# Patient Record
Sex: Male | Born: 1985 | Race: Black or African American | Hispanic: No | Marital: Single | Smoking: Former smoker
Health system: Southern US, Community
[De-identification: ages and names within clinical notes are randomized; demographics above are authoritative.]

## PROBLEM LIST (undated history)

## (undated) DIAGNOSIS — I10 Essential (primary) hypertension: Secondary | ICD-10-CM

## (undated) DIAGNOSIS — Z720 Tobacco use: Secondary | ICD-10-CM

## (undated) DIAGNOSIS — J189 Pneumonia, unspecified organism: Secondary | ICD-10-CM

## (undated) DIAGNOSIS — J40 Bronchitis, not specified as acute or chronic: Secondary | ICD-10-CM

---
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
## 2006-07-07 ENCOUNTER — Emergency Department: Payer: Self-pay | Admitting: Emergency Medicine

## 2006-07-08 ENCOUNTER — Other Ambulatory Visit: Payer: Self-pay

## 2007-05-26 ENCOUNTER — Emergency Department: Payer: Self-pay | Admitting: Unknown Physician Specialty

## 2007-08-08 ENCOUNTER — Emergency Department: Payer: Self-pay

## 2008-03-13 ENCOUNTER — Emergency Department: Payer: Self-pay | Admitting: Emergency Medicine

## 2009-09-21 ENCOUNTER — Emergency Department: Payer: Self-pay | Admitting: Emergency Medicine

## 2011-01-05 ENCOUNTER — Emergency Department: Payer: Self-pay | Admitting: Emergency Medicine

## 2014-07-03 ENCOUNTER — Emergency Department: Payer: Self-pay | Admitting: Emergency Medicine

## 2015-11-09 ENCOUNTER — Emergency Department
Admission: EM | Admit: 2015-11-09 | Discharge: 2015-11-09 | Disposition: A | Discharge status: Home / Self Care | Payer: Self-pay | Attending: Emergency Medicine | Admitting: Emergency Medicine

## 2015-11-09 ENCOUNTER — Emergency Department: Payer: Self-pay

## 2015-11-09 ENCOUNTER — Encounter: Payer: Self-pay | Admitting: Emergency Medicine

## 2015-11-09 DIAGNOSIS — R61 Generalized hyperhidrosis: Secondary | ICD-10-CM | POA: Insufficient documentation

## 2015-11-09 DIAGNOSIS — J069 Acute upper respiratory infection, unspecified: Secondary | ICD-10-CM | POA: Insufficient documentation

## 2015-11-09 DIAGNOSIS — E669 Obesity, unspecified: Secondary | ICD-10-CM | POA: Insufficient documentation

## 2015-11-09 DIAGNOSIS — F17200 Nicotine dependence, unspecified, uncomplicated: Secondary | ICD-10-CM | POA: Insufficient documentation

## 2015-11-09 DIAGNOSIS — R55 Syncope and collapse: Secondary | ICD-10-CM | POA: Insufficient documentation

## 2015-11-09 HISTORY — DX: Bronchitis, not specified as acute or chronic: J40

## 2015-11-09 LAB — CBC
HCT: 49.8 % (ref 40.0–52.0)
Hemoglobin: 16 g/dL (ref 13.0–18.0)
MCH: 27.3 pg (ref 26.0–34.0)
MCHC: 32.1 g/dL (ref 32.0–36.0)
MCV: 85.1 fL (ref 80.0–100.0)
PLATELETS: 254 10*3/uL (ref 150–440)
RBC: 5.85 MIL/uL (ref 4.40–5.90)
RDW: 13.3 % (ref 11.5–14.5)
WBC: 6.8 10*3/uL (ref 3.8–10.6)

## 2015-11-09 LAB — BASIC METABOLIC PANEL
Anion gap: 3 — ABNORMAL LOW (ref 5–15)
BUN: 13 mg/dL (ref 6–20)
CHLORIDE: 106 mmol/L (ref 101–111)
CO2: 29 mmol/L (ref 22–32)
CREATININE: 1.16 mg/dL (ref 0.61–1.24)
Calcium: 9 mg/dL (ref 8.9–10.3)
GFR calc non Af Amer: >60 mL/min (ref >60)
Glucose, Bld: 96 mg/dL (ref 65–99)
Potassium: 3.8 mmol/L (ref 3.5–5.1)
SODIUM: 138 mmol/L (ref 135–145)

## 2015-11-09 MED ORDER — BENZONATATE 100 MG PO CAPS: 100.0000 mg | ORAL_CAPSULE | Freq: Three times a day (TID) | ORAL | Status: DC | PRN

## 2015-11-09 NOTE — ED Provider Notes (Signed)
[facility] Emergency Department Provider Note  ____________________________________________  Time seen: Approximately 8:43 PM  I have reviewed the triage vital signs and the nursing notes.   HISTORY  Chief Complaint Loss of Consciousness    HPI Ryan Maxwell is a 30 y.o. male , otherwise healthy, presenting with cough and cold symptoms and syncope. Patient reports that for the past 3 days he has had a cough productive of yellow sputum associated with rhinorrhea. He denies any ear pain, throat pain, or fever. Today, he had a prolonged coughing spasm followed by some mild diaphoresis and a brief syncopal episode. He did not have any tonic-clonic activity, urinary incontinence, or confusion when he regain responsiveness. He denies any chest pain or shortness of breath. He denies any calf pain. At this time, he feels back to his baseline except for his congestion.   Past Medical History  Diagnosis Date  . Bronchitis     There are no active problems to display for this patient.   History reviewed. No pertinent past surgical history.  No current outpatient prescriptions on file.  Allergies Dayquil  No family history on file.  Social History Social History  Substance Use Topics  . Smoking status: Current Every Day Smoker  . Smokeless tobacco: None  . Alcohol Use: No    Review of Systems Constitutional: No fever. Positive syncope. Eyes: No visual changes. ENT: No sore throat. No ear pain. Cardiovascular: Denies chest pain, palpitations. Respiratory: Denies shortness of breath.  Positive productive cough. Gastrointestinal: No abdominal pain.  No nausea, no vomiting.  No diarrhea.  No constipation. Genitourinary: Negative for dysuria. Musculoskeletal: Negative for back pain. No calf tenderness. Skin: Negative for rash. Neurological: Negative for headaches, focal weakness or numbness.  10-point ROS otherwise  negative.  ____________________________________________   PHYSICAL EXAM:  VITAL SIGNS: ED Triage Vitals  Enc Vitals Group     BP 11/09/15 1847 126/80 mmHg     Pulse Rate 11/09/15 1847 102     Resp 11/09/15 1847 22     Temp 11/09/15 1847 98.2 F (36.8 C)     Temp Source 11/09/15 1847 Oral     SpO2 11/09/15 1847 97 %     Weight 11/09/15 1847 264 lb (119.75 kg)     Height 11/09/15 1847  (1.803 m)     Head Cir --      Peak Flow --      Pain Score 11/09/15 1848 8     Pain Loc --      Pain Edu? --      Excl. in GC? --     Constitutional: Patient is alert and oriented and answering questions appropriate. He was disheveled and has poor hygiene but is acting appropriately.  Eyes: Conjunctivae are normal.  EOMI. no eye discharge. Head: Atraumatic. Nose: Positive congestion without rhinorrhea. Mouth/Throat: Mucous membranes are moist. No posterior pharyngeal erythema, tonsillar swelling or exudate. Neck: No stridor.  Supple.  No JVD. Cardiovascular: Normal rate, regular rhythm. No murmurs, rubs or gallops.  Respiratory: Normal respiratory effort.  No retractions. Lungs CTAB.  No wheezes, rales or ronchi. Gastrointestinal: Obese. Soft and nontender. No distention. No peritoneal signs. Musculoskeletal: No LE edema. No palpable cords or tenderness to palpation in the calves. Negative Homans sign. Neurologic:  Alert and oriented 3. Face and smile are symmetric. EOMI and PERRLA. Moves all extremity's well. Normal gait without ataxia.  Skin:  Skin is warm, dry and intact. No rash noted. Psychiatric: Mood  and affect are normal. Speech and behavior are normal.  Normal judgement.  ____________________________________________   LABS (all labs ordered are listed, but only abnormal results are displayed)  Labs Reviewed  BASIC METABOLIC PANEL - Abnormal; Notable for the following:    Anion gap 3 (*)    All other components within normal limits  CBC  URINALYSIS COMPLETEWITH  MICROSCOPIC ([facility] ONLY)   ____________________________________________  EKG  ED ECG REPORT I, Rockne Menghini, the attending physician, personally viewed and interpreted this ECG.   Date: 11/09/2015  EKG Time: 1846  Rate: 103  Rhythm: sinus tachycardia  Axis: Normal  Intervals:none  ST&T Change: No ST elevation or ischemic changes. No evidence of prolonged QTC. No evidence of LVH.  ____________________________________________  RADIOLOGY  Dg Chest 2 View  11/09/2015  CLINICAL DATA:  Cough and congestion for 1 week. Chest pain when coughing. EXAM: CHEST  2 VIEW COMPARISON:  01/05/2011. FINDINGS: The heart size and mediastinal contours are within normal limits. Both lungs are clear. The visualized skeletal structures are unremarkable. IMPRESSION: No active cardiopulmonary disease. Electronically Signed   By: Kennith Center M.D.   On: 11/09/2015 19:09    ____________________________________________   PROCEDURES  Procedure(s) performed: no  Critical Care performed: No ____________________________________________   INITIAL IMPRESSION / ASSESSMENT AND PLAN / ED COURSE  Pertinent labs & imaging results that were available during my care of the patient were reviewed by me and considered in my medical decision making (see chart for details).  30 y.o. male with a syncopal episode after a long tussive episode. The patient has normal vital signs at this time, without any abnormalities on his EKG. He also is nontoxic and afebrile. His labs do not show any abnormal electrolytes are significant anemia. His chest x-ray does not show pneumonia or other cardiopulmonary abnormality. At this time I will plan to discharge the patient home and have him drink plenty of fluids, follow-up with his primary care physician. He understands return precautions as well as follow-up instructions.  ____________________________________________  FINAL CLINICAL IMPRESSION(S) / ED DIAGNOSES  Final  diagnoses:  URI (upper respiratory infection)  Syncope, unspecified syncope type      NEW MEDICATIONS STARTED DURING THIS VISIT:  New Prescriptions   No medications on file     Rockne Menghini, MD 11/09/15 2047

## 2015-11-09 NOTE — ED Notes (Signed)
Brought in via family   S/p syncopal episode    Then developed some chest pain   Pt is diaphoretic on arrival..chest congestion and cough prior to episode

## 2015-11-09 NOTE — Discharge Instructions (Signed)
These drink plenty of fluids and eat small regular meals or other day.  Return to the emergency department if you develop chest pain, palpitations, shortness of breath, lightheadedness, fever, or any other symptoms concerning to you.

## 2016-02-12 ENCOUNTER — Encounter: Payer: Self-pay | Admitting: *Deleted

## 2016-02-12 ENCOUNTER — Emergency Department
Admission: EM | Admit: 2016-02-12 | Discharge: 2016-02-12 | Disposition: A | Discharge status: Home / Self Care | Payer: Self-pay | Attending: Emergency Medicine | Admitting: Emergency Medicine

## 2016-02-12 ENCOUNTER — Emergency Department: Payer: Self-pay

## 2016-02-12 DIAGNOSIS — R05 Cough: Secondary | ICD-10-CM

## 2016-02-12 DIAGNOSIS — R079 Chest pain, unspecified: Secondary | ICD-10-CM

## 2016-02-12 DIAGNOSIS — F17200 Nicotine dependence, unspecified, uncomplicated: Secondary | ICD-10-CM | POA: Insufficient documentation

## 2016-02-12 DIAGNOSIS — R0789 Other chest pain: Secondary | ICD-10-CM | POA: Insufficient documentation

## 2016-02-12 DIAGNOSIS — R059 Cough, unspecified: Secondary | ICD-10-CM

## 2016-02-12 DIAGNOSIS — F1290 Cannabis use, unspecified, uncomplicated: Secondary | ICD-10-CM | POA: Insufficient documentation

## 2016-02-12 LAB — BASIC METABOLIC PANEL
Anion gap: 8 (ref 5–15)
BUN: 12 mg/dL (ref 6–20)
CHLORIDE: 107 mmol/L (ref 101–111)
CO2: 26 mmol/L (ref 22–32)
Calcium: 8.8 mg/dL — ABNORMAL LOW (ref 8.9–10.3)
Creatinine, Ser: 0.93 mg/dL (ref 0.61–1.24)
GFR calc Af Amer: >60 mL/min (ref >60)
Glucose, Bld: 96 mg/dL (ref 65–99)
POTASSIUM: 3.7 mmol/L (ref 3.5–5.1)
SODIUM: 141 mmol/L (ref 135–145)

## 2016-02-12 LAB — CBC
HEMATOCRIT: 42.8 % (ref 40.0–52.0)
HEMOGLOBIN: 14.1 g/dL (ref 13.0–18.0)
MCH: 27.2 pg (ref 26.0–34.0)
MCHC: 32.8 g/dL (ref 32.0–36.0)
MCV: 82.8 fL (ref 80.0–100.0)
Platelets: 228 10*3/uL (ref 150–440)
RBC: 5.17 MIL/uL (ref 4.40–5.90)
RDW: 13 % (ref 11.5–14.5)
WBC: 11.4 10*3/uL — AB (ref 3.8–10.6)

## 2016-02-12 LAB — TROPONIN I: Troponin I: <0.03 ng/mL (ref <0.031)

## 2016-02-12 MED ORDER — BENZONATATE 100 MG PO CAPS: 100.0000 mg | ORAL_CAPSULE | Freq: Four times a day (QID) | ORAL | Status: DC | PRN

## 2016-02-12 MED ORDER — KETOROLAC TROMETHAMINE 30 MG/ML IJ SOLN
30.0000 mg | Freq: Once | INTRAMUSCULAR | Status: AC
  Administered 2016-02-12: 30 mg via INTRAVENOUS
  Filled 2016-02-12: qty 1

## 2016-02-12 MED ORDER — KETOROLAC TROMETHAMINE 60 MG/2ML IM SOLN: 60.0000 mg | Freq: Once | INTRAMUSCULAR | Status: DC

## 2016-02-12 MED ORDER — IBUPROFEN 600 MG PO TABS: 600.0000 mg | ORAL_TABLET | Freq: Three times a day (TID) | ORAL | Status: DC | PRN

## 2016-02-12 NOTE — ED Notes (Signed)
Pt c/o cough starting last night. Pt states worsening today. Pt states chest pain starting at 1430, worsening w/ cough and over time.

## 2016-02-12 NOTE — ED Provider Notes (Signed)
[facility] Emergency Department Provider Note    ____________________________________________  Time seen: ~0315  I have reviewed the triage vital signs and the nursing notes.   HISTORY  Chief Complaint Pleurisy and Cough   History limited by: Not Limited   HPI Ryan Maxwell is a 30 y.o. male who comes into the emergency department today because of concerns for chest pain. He states it started today. It is located in the central chest. States it feels eczema descending with a hammer. He states it is worse when he has a cough. The cough again started today. He states the cough is productive of yellow phlegm. He has not noticed any associated fevers.   Past Medical History  Diagnosis Date  . Bronchitis     There are no active problems to display for this patient.   History reviewed. No pertinent past surgical history.  Current Outpatient Rx  Name  Route  Sig  Dispense  Refill  . benzonatate (TESSALON PERLES) 100 MG capsule   Oral   Take 1 capsule (100 mg total) by mouth 3 (three) times daily as needed for cough.   15 capsule   0     Allergies Dayquil  History reviewed. No pertinent family history.  Social History Social History  Substance Use Topics  . Smoking status: Current Every Day Smoker  . Smokeless tobacco: None  . Alcohol Use: No    Review of Systems  Constitutional: Negative for fever. Cardiovascular: Positive for chest pain. Respiratory: Negative for shortness of breath. Positive for cough Gastrointestinal: Negative for abdominal pain, vomiting and diarrhea. Neurological: Negative for headaches, focal weakness or numbness.  10-point ROS otherwise negative.  ____________________________________________   PHYSICAL EXAM:  VITAL SIGNS: ED Triage Vitals  Enc Vitals Group     BP 02/12/16 0045 154/98 mmHg     Pulse Rate 02/12/16 0045 92     Resp 02/12/16 0045 16     Temp 02/12/16 0045 99.3 F (37.4 C)     Temp  Source 02/12/16 0045 Oral     SpO2 02/12/16 0045 98 %     Weight 02/12/16 0045 247 lb (112.038 kg)     Height 02/12/16 0045 6' (1.829 m)     Head Cir --      Peak Flow --      Pain Score 02/12/16 0039 8   Constitutional: Alert and oriented. Well appearing and in no distress.Occasional cough Eyes: Conjunctivae are normal. PERRL. Normal extraocular movements. ENT   Head: Normocephalic and atraumatic.   Nose: No congestion/rhinnorhea.   Mouth/Throat: Mucous membranes are moist.   Neck: No stridor. Hematological/Lymphatic/Immunilogical: No cervical lymphadenopathy. Cardiovascular: Normal rate, regular rhythm.  No murmurs, rubs, or gallops. Tender to palpation over the sternum Respiratory: Normal respiratory effort without tachypnea nor retractions. Breath sounds are clear and equal bilaterally. No wheezes/rales/rhonchi. Occasional cough Gastrointestinal: Soft and nontender. No distention. Genitourinary: Deferred Musculoskeletal: Normal range of motion in all extremities. No joint effusions.  No lower extremity tenderness nor edema. Neurologic:  Normal speech and language. No gross focal neurologic deficits are appreciated.  Skin:  Skin is warm, dry and intact. No rash noted. Psychiatric: Mood and affect are normal. Speech and behavior are normal. Patient exhibits appropriate insight and judgment.  ____________________________________________    LABS (pertinent positives/negatives)  Labs Reviewed  BASIC METABOLIC PANEL - Abnormal; Notable for the following:    Calcium 8.8 (*)    All other components within normal limits  CBC - Abnormal;  Notable for the following:    WBC 11.4 (*)    All other components within normal limits  TROPONIN I     ____________________________________________   EKG  I, Phineas Semen, attending physician, personally viewed and interpreted this EKG  EKG Time: 0043 Rate: 92 Rhythm: normal sinus rhythm Axis: normal Intervals: qtc  437 QRS: narrow ST changes: no st elevation Impression: normal ekg  ____________________________________________    RADIOLOGY  CXR IMPRESSION: No active cardiopulmonary disease.  ____________________________________________   PROCEDURES  Procedure(s) performed: None  Critical Care performed: No  ____________________________________________   INITIAL IMPRESSION / ASSESSMENT AND PLAN / ED COURSE  Pertinent labs & imaging results that were available during my care of the patient were reviewed by me and considered in my medical decision making (see chart for details).  Patient presented to the emergency department today because of concerns for chest pain and cough. On exam patient had an occasional cough. He did have tenderness over the sternum. EKG blood work and chest x-ray without concerning findings. Troponin was negative. This pain had been going on for greater than 8 hours given that the symptoms started this morning. Given the negative troponin and other workup by extremely doubt ACS. Additionally doubt PE given lack of significant risk factors. This point will treat for cough and chest wall pain. Will give patient primary care follow-up.  ____________________________________________   FINAL CLINICAL IMPRESSION(S) / ED DIAGNOSES  Final diagnoses:  Chest pain, unspecified chest pain type  Cough     Phineas Semen, MD 02/12/16 [phone]

## 2016-02-12 NOTE — ED Notes (Signed)
Pt discharged to home.  Family member driving.  Discharge instructions reviewed.  Verbalized understanding.  No questions or concerns at this time.  Teach back verified.  Pt in NAD.  No items left in ED.   

## 2016-02-12 NOTE — Discharge Instructions (Signed)
Please seek medical attention for any high fevers, chest pain, shortness of breath, change in behavior, persistent vomiting, bloody stool or any other new or concerning symptoms. ° ° °Chest Wall Pain °Chest wall pain is pain in or around the bones and muscles of your chest. Sometimes, an injury causes this pain. Sometimes, the cause may not be known. This pain may take several weeks or longer to get better. °HOME CARE INSTRUCTIONS  °Pay attention to any changes in your symptoms. Take these actions to help with your pain:  °· Rest as told by your health care provider.   °· Avoid activities that cause pain. These include any activities that use your chest muscles or your abdominal and side muscles to lift heavy items.    °· If directed, apply ice to the painful area: °¨ Put ice in a plastic bag. °¨ Place a towel between your skin and the bag. °¨ Leave the ice on for 20 minutes, 2-3 times per day. °· Take over-the-counter and prescription medicines only as told by your health care provider. °· Do not use tobacco products, including cigarettes, chewing tobacco, and e-cigarettes. If you need help quitting, ask your health care provider. °· Keep all follow-up visits as told by your health care provider. This is important. °SEEK MEDICAL CARE IF: °· You have a fever. °· Your chest pain becomes worse. °· You have new symptoms. °SEEK IMMEDIATE MEDICAL CARE IF: °· You have nausea or vomiting. °· You feel sweaty or light-headed. °· You have a cough with phlegm (sputum) or you cough up blood. °· You develop shortness of breath. °  °This information is not intended to replace advice given to you by your health care provider. Make sure you discuss any questions you have with your health care provider. °  °Document Released: 09/24/2005 Document Revised: 06/15/2015 Document Reviewed: 12/20/2014 °Elsevier Interactive Patient Education ©2016 [employer] ° °

## 2016-06-25 ENCOUNTER — Emergency Department: Payer: PRIVATE HEALTH INSURANCE

## 2016-06-25 ENCOUNTER — Emergency Department
Admission: EM | Admit: 2016-06-25 | Discharge: 2016-06-25 | Disposition: A | Discharge status: Home / Self Care | Payer: PRIVATE HEALTH INSURANCE | Attending: Emergency Medicine | Admitting: Emergency Medicine

## 2016-06-25 ENCOUNTER — Encounter: Payer: Self-pay | Admitting: Emergency Medicine

## 2016-06-25 DIAGNOSIS — Z79899 Other long term (current) drug therapy: Secondary | ICD-10-CM | POA: Diagnosis not present

## 2016-06-25 DIAGNOSIS — J40 Bronchitis, not specified as acute or chronic: Secondary | ICD-10-CM | POA: Diagnosis not present

## 2016-06-25 DIAGNOSIS — R1032 Left lower quadrant pain: Secondary | ICD-10-CM | POA: Diagnosis not present

## 2016-06-25 DIAGNOSIS — R079 Chest pain, unspecified: Secondary | ICD-10-CM

## 2016-06-25 DIAGNOSIS — F17210 Nicotine dependence, cigarettes, uncomplicated: Secondary | ICD-10-CM | POA: Diagnosis not present

## 2016-06-25 DIAGNOSIS — Z791 Long term (current) use of non-steroidal anti-inflammatories (NSAID): Secondary | ICD-10-CM | POA: Insufficient documentation

## 2016-06-25 LAB — CBC
HCT: 44.9 % (ref 40.0–52.0)
HEMOGLOBIN: 15 g/dL (ref 13.0–18.0)
MCH: 28.7 pg (ref 26.0–34.0)
MCHC: 33.3 g/dL (ref 32.0–36.0)
MCV: 86.2 fL (ref 80.0–100.0)
Platelets: 215 10*3/uL (ref 150–440)
RBC: 5.21 MIL/uL (ref 4.40–5.90)
RDW: 13.3 % (ref 11.5–14.5)
WBC: 10.1 10*3/uL (ref 3.8–10.6)

## 2016-06-25 LAB — TROPONIN I
Troponin I: <0.03 ng/mL (ref <0.03)
Troponin I: <0.03 ng/mL (ref <0.03)

## 2016-06-25 LAB — HEPATIC FUNCTION PANEL
ALBUMIN: 3.6 g/dL (ref 3.5–5.0)
ALT: 25 U/L (ref 17–63)
AST: 20 U/L (ref 15–41)
Alkaline Phosphatase: 72 U/L (ref 38–126)
BILIRUBIN TOTAL: 0.7 mg/dL (ref 0.3–1.2)
Total Protein: 7.1 g/dL (ref 6.5–8.1)

## 2016-06-25 LAB — BASIC METABOLIC PANEL
ANION GAP: 6 (ref 5–15)
BUN: 11 mg/dL (ref 6–20)
CHLORIDE: 106 mmol/L (ref 101–111)
CO2: 28 mmol/L (ref 22–32)
Calcium: 8.8 mg/dL — ABNORMAL LOW (ref 8.9–10.3)
Creatinine, Ser: 0.86 mg/dL (ref 0.61–1.24)
GFR calc non Af Amer: >60 mL/min (ref >60)
Glucose, Bld: 90 mg/dL (ref 65–99)
POTASSIUM: 3.7 mmol/L (ref 3.5–5.1)
Sodium: 140 mmol/L (ref 135–145)

## 2016-06-25 MED ORDER — AZITHROMYCIN 250 MG PO TABS: ORAL_TABLET | ORAL | 0 refills | Status: AC

## 2016-06-25 MED ORDER — MORPHINE SULFATE (PF) 4 MG/ML IV SOLN
4.0000 mg | Freq: Once | INTRAVENOUS | Status: AC
  Administered 2016-06-25: 4 mg via INTRAVENOUS
  Filled 2016-06-25: qty 1

## 2016-06-25 MED ORDER — IOPAMIDOL (ISOVUE-300) INJECTION 61%
30.0000 mL | Freq: Once | INTRAVENOUS | Status: AC | PRN
  Administered 2016-06-25: 30 mL via ORAL

## 2016-06-25 MED ORDER — NITROGLYCERIN 0.4 MG SL SUBL
0.4000 mg | SUBLINGUAL_TABLET | SUBLINGUAL | Status: DC | PRN
  Administered 2016-06-25: 0.4 mg via SUBLINGUAL

## 2016-06-25 MED ORDER — ASPIRIN 81 MG PO CHEW
324.0000 mg | CHEWABLE_TABLET | Freq: Once | ORAL | Status: AC
  Administered 2016-06-25: 324 mg via ORAL

## 2016-06-25 MED ORDER — KETOROLAC TROMETHAMINE 30 MG/ML IJ SOLN
30.0000 mg | Freq: Once | INTRAMUSCULAR | Status: AC
  Administered 2016-06-25: 30 mg via INTRAVENOUS
  Filled 2016-06-25: qty 1

## 2016-06-25 MED ORDER — IOPAMIDOL (ISOVUE-300) INJECTION 61%
100.0000 mL | Freq: Once | INTRAVENOUS | Status: AC | PRN
  Administered 2016-06-25: 100 mL via INTRAVENOUS

## 2016-06-25 MED ORDER — ONDANSETRON HCL 4 MG/2ML IJ SOLN
4.0000 mg | Freq: Once | INTRAMUSCULAR | Status: AC
  Administered 2016-06-25: 4 mg via INTRAVENOUS
  Filled 2016-06-25: qty 2

## 2016-06-25 NOTE — ED Provider Notes (Signed)
[facility] Emergency Department Provider Note   ____________________________________________   First MD Initiated Contact with Patient 06/25/16 [phone]     (approximate)  I have reviewed the triage vital signs and the nursing notes.   HISTORY  Chief Complaint Abdominal Pain; Cough; and Chest Pain    HPI Ryan Maxwell is a 30 y.o. male who comes into the hospital today with chest pain and lower abdomen pain. The patient reports that this pain started this morning around 1:30 or 2 AM. He reports that he was coughing and it woke him up out of sleep and then he developed this pain. He reports it feels like sharp pain in his chest like congestion. He denies any radiation of the pain and reports he's never had this in the past. The patient also has some left midline to left lower quadrant abdominal pain. He has never had pain like this before and rates his pain a 9 out of 10 in intensity.The patient denies any nausea or vomiting. He still having some pain in his left lower quadrant. He reports that his bowel movements have been normal until this evening.   Past Medical History:  Diagnosis Date  . Bronchitis     There are no active problems to display for this patient.   History reviewed. No pertinent surgical history.  Prior to Admission medications   Medication Sig Start Date End Date Taking? Authorizing Provider  benzonatate (TESSALON PERLES) 100 MG capsule Take 1 capsule (100 mg total) by mouth 3 (three) times daily as needed for cough. 11/09/15 11/08/16  Rockne Menghini, MD  benzonatate (TESSALON PERLES) 100 MG capsule Take 1 capsule (100 mg total) by mouth every 6 (six) hours as needed for cough. 02/12/16 02/11/17  Phineas Semen, MD  ibuprofen (ADVIL,MOTRIN) 600 MG tablet Take 1 tablet (600 mg total) by mouth every 8 (eight) hours as needed. 02/12/16   Phineas Semen, MD    Allergies Dayquil [[id number]]  History reviewed. No pertinent  family history.  Social History Social History  Substance Use Topics  . Smoking status: Current Every Day Smoker    Packs/day: 1.00    Types: Cigarettes  . Smokeless tobacco: Never Used  . Alcohol use No    Review of Systems Constitutional: No fever/chills Eyes: No visual changes. ENT: No sore throat. Cardiovascular:  chest pain. Respiratory: Denies shortness of breath. Gastrointestinal: abdominal pain.  No nausea, no vomiting.  No diarrhea.  No constipation. Genitourinary: Negative for dysuria. Musculoskeletal: Negative for back pain. Skin: Negative for rash. Neurological: Negative for headaches, focal weakness or numbness.  10-point ROS otherwise negative.  ____________________________________________   PHYSICAL EXAM:  VITAL SIGNS: ED Triage Vitals  Enc Vitals Group     BP 06/25/16 0455 (!) 173/116     Pulse Rate 06/25/16 0455 93     Resp 06/25/16 0455 16     Temp 06/25/16 0455 98.1 F (36.7 C)     Temp Source 06/25/16 0455 Oral     SpO2 06/25/16 0455 94 %     Weight 06/25/16 0452 250 lb (113.4 kg)     Height 06/25/16 0452 6' (1.829 m)     Head Circumference --      Peak Flow --      Pain Score 06/25/16 0453 9     Pain Loc --      Pain Edu? --      Excl. in GC? --     Constitutional: Alert and oriented. Well appearing  and in Moderate distress. Eyes: Conjunctivae are normal. PERRL. EOMI. Head: Atraumatic. Nose: No congestion/rhinnorhea. Mouth/Throat: Mucous membranes are moist.  Oropharynx non-erythematous. Cardiovascular: Normal rate, regular rhythm. Grossly normal heart sounds.  Good peripheral circulation. Respiratory: Normal respiratory effort.  No retractions. Lungs CTAB. Gastrointestinal: Soft with left lower quadrant tenderness to palpation. No distention. Positive bowel sounds Musculoskeletal: No lower extremity tenderness nor edema.   Neurologic:  Normal speech and language.  Skin:  Skin is warm, dry and intact.  Psychiatric: Mood and affect are  normal.   ____________________________________________   LABS (all labs ordered are listed, but only abnormal results are displayed)  Labs Reviewed  BASIC METABOLIC PANEL - Abnormal; Notable for the following:       Result Value   Calcium 8.8 (*)    All other components within normal limits  CBC  TROPONIN I  HEPATIC FUNCTION PANEL  URINALYSIS COMPLETEWITH MICROSCOPIC ([facility] ONLY)  TROPONIN I   ____________________________________________  EKG  ED ECG REPORT I, Rebecka ApleyWebster,  Namiko Pritts P, the attending physician, personally viewed and interpreted this ECG.   Date: 06/25/2016  EKG Time: 453  Rate: 94  Rhythm: normal sinus rhythm  Axis: Normal  Intervals:none  ST&T Change: No ST segment elevation in leads 1, 2, aVF and V3.  ED ECG REPORT #2 I, Rebecka ApleyWebster,  Meylin Stenzel P, the attending physician, personally viewed and interpreted this ECG.   Date: 06/25/2016  EKG Time: 457  Rate: 94  Rhythm: normal sinus rhythm  Axis: normal  Intervals:none  ST&T Change: No ST segment elevation    ____________________________________________  RADIOLOGY  Chest x-ray CT abdomen and pelvis ____________________________________________   PROCEDURES  Procedure(s) performed: None  Procedures  Critical Care performed: No  ____________________________________________   INITIAL IMPRESSION / ASSESSMENT AND PLAN / ED COURSE  Pertinent labs & imaging results that were available during my care of the patient were reviewed by me and considered in my medical decision making (see chart for details).  This is a 30 year old male who comes into the hospital today with some chest pain and abdominal pain. His initial EKG was concerning for a possible ST segment elevation MI. I did contact the [facility] Eldridge DaceVaranasi and he also looked at the EKG and felt that the patient was not having an ST segment elevation MI. The patient did receive a dose of aspirin as well as a dose of  nitroglycerin. Since that did not help his pain at the give the patient a dose of morphine. I will have the patient receive a CT scan as well as a chest x-ray. I will continue to assess the patient while he is in the emergency department.  Clinical Course  Value Comment By Time  CT Abdomen Pelvis W Contrast No acute process demonstrated in the abdomen or pelvis. No evidence of bowel obstruction or inflammation. Appendix is normal. Nonspecific lymph nodes in the mesenteric probably are reactive.   Rebecka ApleyAllison P Zakaria Fromer, MD 09/18 [phone]  DG Chest Portable 1 View No active disease. Rebecka ApleyAllison P Leam Madero, MD 09/18 [phone]    The patient's CT scan is unremarkable. I am still awaiting the results of the patient's urinalysis and he will also receive a repeat troponin. The patient's care will be signed out to Dr.Varonese troponin follow-up the results of the urinalysis and the troponin. ____________________________________________   FINAL CLINICAL IMPRESSION(S) / ED DIAGNOSES  Final diagnoses:  Left lower quadrant pain  Chest pain, unspecified chest pain type      NEW  MEDICATIONS STARTED DURING THIS VISIT:  New Prescriptions   No medications on file     Note:  This document was prepared using Dragon voice recognition software and may include unintentional dictation errors.    Rebecka Apley, MD 06/25/16 [phone]

## 2016-06-25 NOTE — ED Notes (Signed)
Patient transported to CT 

## 2016-06-25 NOTE — ED Notes (Signed)
Pt returned from CT °

## 2016-06-25 NOTE — ED Notes (Signed)
CT tech, Lillia AbedLindsay notified that pt has finished oral contrast

## 2016-06-25 NOTE — ED Triage Notes (Signed)
Patient states that he developed left side chest pain and a cough that started yesterday. Patient that this morning he stared having severe lower abdominal. Patient states that he has had nausea, vomiting and diarrhea. Denies any urinary symptoms.

## 2016-06-25 NOTE — ED Provider Notes (Signed)
-----------------------------------------   12:10 PM on 06/25/2016 -----------------------------------------   Blood pressure 136/90, pulse 72, temperature 98.1 F (36.7 C), temperature source Oral, resp. rate 14, height 6' (1.829 m), weight 250 lb (113.4 kg), SpO2 95 %.  Assuming care from Dr. Zenda AlpersWebster of [name] is a 30 y.o. male with a chief complaint of Abdominal Pain; Cough; and Chest Pain .   30 year old male with h/o bronchitis and smoking who presented for evaluation of chest pain while coughing and also left lower quadrant abdominal pain. Patient tells me he has had a cough productive of yellow sputum since yesterday and also chills. He does have a productive cough while on reevaluating him. His lungs are clear. Labs within normal limits including CBC, CMP, troponin 2. CT abdomen and pelvis negative for any acute findings. Chest x-ray within normal limits. EKG was discussed with cardiology and felt to be benign. Dr. Zenda AlpersWebster asked me to follow up the results of the LFTs and a troponin which have both returned at this time and are negative. Patient has remained stable. Will be discharged home with z-pack at this time and follow-up with primary care doctor.    Nita Sicklearolina Adaliz Dobis, MD 06/25/16 1214

## 2016-06-25 NOTE — Discharge Instructions (Signed)

## 2016-06-26 ENCOUNTER — Emergency Department: Payer: PRIVATE HEALTH INSURANCE

## 2016-06-26 ENCOUNTER — Encounter: Payer: Self-pay | Admitting: Emergency Medicine

## 2016-06-26 ENCOUNTER — Emergency Department
Admission: EM | Admit: 2016-06-26 | Discharge: 2016-06-26 | Disposition: A | Discharge status: Home / Self Care | Payer: PRIVATE HEALTH INSURANCE | Attending: Emergency Medicine | Admitting: Emergency Medicine

## 2016-06-26 DIAGNOSIS — F17210 Nicotine dependence, cigarettes, uncomplicated: Secondary | ICD-10-CM | POA: Diagnosis not present

## 2016-06-26 DIAGNOSIS — Z791 Long term (current) use of non-steroidal anti-inflammatories (NSAID): Secondary | ICD-10-CM | POA: Diagnosis not present

## 2016-06-26 DIAGNOSIS — J209 Acute bronchitis, unspecified: Secondary | ICD-10-CM | POA: Insufficient documentation

## 2016-06-26 DIAGNOSIS — R079 Chest pain, unspecified: Secondary | ICD-10-CM

## 2016-06-26 DIAGNOSIS — R0789 Other chest pain: Secondary | ICD-10-CM | POA: Diagnosis present

## 2016-06-26 LAB — CBC
HEMATOCRIT: 43.6 % (ref 40.0–52.0)
Hemoglobin: 14.5 g/dL (ref 13.0–18.0)
MCH: 28.4 pg (ref 26.0–34.0)
MCHC: 33.3 g/dL (ref 32.0–36.0)
MCV: 85.1 fL (ref 80.0–100.0)
Platelets: 223 10*3/uL (ref 150–440)
RBC: 5.12 MIL/uL (ref 4.40–5.90)
RDW: 13.2 % (ref 11.5–14.5)
WBC: 6.1 10*3/uL (ref 3.8–10.6)

## 2016-06-26 LAB — BASIC METABOLIC PANEL
Anion gap: 6 (ref 5–15)
BUN: 13 mg/dL (ref 6–20)
CO2: 29 mmol/L (ref 22–32)
Calcium: 9.2 mg/dL (ref 8.9–10.3)
Chloride: 106 mmol/L (ref 101–111)
Creatinine, Ser: 0.95 mg/dL (ref 0.61–1.24)
GFR calc Af Amer: >60 mL/min (ref >60)
GLUCOSE: 83 mg/dL (ref 65–99)
POTASSIUM: 4.2 mmol/L (ref 3.5–5.1)
Sodium: 141 mmol/L (ref 135–145)

## 2016-06-26 LAB — TROPONIN I: Troponin I: <0.03 ng/mL (ref <0.03)

## 2016-06-26 MED ORDER — ALBUTEROL SULFATE (2.5 MG/3ML) 0.083% IN NEBU
2.5000 mg | INHALATION_SOLUTION | Freq: Once | RESPIRATORY_TRACT | Status: AC
  Administered 2016-06-26: 2.5 mg via RESPIRATORY_TRACT
  Filled 2016-06-26: qty 3

## 2016-06-26 MED ORDER — HYDROCODONE-HOMATROPINE 5-1.5 MG/5ML PO SYRP: 5.0000 mL | ORAL_SOLUTION | Freq: Four times a day (QID) | ORAL | 0 refills | Status: DC | PRN

## 2016-06-26 MED ORDER — ALBUTEROL SULFATE HFA 108 (90 BASE) MCG/ACT IN AERS: 1.0000 | INHALATION_SPRAY | Freq: Four times a day (QID) | RESPIRATORY_TRACT | 0 refills | Status: DC | PRN

## 2016-06-26 NOTE — ED Notes (Signed)
Pt states he was diagnosed in ED yesterday with bronchitis, states he is still not feeling well, complaining of chest pain, states he has only taken 1 abx, pt awake and alert in no acute distress

## 2016-06-26 NOTE — Discharge Instructions (Signed)
Please continue antibiotics and take all as directed.  [facility] and to establish care.

## 2016-06-26 NOTE — ED Provider Notes (Signed)
[facility] Emergency Department Provider Note  ____________________________________________  Time seen: Approximately 2:15 PM  I have reviewed the triage vital signs and the nursing notes.   HISTORY  Chief Complaint Bronchitis and Chest Pain    HPI Ryan Maxwell is a 30 y.o. male, NAD, presents to the emergency department with complaint of chest pain with cough after arriving at work this morning.  He describes the pain as tight at rest and sharp when coughing.  Pain is located centrally.  Patient reports cough productive of yellow sputum.  Did have a headache this morning that resolved with ibuprofen.   Patient was seen early yesterday morning in the emergency department for chest and abdominal pain. Had full workup including chest x-ray, CT of the abdomen and pelvis, EKG and blood work which was negative. He was diagnosed with bronchitis and prescribed a Z-pak which he has been taking as prescribed.  Took over-the-counter Robitussin yesterday without relief of cough.  He endorses nasal congestion but no runny nose, ear pain, ear drainage, sinus pressure.  Patient reports smoking 1 pack of cigarettes daily for 10 years, though states he has been smoking less since the cough began.  Patient denies palpitations, shortness of breath, wheezing, fever, chills, fatigue visual changes, loss of appetite, nausea, vomiting, abdominal pain, numbness, weakness, tingling.    Past Medical History:  Diagnosis Date  . Bronchitis     There are no active problems to display for this patient.   History reviewed. No pertinent surgical history.  Prior to Admission medications   Medication Sig Start Date End Date Taking? Authorizing Provider  albuterol (PROVENTIL HFA;VENTOLIN HFA) 108 (90 Base) MCG/ACT inhaler Inhale 1-2 puffs into the lungs every 6 (six) hours as needed for wheezing or shortness of breath. 06/26/16   Jami L Hagler, PA-C  azithromycin (ZITHROMAX Z-PAK) 250 MG  tablet Take 2 tablets (500 mg) on  Day 1,  followed by 1 tablet (250 mg) once daily on Days 2 through 5. 06/25/16 06/30/16  Nita Sickle, MD  benzonatate (TESSALON PERLES) 100 MG capsule Take 1 capsule (100 mg total) by mouth 3 (three) times daily as needed for cough. 11/09/15 11/08/16  Rockne Menghini, MD  benzonatate (TESSALON PERLES) 100 MG capsule Take 1 capsule (100 mg total) by mouth every 6 (six) hours as needed for cough. 02/12/16 02/11/17  Phineas Semen, MD  HYDROcodone-homatropine (HYDROMET) 5-1.5 MG/5ML syrup Take 5 mLs by mouth every 6 (six) hours as needed for cough. 06/26/16   Jami L Hagler, PA-C  ibuprofen (ADVIL,MOTRIN) 600 MG tablet Take 1 tablet (600 mg total) by mouth every 8 (eight) hours as needed. 02/12/16   Phineas Semen, MD    Allergies Dayquil [pseudoephedrine-apap-dm]  History reviewed. No pertinent family history.  Social History Social History  Substance Use Topics  . Smoking status: Current Every Day Smoker    Packs/day: 1.00    Types: Cigarettes  . Smokeless tobacco: Never Used  . Alcohol use No     Review of Systems  Constitutional: No fever/chills, Fatigue Eyes: No visual changes.  ENT: Positive nasal congestion. No nasal drainage, sore throat, ear pain, sinus pressure    Cardiovascular: Positive chest pain with cough. No palpitations. Respiratory: Positive for productive cough of yellow sputum and chest congestion. Positive chest tightness. No shortness of breath, wheezing Gastrointestinal: No abdominal pain.  No nausea, vomiting.   Musculoskeletal: Negative for general myalgias.  Skin: Negative for rash. Neurological: Is for headache but no focal weakness or  numbness. No tingling. No dizziness or lightheadedness.  10-point ROS otherwise negative.  ____________________________________________   PHYSICAL EXAM:  VITAL SIGNS: ED Triage Vitals  Enc Vitals Group     BP 06/26/16 1319 (!) 131/94     Pulse Rate 06/26/16 1317 77     Resp 06/26/16  1317 18     Temp 06/26/16 1317 97.6 F (36.4 C)     Temp Source 06/26/16 1317 Oral     SpO2 06/26/16 1317 95 %     Weight 06/26/16 1319 250 lb (113.4 kg)     Height 06/26/16 1319 6' (1.829 m)     Head Circumference --      Peak Flow --      Pain Score 06/26/16 1319 6     Pain Loc --      Pain Edu? --      Excl. in GC? --      Constitutional: Alert and oriented. Well appearing and in no acute distress. Eyes: Conjunctivae are normal Without icterus or injection. PERRL. EOMI without pain.  Head: Atraumatic. ENT:      Ears: TMs visualized bilaterally without erythema, bulging, effusion, perforation      Nose: Moderate congestion with clear rhinorrhea. Bilateral turbinates are injected.      Mouth/Throat: Mucous membranes are moist.  Pharynx without erythema, swelling, exudate. Uvula is midline. Airways patent.  Neck: No stridor, carotid bruits. No cervical spine tenderness to palpation. Supple with full range of motion. Hematological/Lymphatic/Immunilogical: No cervical lymphadenopathy. Cardiovascular: Normal rate, regular rhythm. Normal S1 and S2.  Good peripheral circulation. Respiratory: Normal respiratory effort without tachypnea or retractions. Lungs CTAB with breath sounds noted in all lung fields. No wheeze, rhonchi, rales. Patient with wet cough. Gastrointestinal: Abdomen is obese but soft and nontender without distention or guarding in all quadrants. Bowel sounds are grossly normal active in all quadrants. Musculoskeletal: No lower extremity tenderness nor edema. Full range of motion of bilateral upper and lower extremities without pain or difficulty. Neurologic:  Normal speech and language. No gross focal neurologic deficits are appreciated.  Skin:  Skin is warm, dry and intact. No rash noted. Psychiatric: Mood and affect are normal. Speech and behavior are normal. Patient exhibits appropriate insight and judgement.   ____________________________________________   LABS (all  labs ordered are listed, but only abnormal results are displayed)  Labs Reviewed  BASIC METABOLIC PANEL  CBC  TROPONIN I   ____________________________________________  EKG  EKG reveals normal sinus rhythm with a ventricular rate of 74 bpm. EKG also reviewed by Dr. Minna Antis. ____________________________________________  RADIOLOGY I have personally viewed and evaluated these images (plain radiographs) as part of my medical decision making, as well as reviewing the written report by the radiologist.  Dg Chest 2 View  Result Date: 06/26/2016 CLINICAL DATA:  Chest pain, sharp and on left side, congestion, recent bronchitis, fever, productive cough, chills, migraine today, smoker, shielded EXAM: CHEST  2 VIEW COMPARISON:  06/25/2016 FINDINGS: Midline trachea.  Normal heart size and mediastinal contours. Sharp costophrenic angles.  No pneumothorax.  Clear lungs. Mild right hemidiaphragm elevation. IMPRESSION: No active cardiopulmonary disease. Electronically Signed   By: Jeronimo Greaves M.D.   On: 06/26/2016 14:00    ____________________________________________    PROCEDURES  Procedure(s) performed: None   Procedures   Medications  albuterol (PROVENTIL) (2.5 MG/3ML) 0.083% nebulizer solution 2.5 mg (2.5 mg Nebulization Given 06/26/16 1622)     ____________________________________________   INITIAL IMPRESSION / ASSESSMENT AND PLAN / ED COURSE  Pertinent labs & imaging results that were available during my care of the patient were reviewed by me and considered in my medical decision making (see chart for details).  Clinical Course  Comment By Time  Patient tolerated albuterol nebulized solution well without any side effects or adverse events. He states that it significantly helped his breathing and chest tightness. We'll plan to discharge the patient with an albuterol inhaler to use at home. We are waiting on troponin and metabolic panel results to be able to discharge  the patient. At this time CBC is within normal limits and improved over the last 48 hours since the patient's ben on antibiotics. Hope PigeonJami L Hagler, PA-C 09/19 1635  Labs were not drawn when the initial orders were signed. Labs were drawn approximately 40 minutes ago and sent to the lab. Hope PigeonJami L Hagler, PA-C 09/19 1638  Metabolic panel returned without abnormality and all values within normal limits. Troponin is negative. EKG is stable as compared EKG completed 2 days ago. All notes, imaging, lab results and EKG from his visit 2 days ago was reviewed as part of my medical decision making. Patient's symptoms are consistent with bronchitis, therefore I will give him an albuterol inhaler, cough syrup to use at home to decrease pain associated with cough. It is reassuring that all of his lab work is negative and within normal limits. Patient is advised [facility] as well as to establish care. Hope PigeonJami L Hagler, PA-C 09/19 1657    Patient's diagnosis is consistent with bronchitis. Patient will be discharged home with prescriptions for Acute bronchitis with nonspecific chest pain. Patient will be discharged with prescriptions for albuterol inhaler and Hydromet cough syrup to take as directed. He is instructed to continue azithromycin as previously directed. [facility] for recheck as well as to establish care for primary care services and evaluation of hypertension. Patient is given ED precautions to return to the ED for any worsening or new symptoms.    ____________________________________________  FINAL CLINICAL IMPRESSION(S) / ED DIAGNOSES  Final diagnoses:  Acute bronchitis, unspecified organism  Nonspecific chest pain      NEW MEDICATIONS STARTED DURING THIS VISIT:  Discharge Medication List as of 06/26/2016  4:56 PM    START taking these medications   Details  albuterol (PROVENTIL HFA;VENTOLIN HFA) 108 (90 Base)  MCG/ACT inhaler Inhale 1-2 puffs into the lungs every 6 (six) hours as needed for wheezing or shortness of breath., Starting Tue 06/26/2016, Print    HYDROcodone-homatropine (HYDROMET) 5-1.5 MG/5ML syrup Take 5 mLs by mouth every 6 (six) hours as needed for cough., Starting Tue 06/26/2016, Print             Ernestene KielJami L SeminoleHagler, PA-C 06/26/16 1728    Sharyn CreamerMark Quale, MD 06/27/16 [phone]

## 2016-06-26 NOTE — ED Triage Notes (Signed)
Pt was seen in the ED yesterday and was diagnosed with bronchitis.  He is wanting to be seen today due to having chest pain when he coughs.  Pt is being treated with a Z-Pack for his bronchitis. Patient coughing up moderate amount of mucous.  Denies fever.

## 2016-06-26 NOTE — ED Notes (Signed)
Provider approved discharge BP

## 2016-07-21 ENCOUNTER — Emergency Department
Admission: EM | Admit: 2016-07-21 | Discharge: 2016-07-21 | Disposition: A | Discharge status: Home / Self Care | Payer: No Typology Code available for payment source | Attending: Emergency Medicine | Admitting: Emergency Medicine

## 2016-07-21 ENCOUNTER — Encounter: Payer: Self-pay | Admitting: Emergency Medicine

## 2016-07-21 DIAGNOSIS — K0889 Other specified disorders of teeth and supporting structures: Secondary | ICD-10-CM | POA: Insufficient documentation

## 2016-07-21 DIAGNOSIS — F17210 Nicotine dependence, cigarettes, uncomplicated: Secondary | ICD-10-CM | POA: Insufficient documentation

## 2016-07-21 MED ORDER — PENICILLIN V POTASSIUM 250 MG PO TABS: 250.0000 mg | ORAL_TABLET | Freq: Four times a day (QID) | ORAL | 0 refills | Status: DC

## 2016-07-21 MED ORDER — TRAMADOL HCL 50 MG PO TABS: 50.0000 mg | ORAL_TABLET | Freq: Four times a day (QID) | ORAL | 0 refills | Status: DC | PRN

## 2016-07-21 NOTE — ED Provider Notes (Signed)
[facility] Emergency Department Provider Note   ____________________________________________    I have reviewed the triage vital signs and the nursing notes.   HISTORY  Chief Complaint Dental Pain     HPI Ryan Maxwell is a 30 y.o. male who presents with complaints of left upper dental pain. He reports this pain comes on intermittently and he has been dealing with over the last 24 hours and it seems to be worse than usual. He has scheduled an appointment with a dentist. No fevers or chills. No facial swelling. No difficulty swallowing. He does smoke cigarettes   Past Medical History:  Diagnosis Date  . Bronchitis     There are no active problems to display for this patient.   History reviewed. No pertinent surgical history.  Prior to Admission medications   Medication Sig Start Date End Date Taking? Authorizing Provider  albuterol (PROVENTIL HFA;VENTOLIN HFA) 108 (90 Base) MCG/ACT inhaler Inhale 1-2 puffs into the lungs every 6 (six) hours as needed for wheezing or shortness of breath. 06/26/16   Jami L Hagler, PA-C  benzonatate (TESSALON PERLES) 100 MG capsule Take 1 capsule (100 mg total) by mouth 3 (three) times daily as needed for cough. 11/09/15 11/08/16  Rockne Menghini, MD  benzonatate (TESSALON PERLES) 100 MG capsule Take 1 capsule (100 mg total) by mouth every 6 (six) hours as needed for cough. 02/12/16 02/11/17  Phineas Semen, MD  HYDROcodone-homatropine (HYDROMET) 5-1.5 MG/5ML syrup Take 5 mLs by mouth every 6 (six) hours as needed for cough. 06/26/16   Jami L Hagler, PA-C  ibuprofen (ADVIL,MOTRIN) 600 MG tablet Take 1 tablet (600 mg total) by mouth every 8 (eight) hours as needed. 02/12/16   Phineas Semen, MD  penicillin v potassium (VEETID) 250 MG tablet Take 1 tablet (250 mg total) by mouth 4 (four) times daily. 07/21/16   Jene Every, MD  traMADol (ULTRAM) 50 MG tablet Take 1 tablet (50 mg total) by mouth every 6 (six) hours as  needed. 07/21/16   Jene Every, MD     Allergies Dayquil [pseudoephedrine-apap-dm]  No family history on file.  Social History Social History  Substance Use Topics  . Smoking status: Current Every Day Smoker    Packs/day: 1.00    Types: Cigarettes  . Smokeless tobacco: Never Used  . Alcohol use No    Review of Systems  Constitutional: No fever/chills  ENT: No Throat swelling   Gastrointestinal:  No nausea, no vomiting.    Skin: Negative for rash. Neurological: Negative for headaches     ____________________________________________   PHYSICAL EXAM:  VITAL SIGNS: ED Triage Vitals  Enc Vitals Group     BP 07/21/16 1455 (!) 140/93     Pulse Rate 07/21/16 1455 79     Resp 07/21/16 1455 18     Temp 07/21/16 1455 98.6 F (37 C)     Temp src --      SpO2 07/21/16 1455 97 %     Weight 07/21/16 1455 246 lb (111.6 kg)     Height 07/21/16 1455 6' (1.829 m)     Head Circumference --      Peak Flow --      Pain Score 07/21/16 1456 10     Pain Loc --      Pain Edu? --      Excl. in GC? --     Constitutional: Alert and oriented. No acute distress. Pleasant and interactive Eyes: Conjunctivae are normal.  Head:  Atraumatic. Nose: No congestion/rhinnorhea. Mouth/Throat: Mucous membranes are moist.  Tenderness to palpation along the upper left back molars with mild erythema and gingivitis noted. No drainable abscess. No brawny induration. Normal pharynx  Respiratory: Normal respiratory effort.  No retractions.   Neurologic:  Normal speech and language. No gross focal neurologic deficits are appreciated.   Skin:  Skin is warm, dry and intact. No rash noted.   ____________________________________________   LABS (all labs ordered are listed, but only abnormal results are displayed)  Labs Reviewed - No data to  display ____________________________________________  EKG   ____________________________________________  RADIOLOGY  None ____________________________________________   PROCEDURES  Procedure(s) performed: No    Critical Care performed: No ____________________________________________   INITIAL IMPRESSION / ASSESSMENT AND PLAN / ED COURSE  Pertinent labs & imaging results that were available during my care of the patient were reviewed by me and considered in my medical decision making (see chart for details).  Patient well-appearing and in no distress. Suspect early dental infection as cause of pain. We'll treat with penicillin and tramadol and have the patient follow up with his dentist. Return precautions discussed.   ____________________________________________   FINAL CLINICAL IMPRESSION(S) / ED DIAGNOSES  Final diagnoses:  Pain, dental      NEW MEDICATIONS STARTED DURING THIS VISIT:  New Prescriptions   PENICILLIN V POTASSIUM (VEETID) 250 MG TABLET    Take 1 tablet (250 mg total) by mouth 4 (four) times daily.   TRAMADOL (ULTRAM) 50 MG TABLET    Take 1 tablet (50 mg total) by mouth every 6 (six) hours as needed.     Note:  This document was prepared using Dragon voice recognition software and may include unintentional dictation errors.    Jene Everyobert Jariya Reichow, MD 07/21/16 [phone]

## 2016-07-21 NOTE — ED Triage Notes (Signed)
L upper dental pain. States has had intermittently in past but today is severe.

## 2016-09-03 ENCOUNTER — Emergency Department (HOSPITAL_COMMUNITY): Payer: PRIVATE HEALTH INSURANCE

## 2016-09-03 ENCOUNTER — Encounter (HOSPITAL_COMMUNITY): Payer: Self-pay | Admitting: Emergency Medicine

## 2016-09-03 ENCOUNTER — Emergency Department (HOSPITAL_COMMUNITY)
Admission: EM | Admit: 2016-09-03 | Discharge: 2016-09-03 | Disposition: A | Discharge status: Home / Self Care | Payer: PRIVATE HEALTH INSURANCE | Attending: Emergency Medicine | Admitting: Emergency Medicine

## 2016-09-03 DIAGNOSIS — R1084 Generalized abdominal pain: Secondary | ICD-10-CM | POA: Insufficient documentation

## 2016-09-03 DIAGNOSIS — Z79899 Other long term (current) drug therapy: Secondary | ICD-10-CM | POA: Diagnosis not present

## 2016-09-03 DIAGNOSIS — F17210 Nicotine dependence, cigarettes, uncomplicated: Secondary | ICD-10-CM | POA: Diagnosis not present

## 2016-09-03 DIAGNOSIS — R112 Nausea with vomiting, unspecified: Secondary | ICD-10-CM | POA: Insufficient documentation

## 2016-09-03 DIAGNOSIS — B349 Viral infection, unspecified: Secondary | ICD-10-CM | POA: Diagnosis not present

## 2016-09-03 DIAGNOSIS — R197 Diarrhea, unspecified: Secondary | ICD-10-CM | POA: Insufficient documentation

## 2016-09-03 LAB — CBC WITH DIFFERENTIAL/PLATELET
Basophils Absolute: 0 10*3/uL (ref 0.0–0.1)
Basophils Relative: 0 %
EOS ABS: 0.1 10*3/uL (ref 0.0–0.7)
EOS PCT: 1 %
HCT: 44.3 % (ref 39.0–52.0)
HEMOGLOBIN: 14.2 g/dL (ref 13.0–17.0)
LYMPHS ABS: 2 10*3/uL (ref 0.7–4.0)
Lymphocytes Relative: 29 %
MCH: 28.1 pg (ref 26.0–34.0)
MCHC: 32.1 g/dL (ref 30.0–36.0)
MCV: 87.7 fL (ref 78.0–100.0)
MONOS PCT: 20 %
Monocytes Absolute: 1.4 10*3/uL — ABNORMAL HIGH (ref 0.1–1.0)
Neutro Abs: 3.5 10*3/uL (ref 1.7–7.7)
Neutrophils Relative %: 50 %
PLATELETS: 269 10*3/uL (ref 150–400)
RBC: 5.05 MIL/uL (ref 4.22–5.81)
RDW: 13.2 % (ref 11.5–15.5)
WBC: 7 10*3/uL (ref 4.0–10.5)

## 2016-09-03 LAB — COMPREHENSIVE METABOLIC PANEL
ALBUMIN: 3.8 g/dL (ref 3.5–5.0)
ALK PHOS: 70 U/L (ref 38–126)
ALT: 29 U/L (ref 17–63)
AST: 31 U/L (ref 15–41)
Anion gap: 6 (ref 5–15)
BUN: 12 mg/dL (ref 6–20)
CALCIUM: 8.5 mg/dL — AB (ref 8.9–10.3)
CO2: 27 mmol/L (ref 22–32)
CREATININE: 0.82 mg/dL (ref 0.61–1.24)
Chloride: 104 mmol/L (ref 101–111)
GFR calc Af Amer: >60 mL/min (ref >60)
GFR calc non Af Amer: >60 mL/min (ref >60)
GLUCOSE: 119 mg/dL — AB (ref 65–99)
Potassium: 4.5 mmol/L (ref 3.5–5.1)
SODIUM: 137 mmol/L (ref 135–145)
Total Bilirubin: 1.3 mg/dL — ABNORMAL HIGH (ref 0.3–1.2)
Total Protein: 7.3 g/dL (ref 6.5–8.1)

## 2016-09-03 LAB — LIPASE, BLOOD: Lipase: 19 U/L (ref 11–51)

## 2016-09-03 MED ORDER — IOPAMIDOL (ISOVUE-300) INJECTION 61%
INTRAVENOUS | Status: AC
  Filled 2016-09-03: qty 100

## 2016-09-03 MED ORDER — ONDANSETRON 4 MG PO TBDP: 4.0000 mg | ORAL_TABLET | Freq: Three times a day (TID) | ORAL | 0 refills | Status: DC | PRN

## 2016-09-03 MED ORDER — ONDANSETRON HCL 4 MG/2ML IJ SOLN
4.0000 mg | Freq: Once | INTRAMUSCULAR | Status: AC
  Administered 2016-09-03: 4 mg via INTRAVENOUS
  Filled 2016-09-03: qty 2

## 2016-09-03 MED ORDER — IOPAMIDOL (ISOVUE-300) INJECTION 61%
INTRAVENOUS | Status: AC
  Filled 2016-09-03: qty 30

## 2016-09-03 MED ORDER — MORPHINE SULFATE (PF) 4 MG/ML IV SOLN
4.0000 mg | Freq: Once | INTRAVENOUS | Status: AC
  Administered 2016-09-03: 4 mg via INTRAVENOUS
  Filled 2016-09-03: qty 1

## 2016-09-03 MED ORDER — SODIUM CHLORIDE 0.9 % IV BOLUS (SEPSIS)
1000.0000 mL | Freq: Once | INTRAVENOUS | Status: AC
  Administered 2016-09-03: 1000 mL via INTRAVENOUS

## 2016-09-03 MED ORDER — LOPERAMIDE HCL 2 MG PO CAPS: 2.0000 mg | ORAL_CAPSULE | Freq: Four times a day (QID) | ORAL | 0 refills | Status: DC | PRN

## 2016-09-03 MED ORDER — IOPAMIDOL (ISOVUE-300) INJECTION 61%
30.0000 mL | Freq: Once | INTRAVENOUS | Status: AC | PRN
  Administered 2016-09-03: 30 mL via ORAL

## 2016-09-03 MED ORDER — IOPAMIDOL (ISOVUE-300) INJECTION 61%
100.0000 mL | Freq: Once | INTRAVENOUS | Status: AC | PRN
  Administered 2016-09-03: 100 mL via INTRAVENOUS

## 2016-09-03 MED ORDER — SODIUM CHLORIDE 0.9 % IJ SOLN
INTRAMUSCULAR | Status: AC
  Filled 2016-09-03: qty 50

## 2016-09-03 NOTE — ED Triage Notes (Signed)
Pt c/o generalized abdominal pain, N/V/D x 2-3 days. Pt sts he is unable to eat and drink "as much as usual." Pt A&Ox4 and ambulatory.Denies urinary symptoms.

## 2016-09-03 NOTE — ED Notes (Signed)
Patient given ginger ale as per patient request for fluid challenge.

## 2016-09-03 NOTE — Discharge Instructions (Signed)
Your blood work is normal, and your CT abdomen does not show any acute abdominal pathology including appendicitis. You likely have a virus causing your abdominal pain, vomiting, and diarrhea. Please take Zofran as needed for relief of your nausea, and Imodium to relieve your diarrhea. Also, please call [facility] to establish care with a PCP for reevaluation of your symptoms, or return to ED for new or worsening symptoms.

## 2016-09-03 NOTE — ED Provider Notes (Signed)
[facility]-EMERGENCY DEPT Provider Note   CSN: [id number] Arrival date & time: 09/03/16  0825     History   Chief Complaint Chief Complaint  Patient presents with  . Abdominal Pain  . Emesis  . Diarrhea    HPI Ryan Maxwell is a 30 y.o. male.  Patient is 30 yo M with no significant PMH, no history of abdominal surgeries, presenting with chief complaint of diffuse generalized abdominal pain, vomiting, and diarrhea starting last night. Pain has been constant, described as sharp, and rated 10/10. He denies any pain radiating to his back. He has not tried anything for relief. He reports subjective fevers and chills, but denies any chest pain, shortness of breath, hematemesis, blood in stools, or dysuria. He has been trying to stay hydrated, but reports loss of appetite. He denies any recent alcohol use.      Past Medical History:  Diagnosis Date  . Bronchitis     There are no active problems to display for this patient.   History reviewed. No pertinent surgical history.     Home Medications    Prior to Admission medications   Medication Sig Start Date End Date Taking? Authorizing Provider  beclomethasone (QVAR) 40 MCG/ACT inhaler Inhale 2 puffs into the lungs daily as needed (for shortness of breath).   Yes Historical Provider, MD  docusate sodium (COLACE) 100 MG capsule Take 200 mg by mouth once.   Yes Historical Provider, MD  ibuprofen (ADVIL,MOTRIN) 200 MG tablet Take 800 mg by mouth every 6 (six) hours as needed for fever, headache, mild pain, moderate pain or cramping.   Yes Historical Provider, MD  albuterol (PROVENTIL HFA;VENTOLIN HFA) 108 (90 Base) MCG/ACT inhaler Inhale 1-2 puffs into the lungs every 6 (six) hours as needed for wheezing or shortness of breath. Patient not taking: Reported on 09/03/2016 06/26/16   Jami L Hagler, PA-C  HYDROcodone-homatropine (HYDROMET) 5-1.5 MG/5ML syrup Take 5 mLs by mouth every 6 (six) hours as needed for cough. Patient not  taking: Reported on 09/03/2016 06/26/16   Jami L Hagler, PA-C  penicillin v potassium (VEETID) 250 MG tablet Take 1 tablet (250 mg total) by mouth 4 (four) times daily. Patient not taking: Reported on 09/03/2016 07/21/16   Jene Everyobert Kinner, MD  traMADol (ULTRAM) 50 MG tablet Take 1 tablet (50 mg total) by mouth every 6 (six) hours as needed. Patient not taking: Reported on 09/03/2016 07/21/16   Jene Everyobert Kinner, MD    Family History No family history on file.  Social History Social History  Substance Use Topics  . Smoking status: Current Every Day Smoker    Packs/day: 1.00    Types: Cigarettes  . Smokeless tobacco: Never Used  . Alcohol use No     Allergies   Dayquil [pseudoephedrine-apap-dm] and Nyquil multi-symptom [pseudoeph-doxylamine-dm-apap]   Review of Systems Review of Systems  All other systems reviewed and are negative.    Physical Exam Updated Vital Signs BP (!) 160/108   Pulse 75   Temp 98.1 F (36.7 C) (Oral)   Resp 18   SpO2 94%   Physical Exam  Constitutional: No distress.  Obese male, appears uncomfortable, but non-toxic.  HENT:  Head: Normocephalic and atraumatic.  Mouth/Throat: Oropharynx is clear and moist.  Eyes: Conjunctivae are normal.  Neck: Normal range of motion.  Cardiovascular: Normal rate, regular rhythm, normal heart sounds and intact distal pulses.   Pulmonary/Chest: Effort normal and breath sounds normal. No respiratory distress.  Abdominal: Soft. Bowel sounds are normal. He  exhibits distension. There is tenderness. There is guarding.  Generalized abdominal TTP, worse in epigastric and RUQ. Unable to accurately assess Murphy's and McBurney's due to guarding. No CVA tenderness. No ecchymosis or skin changes.  Musculoskeletal: Normal range of motion. He exhibits no edema or tenderness.  Neurological: He is alert.  Skin: Skin is warm and dry.  Psychiatric: He has a normal mood and affect.  Nursing note and vitals reviewed.    ED  Treatments / Results  Labs (all labs ordered are listed, but only abnormal results are displayed) Labs Reviewed  COMPREHENSIVE METABOLIC PANEL - Abnormal; Notable for the following:       Result Value   Glucose, Bld 119 (*)    Calcium 8.5 (*)    Total Bilirubin 1.3 (*)    All other components within normal limits  CBC WITH DIFFERENTIAL/PLATELET - Abnormal; Notable for the following:    Monocytes Absolute 1.4 (*)    All other components within normal limits  LIPASE, BLOOD    EKG  EKG Interpretation None       Radiology Ct Abdomen Pelvis W Contrast  Result Date: 09/03/2016 CLINICAL DATA:  Abdominal pain EXAM: CT ABDOMEN AND PELVIS WITH CONTRAST TECHNIQUE: Multidetector CT imaging of the abdomen and pelvis was performed using the standard protocol following bolus administration of intravenous contrast. CONTRAST:  ISOVUE-300 IOPAMIDOL (ISOVUE-300) INJECTION 61%, 30mL ISOVUE-300 IOPAMIDOL (ISOVUE-300) INJECTION 61% COMPARISON:  CT abdomen 06/25/2016 FINDINGS: Lower chest: Mild atelectasis in the lung bases. Mild atelectasis or scarring in the lingula. No infiltrate or effusion. Heart size within normal limits. Hepatobiliary: Gallbladder, bile ducts, and liver are normal. Pancreas: Negative Spleen: Negative Adrenals/Urinary Tract: Negative Stomach/Bowel: Stomach and duodenum normal. Negative for bowel obstruction. Air-fluid levels in nondilated small bowel. Normal appendix. No bowel mass or edema. Vascular/Lymphatic: Mild mesenteric adenitis. Sub cm lymph nodes in the mesentery centrally and in the right lower quadrant slightly more prominent compared with prior study. No retroperitoneal adenopathy. Normal vascular enhancement. Reproductive: Negative Other: No free fluid.  Negative for hernia. Musculoskeletal: Negative IMPRESSION: Mild bibasilar atelectasis. Mild scarring or atelectasis in the lingula. Mild mesenteric adenitis, more prominent compared with the prior study. Lymph nodes are  under 1 cm. These may be reactive related to infection. Air-fluid levels in nondilated small bowel may reflect ileus or enteritis. Normal appendix. Electronically Signed   By: Marlan Palau M.D.   On: 09/03/2016 11:39    Procedures Procedures (including critical care time)  Medications Ordered in ED Medications  ondansetron (ZOFRAN) injection 4 mg (4 mg Intravenous Given 09/03/16 0947)  sodium chloride 0.9 % bolus 1,000 mL (0 mLs Intravenous Stopped 09/03/16 0957)  morphine 4 MG/ML injection 4 mg (4 mg Intravenous Given 09/03/16 0948)  iopamidol (ISOVUE-300) 61 % injection 30 mL (30 mLs Oral Contrast Given 09/03/16 1125)  iopamidol (ISOVUE-300) 61 % injection 100 mL (100 mLs Intravenous Contrast Given 09/03/16 1124)     Initial Impression / Assessment and Plan / ED Course  I have reviewed the triage vital signs and the nursing notes.  Pertinent labs & imaging results that were available during my care of the patient were reviewed by me and considered in my medical decision making (see chart for details).  Clinical Course    Patient is 30 yo M presenting with generalized abdominal pain, vomiting, and diarrhea starting last night. CBC, CMP, lipase unremarkable. Given abdominal TTP with guarding, CT abdomen ordered, which shows mild mesenteric adenitis but no acute abdominal pathology. Given IVF,  Zofran, and morphine for pain control. On reassessment, his pain and nausea resolved, and he tolerated PO challenge. Likely viral illness, and stable for d/c home with prescription Zofran ODT as needed for nausea and Imodium for diarrhea. Advised to [facility] for reevaluation in 2 days, or return to ED for new or worsening symptoms as outlined in d/c summary.  Final Clinical Impressions(s) / ED Diagnoses   Final diagnoses:  Generalized abdominal pain  Nausea vomiting and diarrhea  Viral illness    New Prescriptions Discharge Medication List as of  09/03/2016 12:58 PM    START taking these medications   Details  loperamide (IMODIUM) 2 MG capsule Take 1 capsule (2 mg total) by mouth 4 (four) times daily as needed for diarrhea or loose stools., Starting Mon 09/03/2016, Print    ondansetron (ZOFRAN ODT) 4 MG disintegrating tablet Take 1 tablet (4 mg total) by mouth every 8 (eight) hours as needed for nausea or vomiting., Starting Mon 09/03/2016, Print         Ivonne AndrewDaryl F de SekiuVillier II, [place]PA 09/04/16 2114    Rolan BuccoMelanie Belfi, MD 09/15/16 0011

## 2016-11-20 ENCOUNTER — Emergency Department
Admission: EM | Admit: 2016-11-20 | Discharge: 2016-11-20 | Disposition: A | Discharge status: Home / Self Care | Payer: No Typology Code available for payment source | Attending: Emergency Medicine | Admitting: Emergency Medicine

## 2016-11-20 ENCOUNTER — Encounter: Payer: Self-pay | Admitting: Emergency Medicine

## 2016-11-20 ENCOUNTER — Emergency Department: Payer: No Typology Code available for payment source

## 2016-11-20 DIAGNOSIS — Z79899 Other long term (current) drug therapy: Secondary | ICD-10-CM | POA: Diagnosis not present

## 2016-11-20 DIAGNOSIS — F17210 Nicotine dependence, cigarettes, uncomplicated: Secondary | ICD-10-CM | POA: Insufficient documentation

## 2016-11-20 DIAGNOSIS — J189 Pneumonia, unspecified organism: Secondary | ICD-10-CM

## 2016-11-20 DIAGNOSIS — F1290 Cannabis use, unspecified, uncomplicated: Secondary | ICD-10-CM | POA: Diagnosis not present

## 2016-11-20 DIAGNOSIS — J181 Lobar pneumonia, unspecified organism: Secondary | ICD-10-CM | POA: Diagnosis not present

## 2016-11-20 DIAGNOSIS — R05 Cough: Secondary | ICD-10-CM | POA: Diagnosis present

## 2016-11-20 DIAGNOSIS — J101 Influenza due to other identified influenza virus with other respiratory manifestations: Secondary | ICD-10-CM | POA: Diagnosis not present

## 2016-11-20 LAB — BASIC METABOLIC PANEL
ANION GAP: 7 (ref 5–15)
BUN: 12 mg/dL (ref 6–20)
CALCIUM: 8.5 mg/dL — AB (ref 8.9–10.3)
CO2: 27 mmol/L (ref 22–32)
CREATININE: 1.19 mg/dL (ref 0.61–1.24)
Chloride: 104 mmol/L (ref 101–111)
GFR calc Af Amer: >60 mL/min (ref >60)
GFR calc non Af Amer: >60 mL/min (ref >60)
GLUCOSE: 91 mg/dL (ref 65–99)
Potassium: 3.6 mmol/L (ref 3.5–5.1)
Sodium: 138 mmol/L (ref 135–145)

## 2016-11-20 LAB — CBC
HCT: 42.8 % (ref 40.0–52.0)
Hemoglobin: 14.2 g/dL (ref 13.0–18.0)
MCH: 27.7 pg (ref 26.0–34.0)
MCHC: 33.3 g/dL (ref 32.0–36.0)
MCV: 83.3 fL (ref 80.0–100.0)
PLATELETS: 175 10*3/uL (ref 150–440)
RBC: 5.14 MIL/uL (ref 4.40–5.90)
RDW: 13.2 % (ref 11.5–14.5)
WBC: 3.9 10*3/uL (ref 3.8–10.6)

## 2016-11-20 LAB — INFLUENZA PANEL BY PCR (TYPE A & B)
Influenza A By PCR: NEGATIVE
Influenza B By PCR: POSITIVE — AB

## 2016-11-20 MED ORDER — AZITHROMYCIN 250 MG PO TABS: ORAL_TABLET | ORAL | 0 refills | Status: AC

## 2016-11-20 MED ORDER — OSELTAMIVIR PHOSPHATE 75 MG PO CAPS: 75.0000 mg | ORAL_CAPSULE | Freq: Two times a day (BID) | ORAL | 0 refills | Status: AC

## 2016-11-20 MED ORDER — CEFTRIAXONE SODIUM-DEXTROSE 1-3.74 GM-% IV SOLR
1.0000 g | Freq: Once | INTRAVENOUS | Status: AC
  Administered 2016-11-20: 1 g via INTRAVENOUS
  Filled 2016-11-20: qty 50

## 2016-11-20 MED ORDER — ACETAMINOPHEN 325 MG PO TABS
650.0000 mg | ORAL_TABLET | Freq: Once | ORAL | Status: AC
  Administered 2016-11-20: 650 mg via ORAL
  Filled 2016-11-20: qty 2

## 2016-11-20 MED ORDER — DEXTROSE 5 % IV SOLN: 1.0000 g | Freq: Once | INTRAVENOUS | Status: DC

## 2016-11-20 MED ORDER — OSELTAMIVIR PHOSPHATE 75 MG PO CAPS
75.0000 mg | ORAL_CAPSULE | Freq: Once | ORAL | Status: AC
  Administered 2016-11-20: 75 mg via ORAL
  Filled 2016-11-20: qty 1

## 2016-11-20 MED ORDER — IBUPROFEN 800 MG PO TABS
800.0000 mg | ORAL_TABLET | Freq: Once | ORAL | Status: AC
  Administered 2016-11-20: 800 mg via ORAL
  Filled 2016-11-20: qty 1

## 2016-11-20 MED ORDER — TRAMADOL HCL 50 MG PO TABS
50.0000 mg | ORAL_TABLET | Freq: Once | ORAL | Status: AC
  Administered 2016-11-20: 50 mg via ORAL
  Filled 2016-11-20: qty 1

## 2016-11-20 MED ORDER — IPRATROPIUM-ALBUTEROL 0.5-2.5 (3) MG/3ML IN SOLN
3.0000 mL | Freq: Once | RESPIRATORY_TRACT | Status: AC
  Administered 2016-11-20: 3 mL via RESPIRATORY_TRACT
  Filled 2016-11-20: qty 3

## 2016-11-20 MED ORDER — ALBUTEROL SULFATE HFA 108 (90 BASE) MCG/ACT IN AERS: 2.0000 | INHALATION_SPRAY | Freq: Four times a day (QID) | RESPIRATORY_TRACT | 0 refills | Status: DC | PRN

## 2016-11-20 NOTE — ED Notes (Signed)
Res[place] care from [place]. Pt resting comfortably with family at bedside. Pt denies pain.

## 2016-11-20 NOTE — ED Provider Notes (Signed)
[facility] Emergency Department Provider Note   ____________________________________________   First MD Initiated Contact with Patient 11/20/16 0421     (approximate)  I have reviewed the triage vital signs and the nursing notes.   HISTORY  Chief Complaint Cough    HPI Ryan Maxwell is a 31 y.o. male who comes into the hospital today with body aches, headache, cough, phlegm production and nausea. He reports he feels that he is going to vomit. He's had some chills as well and states that all of the symptoms started yesterday. He reports that he had a fever at home but did not check his temperature. He's been taking ibuprofen and TheraFlu. He reports that he feels worse after taking the medications. He did state he had some chest pain with cough earlier this evening. He reports that he has some shortness of breath and brings up some yellowish sputum. The patient denies any sick contacts. He reports that he did have the flu previously but he wasn't tested for it. The patient rates his pain a 10 out of 10 in intensity.   Past Medical History:  Diagnosis Date  . Bronchitis     There are no active problems to display for this patient.   History reviewed. No pertinent surgical history.  Prior to Admission medications   Medication Sig Start Date End Date Taking? Authorizing Provider  albuterol (PROVENTIL HFA;VENTOLIN HFA) 108 (90 Base) MCG/ACT inhaler Inhale 1-2 puffs into the lungs every 6 (six) hours as needed for wheezing or shortness of breath. Patient not taking: Reported on 09/03/2016 06/26/16   Jami L Hagler, PA-C  albuterol (PROVENTIL HFA;VENTOLIN HFA) 108 (90 Base) MCG/ACT inhaler Inhale 2 puffs into the lungs every 6 (six) hours as needed for wheezing or shortness of breath. 11/20/16   Rebecka Apley, MD  azithromycin (ZITHROMAX Z-PAK) 250 MG tablet Take 2 tablets (500 mg) on  Day 1,  followed by 1 tablet (250 mg) once daily on Days 2 through 5.  11/20/16 11/25/16  Rebecka Apley, MD  beclomethasone (QVAR) 40 MCG/ACT inhaler Inhale 2 puffs into the lungs daily as needed (for shortness of breath).    Historical Provider, MD  docusate sodium (COLACE) 100 MG capsule Take 200 mg by mouth once.    Historical Provider, MD  ibuprofen (ADVIL,MOTRIN) 200 MG tablet Take 800 mg by mouth every 6 (six) hours as needed for fever, headache, mild pain, moderate pain or cramping.    Historical Provider, MD  loperamide (IMODIUM) 2 MG capsule Take 1 capsule (2 mg total) by mouth 4 (four) times daily as needed for diarrhea or loose stools. 09/03/16   Daryl F de Villier II, PA  ondansetron (ZOFRAN ODT) 4 MG disintegrating tablet Take 1 tablet (4 mg total) by mouth every 8 (eight) hours as needed for nausea or vomiting. 09/03/16   Daryl F de Villier II, PA  oseltamivir (TAMIFLU) 75 MG capsule Take 1 capsule (75 mg total) by mouth 2 (two) times daily. 11/20/16 11/25/16  Rebecka Apley, MD    Allergies Dayquil [pseudoephedrine-apap-dm] and Nyquil multi-symptom [pseudoeph-doxylamine-dm-apap]  No family history on file.  Social History Social History  Substance Use Topics  . Smoking status: Current Every Day Smoker    Packs/day: 1.00    Types: Cigarettes  . Smokeless tobacco: Never Used  . Alcohol use No    Review of Systems Constitutional:  fever/chills Eyes: No visual changes. ENT:  sore throat. Cardiovascular: chest pain. Respiratory: cough and  shortness of breath. Gastrointestinal: Nausea No abdominal pain. no vomiting.  No diarrhea.  No constipation. Genitourinary: Negative for dysuria. Musculoskeletal: Negative for back pain. Skin: Negative for rash. Neurological: headache  10-point ROS otherwise negative.  ____________________________________________   PHYSICAL EXAM:  VITAL SIGNS: ED Triage Vitals  Enc Vitals Group     BP 11/20/16 0301 (!) 152/87     Pulse Rate 11/20/16 0301 (!) 113     Resp 11/20/16 0301 (!) 22     Temp  11/20/16 0301 99.9 F (37.7 C)     Temp Source 11/20/16 0301 Oral     SpO2 11/20/16 0301 95 %     Weight 11/20/16 0258 247 lb (112 kg)     Height 11/20/16 0258 6' (1.829 m)     Head Circumference --      Peak Flow --      Pain Score 11/20/16 0301 10     Pain Loc --      Pain Edu? --      Excl. in GC? --     Constitutional: Alert and oriented. Ill appearing and in no acute distress. Eyes: Conjunctivae are normal. PERRL. EOMI. Head: Atraumatic. Nose: No congestion/rhinnorhea. Mouth/Throat: Mucous membranes are moist.  Oropharynx non-erythematous. Cardiovascular:Tachycardia, regular rhythm. Grossly normal heart sounds.  Good peripheral circulation. Respiratory: Normal respiratory effort.  No retractions. Mild expiratory wheezing Gastrointestinal: Soft and nontender. No distention. Positive bowel sounds Musculoskeletal: No lower extremity tenderness nor edema.   Neurologic:  Normal speech and language.  Skin:  Skin is warm, dry and intact.  Psychiatric: Mood and affect are normal.   ____________________________________________   LABS (all labs ordered are listed, but only abnormal results are displayed)  Labs Reviewed  INFLUENZA PANEL BY PCR (TYPE A & B) - Abnormal; Notable for the following:       Result Value   Influenza B By PCR POSITIVE (*)    All other components within normal limits  BASIC METABOLIC PANEL - Abnormal; Notable for the following:    Calcium 8.5 (*)    All other components within normal limits  CULTURE, BLOOD (ROUTINE X 2)  CULTURE, BLOOD (ROUTINE X 2)  CBC   ____________________________________________  EKG  none ____________________________________________  RADIOLOGY  CXR ____________________________________________   PROCEDURES  Procedure(s) performed: None  Procedures  Critical Care performed: No  ____________________________________________   INITIAL IMPRESSION / ASSESSMENT AND PLAN / ED COURSE  Pertinent labs & imaging results  that were available during my care of the patient were reviewed by me and considered in my medical decision making (see chart for details).  This is a 31 year old male who comes into the hospital today with body aches, headache, cough and phlegm production. The patient is feeling nauseous. I did send the patient for a chest x-ray because he did have some mild wheezing and some fever and cough. The chest x-ray does show a linear infiltration with a concern for pneumonia. The patient also flu positive. His significant other reports that she thought he had the flu previously because she has had the flu after he was sick. I will give the patient some ceftriaxone as well as some normal saline and a DuoNeb. The patient also received some Tamiflu. I'll reassess the patient.  Clinical Course as of Nov 20 726  Tue Nov 20, 2016  [id number] Linear infiltration in the left lung base likely representing pneumonia.   DG Chest 2 View [AW]    Clinical Course User Index [AW] Rebecka ApleyAllison P Webster,  MD   The patient's blood work is unremarkable he will be discharged to home.   ____________________________________________   FINAL CLINICAL IMPRESSION(S) / ED DIAGNOSES  Final diagnoses:  Influenza B  Community acquired pneumonia of left lower lobe of lung (HCC)      NEW MEDICATIONS STARTED DURING THIS VISIT:  New Prescriptions   ALBUTEROL (PROVENTIL HFA;VENTOLIN HFA) 108 (90 BASE) MCG/ACT INHALER    Inhale 2 puffs into the lungs every 6 (six) hours as needed for wheezing or shortness of breath.   AZITHROMYCIN (ZITHROMAX Z-PAK) 250 MG TABLET    Take 2 tablets (500 mg) on  Day 1,  followed by 1 tablet (250 mg) once daily on Days 2 through 5.   OSELTAMIVIR (TAMIFLU) 75 MG CAPSULE    Take 1 capsule (75 mg total) by mouth 2 (two) times daily.     Note:  This document was prepared using Dragon voice recognition software and may include unintentional dictation errors.    Rebecka Apley, MD 11/20/16 [phone]

## 2016-11-20 NOTE — ED Notes (Signed)
ED Provider at bedside. 

## 2016-11-20 NOTE — ED Triage Notes (Addendum)
Pt to triage via w/c with no distress noted, mask in place; Pt reports prod cough yellow sputum, congestion, body aches since yesterday; 800mg  ibuprofen taken at 10pm

## 2016-11-25 LAB — CULTURE, BLOOD (ROUTINE X 2)
Culture: NO GROWTH
Culture: NO GROWTH

## 2017-04-03 ENCOUNTER — Emergency Department
Admission: EM | Admit: 2017-04-03 | Discharge: 2017-04-03 | Disposition: A | Discharge status: Home / Self Care | Payer: Self-pay | Attending: Emergency Medicine | Admitting: Emergency Medicine

## 2017-04-03 ENCOUNTER — Encounter: Payer: Self-pay | Admitting: Emergency Medicine

## 2017-04-03 DIAGNOSIS — R21 Rash and other nonspecific skin eruption: Secondary | ICD-10-CM | POA: Insufficient documentation

## 2017-04-03 DIAGNOSIS — F17210 Nicotine dependence, cigarettes, uncomplicated: Secondary | ICD-10-CM | POA: Insufficient documentation

## 2017-04-03 DIAGNOSIS — R030 Elevated blood-pressure reading, without diagnosis of hypertension: Secondary | ICD-10-CM | POA: Insufficient documentation

## 2017-04-03 LAB — GLUCOSE, CAPILLARY: GLUCOSE-CAPILLARY: 96 mg/dL (ref 65–99)

## 2017-04-03 MED ORDER — HYDROXYZINE HCL 25 MG PO TABS: 25.0000 mg | ORAL_TABLET | Freq: Four times a day (QID) | ORAL | 0 refills | Status: DC | PRN

## 2017-04-03 MED ORDER — HYDROXYZINE HCL 25 MG PO TABS: 25.0000 mg | ORAL_TABLET | Freq: Three times a day (TID) | ORAL | 0 refills | Status: DC | PRN

## 2017-04-03 MED ORDER — PREDNISONE 10 MG PO TABS: ORAL_TABLET | ORAL | 0 refills | Status: DC

## 2017-04-03 NOTE — Discharge Instructions (Signed)
[facility] to get information about seeing a provider there. You're blood pressure needs to be rechecked is elevated twice today while taken. Begin taking medication for the rash. Prednisone is 6 tablets today and tapering down one tablet each day. Atarax 25 mg is for itching and should be taken every 6 hours. Atarax can cause drowsiness so do not drive while taking this medication. Follow-up with [facility] if any continued problems with her skin.

## 2017-04-03 NOTE — ED Notes (Addendum)
See triage note  States he developed a rash to both hands and arms  States this has happens yearly  But this am developed rash to face and swelling to eye

## 2017-04-03 NOTE — ED Provider Notes (Signed)
[facility] Emergency Department Provider Note   ____________________________________________   First MD Initiated Contact with Patient 04/03/17 [phone]     (approximate)  I have reviewed the triage vital signs and the nursing notes.   HISTORY  Chief Complaint Rash    HPI Ryan Maxwell is a 31 y.o. male is here with complaint of rash to his hands and now also to his face. 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 He states "I get this every summer". He states there has been some itching especially when he gets hot. He has not used any over-the-counter medication for this. He also is not taking any antihypertensive medication. He has a family history positive for hypertension. He denies any headache, shortness of breath, chest pain. He has not been told that his blood pressure is elevated but also does not see any doctors.   Past Medical History:  Diagnosis Date  . Bronchitis     There are no active problems to display for this patient.   No past surgical history on file.  Prior to Admission medications   Medication Sig Start Date End Date Taking? Authorizing Provider  beclomethasone (QVAR) 40 MCG/ACT inhaler Inhale 2 puffs into the lungs daily as needed (for shortness of breath).    [provider]  hydrOXYzine (ATARAX/VISTARIL) 25 MG tablet Take 1 tablet (25 mg total) by mouth every 6 (six) hours as needed. 04/03/17   Tommi RumpsSummers, Rhonda L, PA-C  ibuprofen (ADVIL,MOTRIN) 200 MG tablet Take 800 mg by mouth every 6 (six) hours as needed for fever, headache, mild pain, moderate pain or cramping.    [provider]  predniSONE (DELTASONE) 10 MG tablet Take 6 tablets  today, on day 2 take 5 tablets, day 3 take 4 tablets, day 4 take 3 tablets, day 5 take  2 tablets and 1 tablet the last day 04/03/17   Tommi RumpsSummers, Rhonda L, PA-C    Allergies Dayquil [pseudoephedrine-apap-dm] and Nyquil multi-symptom  [pseudoeph-doxylamine-dm-apap]  No family history on file.  Social History Social History  Substance Use Topics  . Smoking status: Current Every Day Smoker    Packs/day: 1.00    Types: Cigarettes  . Smokeless tobacco: Never Used  . Alcohol use No    Review of Systems  Constitutional: No fever/chills Cardiovascular: Denies chest pain. Respiratory: Denies shortness of breath. Skin: Positive for skin rash Neurological: Negative for headaches, focal weakness or numbness.   ____________________________________________   PHYSICAL EXAM:  VITAL SIGNS: ED Triage Vitals  Enc Vitals Group     BP 04/03/17 0800 (!) 163/119     Pulse Rate 04/03/17 0800 72     Resp 04/03/17 0800 15     Temp 04/03/17 0800 97.8 F (36.6 C)     Temp Source 04/03/17 0800 Oral     SpO2 04/03/17 0800 98 %     Weight 04/03/17 0808 247 lb (112 kg)     Height 04/03/17 0823 6' (1.829 m)     Head Circumference --      Peak Flow --      Pain Score 04/03/17 0758 0     Pain Loc --      Pain Edu? --      Excl. in GC? --     Constitutional: Alert and oriented. Well appearing and in no acute distress. Eyes: Conjunctivae are normal. PERRL. EOMI. Head: Atraumatic. Neck: No stridor.   Cardiovascular: Normal rate, regular rhythm. Grossly normal heart  sounds.  Good peripheral circulation. Respiratory: Normal respiratory effort.  No retractions. Lungs CTAB. Musculoskeletal: Moves upper and lower extremities without any difficulty. Normal gait was noted. Neurologic:  Normal speech and language. No gross focal neurologic deficits are appreciated. No gait instability. Skin:  Skin is warm, dry and intact. There is a papular rash noted on the hands, face, neck, and some involvement on the lower portion of the lower extremities. Psychiatric: Mood and affect are normal. Speech and behavior are normal.  ____________________________________________   LABS (all labs ordered are listed, but only abnormal results are  displayed)  Labs Reviewed  GLUCOSE, CAPILLARY    PROCEDURES  Procedure(s) performed: None  Procedures  Critical Care performed: No  ____________________________________________   INITIAL IMPRESSION / ASSESSMENT AND PLAN / ED COURSE  Pertinent labs & imaging results that were available during my care of the patient were reviewed by me and considered in my medical decision making (see chart for details).  Blood pressure was rechecked and was still continuing to be elevated. Patient states he believes is because he is a smoker. He was encouraged to [facility] to have his blood pressure rechecked and be treated for hypertension if it continues. We discussed family history also has a part in this. He was given a prescription for prednisone and for Atarax as needed for itching. He is to follow-up with [facility] if any continued rash problems.   ____________________________________________   FINAL CLINICAL IMPRESSION(S) / ED DIAGNOSES  Final diagnoses:  Rash and nonspecific skin eruption  Elevated blood pressure reading      NEW MEDICATIONS STARTED DURING THIS VISIT:  Discharge Medication List as of 04/03/2017  9:32 AM    START taking these medications   Details  predniSONE (DELTASONE) 10 MG tablet Take 6 tablets  today, on day 2 take 5 tablets, day 3 take 4 tablets, day 4 take 3 tablets, day 5 take  2 tablets and 1 tablet the last day, Print         Note:  This document was prepared using Dragon voice recognition software and may include unintentional dictation errors.    Tommi Rumps, PA-C 04/03/17 1609    Sharman Cheek, MD 04/05/17 2317

## 2017-04-03 NOTE — ED Triage Notes (Signed)
Pt reports hx of rash to bilateral hands "as a kid". Reports rash is now on his face "when I get hot and sweat". Pt with minimal swelling to face, reports rash feels like dry skin. Pt a/o, normal and nonlabored respirations noted.

## 2017-04-13 ENCOUNTER — Emergency Department
Admission: EM | Admit: 2017-04-13 | Discharge: 2017-04-13 | Disposition: A | Discharge status: Home / Self Care | Payer: Self-pay | Attending: Emergency Medicine | Admitting: Emergency Medicine

## 2017-04-13 DIAGNOSIS — I10 Essential (primary) hypertension: Secondary | ICD-10-CM | POA: Insufficient documentation

## 2017-04-13 DIAGNOSIS — F17210 Nicotine dependence, cigarettes, uncomplicated: Secondary | ICD-10-CM | POA: Insufficient documentation

## 2017-04-13 MED ORDER — LISINOPRIL 5 MG PO TABS: 5.0000 mg | ORAL_TABLET | Freq: Every day | ORAL | 0 refills | Status: DC

## 2017-04-13 NOTE — ED Notes (Signed)
Patient discharge, prescriptions and follow up information reviewed with patient by ED nursing staff and patient given the opportunity to ask questions pertaining to ED visit and discharge plan of care. Patient advised that should symptoms not continue to improve, resolve entirely, or should new symptoms develop then a follow up visit with their PCP, [facility], or a return visit to the ED may be warranted. Patient verbalized consent and understanding of discharge plan of care including potential need for further evaluation. Patient being discharged in stable condition per attending ED physician on duty.

## 2017-04-13 NOTE — ED Notes (Signed)
Pt states he was told last time he was seen here his BP was elevated, pt denies follow up for this. Pt states he took his BP PTA and it was elevated "213 was top number". Pt states he does not have CP, SHOB or dizziness. Pt states when he was seen here last time for rash on hands he was prescribed prednisone which pt states he took all of. Pt does have rash on left hand at this time which pt reports causes him to itch. Pt reports headache, denies floaters.

## 2017-04-13 NOTE — ED Triage Notes (Signed)
Pt arrives to ED via POV with c/o HBP. Pt denies previous h/x of HTN, does not currently take BP medications. Pt states he was told his BP was high the last time he was here (within the last week or two for a rash) so he checked it today and reports it was "213 over something" around 8pm tonight. Pt denies N/V, no CP or SHOB. Pt is A&O, in NAD; RR even, regular, and unlabored.  Pt denies any other s/x's or c/o's.

## 2017-04-13 NOTE — ED Provider Notes (Signed)
[facility] Emergency Department Provider Note   ____________________________________________    I have reviewed the triage vital signs and the nursing notes.   HISTORY  Chief Complaint Hypertension     HPI Ryan Maxwell is a 31 y.o. male who presents with complaints of high blood pressure. Patient reports he has been feeling "tired" all day and like he has "low energy ". His friend suggested that he check his blood pressure and it was found to be high. Patient denies chest pain or shortness of breath. No nausea or vomiting. He feels quite well. He denies being on blood pressure medications. He does not have a PCP. Denies allergies except  NyQuil   Past Medical History:  Diagnosis Date  . Bronchitis     There are no active problems to display for this patient.   History reviewed. No pertinent surgical history.  Prior to Admission medications   Medication Sig Start Date End Date Taking? Authorizing Provider  beclomethasone (QVAR) 40 MCG/ACT inhaler Inhale 2 puffs into the lungs daily as needed (for shortness of breath).    [provider]  hydrOXYzine (ATARAX/VISTARIL) 25 MG tablet Take 1 tablet (25 mg total) by mouth every 6 (six) hours as needed. 04/03/17   Tommi Rumps, PA-C  ibuprofen (ADVIL,MOTRIN) 200 MG tablet Take 800 mg by mouth every 6 (six) hours as needed for fever, headache, mild pain, moderate pain or cramping.    [provider]  lisinopril (PRINIVIL,ZESTRIL) 5 MG tablet Take 1 tablet (5 mg total) by mouth daily. 04/13/17 04/13/18  Jene Every, MD  predniSONE (DELTASONE) 10 MG tablet Take 6 tablets  today, on day 2 take 5 tablets, day 3 take 4 tablets, day 4 take 3 tablets, day 5 take  2 tablets and 1 tablet the last day 04/03/17   Tommi Rumps, PA-C     Allergies Dayquil [pseudoephedrine-apap-dm] and Nyquil multi-symptom [pseudoeph-doxylamine-dm-apap]  No family history on file.  Social  History Social History  Substance Use Topics  . Smoking status: Current Every Day Smoker    Packs/day: 1.00    Types: Cigarettes  . Smokeless tobacco: Never Used  . Alcohol use No    Review of Systems  Constitutional: No fever/chills Eyes: No visual changes.  ENT: No sore throat. Cardiovascular: Denies chest pain. Respiratory: Denies shortness of breath. Gastrointestinal: No abdominal pain.  No nausea, no vomiting.   Genitourinary: Negative for dysuria. Musculoskeletal: Negative for back pain. Skin: Rash on the hand, healing Neurological: Negative for headaches or weakness   ____________________________________________   PHYSICAL EXAM:  VITAL SIGNS: ED Triage Vitals  Enc Vitals Group     BP 04/13/17 2214 (!) 164/117     Pulse Rate 04/13/17 2214 95     Resp 04/13/17 2214 18     Temp 04/13/17 2214 99.1 F (37.3 C)     Temp Source 04/13/17 2214 Oral     SpO2 04/13/17 2214 99 %     Weight 04/13/17 2215 115.7 kg (255 lb)     Height 04/13/17 2215 1.829 m (6')     Head Circumference --      Peak Flow --      Pain Score 04/13/17 2214 0     Pain Loc --      Pain Edu? --      Excl. in GC? --     Constitutional: Alert and oriented. No acute distress. Pleasant and interactive Eyes: Conjunctivae are normal.   Nose:  No congestion/rhinnorhea. Mouth/Throat: Mucous membranes are moist.    Cardiovascular: Normal rate, regular rhythm. Grossly normal heart sounds.  Good peripheral circulation. Respiratory: Normal respiratory effort.  No retractions. Lungs CTAB. Gastrointestinal: Soft and nontender. No distention.  No CVA tenderness.  Musculoskeletal: No lower extremity tenderness nor edema.  Warm and well perfused Neurologic:  Normal speech and language. No gross focal neurologic deficits are appreciated.  Skin:  Skin is warm, dry and intact. Healing rash right hand Psychiatric: Mood and affect are normal. Speech and behavior are  normal.  ____________________________________________   LABS (all labs ordered are listed, but only abnormal results are displayed)  Labs Reviewed - No data to display ____________________________________________  EKG  ED ECG REPORT I, Jene EveryKINNER, Johnny Latu, the attending physician, personally viewed and interpreted this ECG.  Date: 04/13/2017  Rhythm: normal sinus rhythm QRS Axis: normal Intervals: normal ST/T Wave abnormalities: Nonspecific Narrative Interpretation: unremarkable  ____________________________________________  RADIOLOGY  None ____________________________________________   PROCEDURES  Procedure(s) performed: No    Critical Care performed: No ____________________________________________   INITIAL IMPRESSION / ASSESSMENT AND PLAN / ED COURSE  Pertinent labs & imaging results that were available during my care of the patient were reviewed by me and considered in my medical decision making (see chart for details).  Patient overall well-appearing no acute distress. Blood pressure has improved significant family without intervention however in reviewing his records patient has had high blood pressure twice now in the ED. Discussed with him and he would like to start blood pressure medications. I'll start him on low-dose lisinopril and have him follow up with outpatient facility for recheck. Return precautions discussed, side effects of lisinopril discussed    ____________________________________________   FINAL CLINICAL IMPRESSION(S) / ED DIAGNOSES  Final diagnoses:  Hypertension, unspecified type      NEW MEDICATIONS STARTED DURING THIS VISIT:  Discharge Medication List as of 04/13/2017 11:17 PM    START taking these medications   Details  lisinopril (PRINIVIL,ZESTRIL) 5 MG tablet Take 1 tablet (5 mg total) by mouth daily., Starting Sat 04/13/2017, Until Sun 04/13/2018, Print         Note:  This document was prepared using Dragon voice recognition  software and may include unintentional dictation errors.    Jene EveryKinner, Ryzen Deady, MD 04/13/17 [phone]

## 2017-04-16 ENCOUNTER — Encounter (HOSPITAL_COMMUNITY): Payer: Self-pay | Admitting: Emergency Medicine

## 2017-04-16 ENCOUNTER — Emergency Department (HOSPITAL_COMMUNITY): Payer: Self-pay

## 2017-04-16 ENCOUNTER — Emergency Department (HOSPITAL_COMMUNITY)
Admission: EM | Admit: 2017-04-16 | Discharge: 2017-04-17 | Disposition: A | Discharge status: Home / Self Care | Payer: Self-pay | Attending: Emergency Medicine | Admitting: Emergency Medicine

## 2017-04-16 DIAGNOSIS — L259 Unspecified contact dermatitis, unspecified cause: Secondary | ICD-10-CM | POA: Insufficient documentation

## 2017-04-16 DIAGNOSIS — L309 Dermatitis, unspecified: Secondary | ICD-10-CM

## 2017-04-16 DIAGNOSIS — R109 Unspecified abdominal pain: Secondary | ICD-10-CM

## 2017-04-16 DIAGNOSIS — R079 Chest pain, unspecified: Secondary | ICD-10-CM | POA: Insufficient documentation

## 2017-04-16 DIAGNOSIS — J399 Disease of upper respiratory tract, unspecified: Secondary | ICD-10-CM | POA: Insufficient documentation

## 2017-04-16 DIAGNOSIS — J069 Acute upper respiratory infection, unspecified: Secondary | ICD-10-CM

## 2017-04-16 DIAGNOSIS — R509 Fever, unspecified: Secondary | ICD-10-CM | POA: Insufficient documentation

## 2017-04-16 DIAGNOSIS — F17210 Nicotine dependence, cigarettes, uncomplicated: Secondary | ICD-10-CM | POA: Insufficient documentation

## 2017-04-16 DIAGNOSIS — I10 Essential (primary) hypertension: Secondary | ICD-10-CM | POA: Insufficient documentation

## 2017-04-16 DIAGNOSIS — R1030 Lower abdominal pain, unspecified: Secondary | ICD-10-CM | POA: Insufficient documentation

## 2017-04-16 HISTORY — DX: Essential (primary) hypertension: I10

## 2017-04-16 LAB — CBC
HEMATOCRIT: 48 % (ref 39.0–52.0)
Hemoglobin: 15.3 g/dL (ref 13.0–17.0)
MCH: 28.2 pg (ref 26.0–34.0)
MCHC: 31.9 g/dL (ref 30.0–36.0)
MCV: 88.4 fL (ref 78.0–100.0)
PLATELETS: 245 10*3/uL (ref 150–400)
RBC: 5.43 MIL/uL (ref 4.22–5.81)
RDW: 13.3 % (ref 11.5–15.5)
WBC: 17.3 10*3/uL — AB (ref 4.0–10.5)

## 2017-04-16 LAB — BASIC METABOLIC PANEL
Anion gap: 8 (ref 5–15)
BUN: 11 mg/dL (ref 6–20)
CO2: 26 mmol/L (ref 22–32)
CREATININE: 0.84 mg/dL (ref 0.61–1.24)
Calcium: 9.2 mg/dL (ref 8.9–10.3)
Chloride: 104 mmol/L (ref 101–111)
GFR calc Af Amer: >60 mL/min (ref >60)
GFR calc non Af Amer: >60 mL/min (ref >60)
GLUCOSE: 95 mg/dL (ref 65–99)
Potassium: 4 mmol/L (ref 3.5–5.1)
SODIUM: 138 mmol/L (ref 135–145)

## 2017-04-16 LAB — I-STAT TROPONIN, ED: Troponin i, poc: 0 ng/mL (ref 0.00–0.08)

## 2017-04-16 LAB — I-STAT CG4 LACTIC ACID, ED: LACTIC ACID, VENOUS: 0.94 mmol/L (ref 0.5–1.9)

## 2017-04-16 NOTE — ED Notes (Signed)
Pt also c/o chest pain beginning this afternoon. Chest pain radiates to his back and left arm. Denies n/v, shortness of breath, hx HTN.
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
## 2017-04-16 NOTE — ED Notes (Signed)
Delay explained to pt.  

## 2017-04-17 ENCOUNTER — Emergency Department (HOSPITAL_COMMUNITY): Payer: Self-pay

## 2017-04-17 LAB — RPR: RPR Ser Ql: NONREACTIVE

## 2017-04-17 LAB — HEPATIC FUNCTION PANEL
ALK PHOS: 71 U/L (ref 38–126)
ALT: 36 U/L (ref 17–63)
AST: 26 U/L (ref 15–41)
Albumin: 4 g/dL (ref 3.5–5.0)
BILIRUBIN DIRECT: 0.1 mg/dL (ref 0.1–0.5)
BILIRUBIN INDIRECT: 0.7 mg/dL (ref 0.3–0.9)
BILIRUBIN TOTAL: 0.8 mg/dL (ref 0.3–1.2)
Total Protein: 8.1 g/dL (ref 6.5–8.1)

## 2017-04-17 LAB — I-STAT TROPONIN, ED: TROPONIN I, POC: 0 ng/mL (ref 0.00–0.08)

## 2017-04-17 LAB — HIV ANTIBODY (ROUTINE TESTING W REFLEX): HIV Screen 4th Generation wRfx: NONREACTIVE

## 2017-04-17 LAB — URINALYSIS, ROUTINE W REFLEX MICROSCOPIC
BILIRUBIN URINE: NEGATIVE
Glucose, UA: NEGATIVE mg/dL
Hgb urine dipstick: NEGATIVE
KETONES UR: NEGATIVE mg/dL
Leukocytes, UA: NEGATIVE
NITRITE: NEGATIVE
PROTEIN: NEGATIVE mg/dL
SPECIFIC GRAVITY, URINE: 1.038 — AB (ref 1.005–1.030)
pH: 5 (ref 5.0–8.0)

## 2017-04-17 LAB — LIPASE, BLOOD: LIPASE: 21 U/L (ref 11–51)

## 2017-04-17 LAB — D-DIMER, QUANTITATIVE: D-Dimer, Quant: 0.5 ug/mL-FEU (ref 0.00–0.50)

## 2017-04-17 MED ORDER — IOPAMIDOL (ISOVUE-300) INJECTION 61%
INTRAVENOUS | Status: AC
  Administered 2017-04-17: 100 mL
  Filled 2017-04-17: qty 100

## 2017-04-17 MED ORDER — ACETAMINOPHEN 500 MG PO TABS
1000.0000 mg | ORAL_TABLET | Freq: Once | ORAL | Status: AC
  Administered 2017-04-17: 1000 mg via ORAL
  Filled 2017-04-17: qty 2

## 2017-04-17 MED ORDER — MORPHINE SULFATE (PF) 4 MG/ML IV SOLN
4.0000 mg | Freq: Once | INTRAVENOUS | Status: AC
  Administered 2017-04-17: 4 mg via INTRAVENOUS
  Filled 2017-04-17: qty 1

## 2017-04-17 MED ORDER — TRIAMCINOLONE ACETONIDE 0.1 % EX CREA: 1.0000 "application " | TOPICAL_CREAM | Freq: Two times a day (BID) | CUTANEOUS | 0 refills | Status: DC

## 2017-04-17 MED ORDER — ONDANSETRON HCL 4 MG/2ML IJ SOLN
4.0000 mg | Freq: Once | INTRAMUSCULAR | Status: AC
  Administered 2017-04-17: 4 mg via INTRAVENOUS
  Filled 2017-04-17: qty 2

## 2017-04-17 MED ORDER — SODIUM CHLORIDE 0.9 % IV BOLUS (SEPSIS)
1000.0000 mL | Freq: Once | INTRAVENOUS | Status: AC
  Administered 2017-04-17: 1000 mL via INTRAVENOUS

## 2017-04-17 NOTE — ED Notes (Signed)
Patient transported to CT 

## 2017-04-17 NOTE — Discharge Instructions (Signed)
You may alternate Tylenol 1000 mg every 6 hours as needed for pain and Ibuprofen 800 mg every 8 hours as needed for pain.  Please take Ibuprofen with food.   Your labs today showed elevated white blood cell count which happens with infection. Her chest x-ray shows bronchitis which is usually caused by a virus. No pneumonia. Urine showed no sign of infection. You do not need antibiotics. CT of your abdomen and pelvis was normal. You need close follow-up with a primary care physician and dermatology for the rash on your hands. This may be eczema. We are discharging with a steroid cream.  To find a primary care or specialty doctor please call [phone] or [phone] to access "[place] Find a Doctor Service."  You may also go on the [facility] website at [email or web address]  There are also multiple [facility], [place] and [facility] practices throughout [place] that are frequently accepting new patients. You may find a clinic that is close to your home and contact them.  [facility] -  [address] [place] [phone]   [facility] -  [address] [place] [address] [phone]   [facility] - [address] [place] [address]  [place] [id number] [phone]     [facility]:   [facility]  [address] [place]  [phone]   Dr. Mertha FindersJohn H. Hall Jr, MD  [address]  [phone]  [facility]  [address]  [phone]   [facility]  [address]  [phone]  Janalyn Harderafeen Stuart MD  [address]  [phone]  Hoyle SauerMccoy Bruce  [address]  [phone]  [place] Amy Y MD  [address]  [phone]  [facility]  [address]  [phone]

## 2017-04-17 NOTE — ED Provider Notes (Signed)
By signing my name below, I, Bridgette Habermann, attest that this documentation has been prepared under the direction and in the presence of Tarissa Kerin, Layla Maw, DO. Electronically Signed: Bridgette Habermann, ED Scribe. 04/17/17. 12:43 AM.  TIME SEEN: 12:33 AM  CHIEF COMPLAINT:  Chief Complaint  Patient presents with  . Wound Infection  . Chest Pain   HPI:  HPI Comments: Ryan Maxwell is a 31 y.o. male with h/o HTN and bronchitis, who presents to the Emergency Department complaining of left-sided abdominal pain and chest pain beginning around 3pm this afternoon. Pain is tight and pressure-like in quality. Pt also has associated productive cough. Pt states his pain is worse with palpation. He has not tried any OTC medications PTA. Pt is a smoker.Denies shortness of breath.  Pt is also complaining of multiple open areas of pain to his bilateral hands and forearms onset 3 weeks ago. He has tried Neosporin with no relief. He has been to [facility] ED for this on 04/03/17 and was prescribed Prednisone with some relief. Denies fever, chills, or any other associated symptoms. Pt has no other complaints at this time. Has not seen a dermatologist. No new soaps, lotions, detergents or medications. No drainage from any of these wounds.  ROS: See HPI Constitutional:  fever  Eyes: no drainage  ENT: no runny nose   Cardiovascular: chest pain  Resp: no SOB  GI: no vomiting GU: no dysuria Integumentary: no rash  Allergy: no hives  Musculoskeletal: no leg swelling  Neurological: no slurred speech ROS otherwise negative  PAST MEDICAL HISTORY/PAST SURGICAL HISTORY:  Past Medical History:  Diagnosis Date  . Bronchitis   . Hypertension     MEDICATIONS:  Prior to Admission medications   Medication Sig Start Date End Date Taking? Authorizing Provider  beclomethasone (QVAR) 40 MCG/ACT inhaler Inhale 2 puffs into the lungs daily as needed (for shortness of breath).   Yes [provider]  diphenhydrAMINE  (BENADRYL) 2 % cream Apply 1 application topically 3 (three) times daily as needed for itching.   Yes [provider]  hydrOXYzine (ATARAX/VISTARIL) 25 MG tablet Take 1 tablet (25 mg total) by mouth every 6 (six) hours as needed. Patient taking differently: Take 25 mg by mouth every 6 (six) hours as needed for anxiety.  04/03/17  Yes Bridget Hartshorn L, PA-C  ibuprofen (ADVIL,MOTRIN) 200 MG tablet Take 800 mg by mouth every 6 (six) hours as needed for fever, headache, mild pain, moderate pain or cramping.   Yes [provider]  lisinopril (PRINIVIL,ZESTRIL) 5 MG tablet Take 1 tablet (5 mg total) by mouth daily. Patient not taking: Reported on 04/16/2017 04/13/17 04/13/18  Jene Every, MD    ALLERGIES:  Allergies  Allergen Reactions  . Dayquil [Pseudoephedrine-Apap-Dm] Shortness Of Breath  . Nyquil Multi-Symptom [Pseudoeph-Doxylamine-Dm-Apap] Shortness Of Breath    SOCIAL HISTORY:  Social History  Substance Use Topics  . Smoking status: Current Every Day Smoker    Packs/day: 1.00    Types: Cigarettes  . Smokeless tobacco: Never Used  . Alcohol use No    FAMILY HISTORY: No family history on file.  EXAM: BP (!) 139/98   Pulse 95   Temp (!) 100.9 F (38.3 C) (Oral)   Resp 18   Ht 6' (1.829 m)   Wt 256 lb (116.1 kg)   SpO2 95%   BMI 34.72 kg/m  CONSTITUTIONAL: Alert and oriented and responds appropriately to questions. Well-appearing; well-nourished HEAD: Normocephalic EYES: Conjunctivae clear, pupils appear equal, EOMI ENT:  normal nose; moist mucous membranes NECK: Supple, no meningismus, no nuchal rigidity, no LAD  CARD: Regular and tachycardic; S1 and S2 appreciated; no murmurs, no clicks, no rubs, no gallops RESP: Normal chest excursion without splinting or tachypnea; breath sounds clear and equal bilaterally; no wheezes, no rhonchi, no rales, no hypoxia or respiratory distress, speaking full sentences ABD/GI: Normal bowel sounds; non-distended; soft,  diffusely tender throughout left abdomen and left flank, no rebound, no guarding, no peritoneal signs, no hepatosplenomegaly BACK:  The back appears normal and is non-tender to palpation EXT: Normal ROM in all joints; non-tender to palpation; no edema; normal capillary refill; no cyanosis, no calf tenderness or swelling    SKIN: Normal color for age and race; warm; multiple open lesions to all fingers, between fingers, and on right wrist that appear consistent with eczematous lesions with excoriation, there are some papules noted to both palms, no petechiae or purpura, no blisters or desquamation, no surrounding erythema or warmth or fluctuance or induration NEURO: Moves all extremities equally PSYCH: The patient's mood and manner are appropriate. Grooming and personal hygiene are appropriate.  MEDICAL DECISION MAKING: Patient here with fever, complaints of chest pain, cough, abdominal pain and multiple eczematous lesions to his hands with excoriations. None of the skin lesions appear to be infected at this time. He does have some papules on both palms. We'll send HIV and syphilis tests. Labs obtained in triage are unremarkable other than a leukocytosis. Lactate normal. Troponin negative. Chest x-ray shows bronchitic changes but no infiltrate. Given patient has significant tenderness throughout the left abdomen, will obtain CT of abdomen and pelvis and urinalysis to evaluate for possible colitis, diverticulitis, UTI, pyelonephritis. We'll give IV fluids, pain and nausea medication.  ED PROGRESS: Patient's CT of the abdomen and pelvis shows no acute abnormality. Urine shows no sign infection. Patient reports feeling much better and is ready to go home. He states he is ready to eat and drink. He has not had any vomiting here. Second troponin obtained and is also negative. We'll discharge with prescription for triamcinolone cream given he reports steroids did previously help with his rash. HIV, syphilis  testing is pending. Have given him outpatient primary care as well as dermatology follow-up information. He is comfortable with this plan. Recommended alternating Tylenol and Motrin and home for fever and pain. Discussed with family that I suspect that his fever, leukocytosis is likely from viral upper respiratory infection. I do not feel he needs antibiotics at this time.   At this time, I do not feel there is any life-threatening condition present. I have reviewed and discussed all results (EKG, imaging, lab, urine as appropriate) and exam findings with patient/family. I have reviewed nursing notes and appropriate previous records.  I feel the patient is safe to be discharged home without further emergent workup and can continue workup as an outpatient as needed. Discussed usual and customary return precautions. Patient/family verbalize understanding and are comfortable with this plan.  Outpatient follow-up has been provided if needed. All questions have been answered.    EKG Interpretation  Date/Time:  Tuesday April 16 2017 19:39:01 EDT Ventricular Rate:  108 PR Interval:  174 QRS Duration: 82 QT Interval:  350 QTC Calculation: 469 R Axis:   44 Text Interpretation:  Sinus tachycardia Nonspecific T wave abnormality Abnormal ECG When compared with ECG of 04/13/2017, No significant change was found Confirmed by Dione BoozeGlick, David ([id number]) on 04/16/2017 7:48:49 PM Also confirmed by Dione BoozeGlick, David ([id number]), editor Jac CanavanWatlington, Beverly ([id number])  on 04/17/2017 6:47:59 AM       I personally performed the services described in this documentation, which was scribed in my presence. The recorded information has been reviewed and is accurate.     Calob Baskette, Layla Maw, DO 04/17/17 [phone]

## 2018-11-03 ENCOUNTER — Emergency Department
Admission: EM | Admit: 2018-11-03 | Discharge: 2018-11-03 | Disposition: A | Discharge status: Home / Self Care | Payer: Self-pay | Attending: Student in an Organized Health Care Education/Training Program | Admitting: Student in an Organized Health Care Education/Training Program

## 2018-11-03 ENCOUNTER — Encounter: Payer: Self-pay | Admitting: Emergency Medicine

## 2018-11-03 ENCOUNTER — Other Ambulatory Visit: Payer: Self-pay

## 2018-11-03 ENCOUNTER — Emergency Department: Payer: Self-pay

## 2018-11-03 DIAGNOSIS — J40 Bronchitis, not specified as acute or chronic: Secondary | ICD-10-CM | POA: Insufficient documentation

## 2018-11-03 DIAGNOSIS — I10 Essential (primary) hypertension: Secondary | ICD-10-CM | POA: Insufficient documentation

## 2018-11-03 DIAGNOSIS — Z79899 Other long term (current) drug therapy: Secondary | ICD-10-CM | POA: Insufficient documentation

## 2018-11-03 DIAGNOSIS — F17210 Nicotine dependence, cigarettes, uncomplicated: Secondary | ICD-10-CM | POA: Insufficient documentation

## 2018-11-03 LAB — CBC
HCT: 48.7 % (ref 39.0–52.0)
HEMOGLOBIN: 14.8 g/dL (ref 13.0–17.0)
MCH: 27.3 pg (ref 26.0–34.0)
MCHC: 30.4 g/dL (ref 30.0–36.0)
MCV: 89.7 fL (ref 80.0–100.0)
Platelets: 288 10*3/uL (ref 150–400)
RBC: 5.43 MIL/uL (ref 4.22–5.81)
RDW: 13 % (ref 11.5–15.5)
WBC: 8.7 10*3/uL (ref 4.0–10.5)
nRBC: 0 % (ref 0.0–0.2)

## 2018-11-03 LAB — BASIC METABOLIC PANEL
Anion gap: 5 (ref 5–15)
BUN: 9 mg/dL (ref 6–20)
CALCIUM: 8.9 mg/dL (ref 8.9–10.3)
CO2: 30 mmol/L (ref 22–32)
CREATININE: 0.7 mg/dL (ref 0.61–1.24)
Chloride: 104 mmol/L (ref 98–111)
Glucose, Bld: 113 mg/dL — ABNORMAL HIGH (ref 70–99)
Potassium: 4.2 mmol/L (ref 3.5–5.1)
SODIUM: 139 mmol/L (ref 135–145)

## 2018-11-03 LAB — TROPONIN I

## 2018-11-03 LAB — INFLUENZA PANEL BY PCR (TYPE A & B)
Influenza A By PCR: NEGATIVE
Influenza B By PCR: NEGATIVE

## 2018-11-03 MED ORDER — AZITHROMYCIN 500 MG PO TABS
500.0000 mg | ORAL_TABLET | Freq: Once | ORAL | Status: AC
  Administered 2018-11-03: 500 mg via ORAL
  Filled 2018-11-03: qty 1

## 2018-11-03 MED ORDER — PREDNISONE 20 MG PO TABS: 40.0000 mg | ORAL_TABLET | Freq: Every day | ORAL | 0 refills | Status: AC

## 2018-11-03 MED ORDER — SODIUM CHLORIDE 0.9% FLUSH
3.0000 mL | Freq: Once | INTRAVENOUS | Status: AC
  Administered 2018-11-03: 3 mL via INTRAVENOUS

## 2018-11-03 MED ORDER — IPRATROPIUM-ALBUTEROL 0.5-2.5 (3) MG/3ML IN SOLN
3.0000 mL | Freq: Once | RESPIRATORY_TRACT | Status: AC
  Administered 2018-11-03: 3 mL via RESPIRATORY_TRACT
  Filled 2018-11-03: qty 3

## 2018-11-03 MED ORDER — BECLOMETHASONE DIPROPIONATE 40 MCG/ACT IN AERS: 2.0000 | INHALATION_SPRAY | Freq: Every day | RESPIRATORY_TRACT | 1 refills | Status: DC | PRN

## 2018-11-03 MED ORDER — ALBUTEROL SULFATE HFA 108 (90 BASE) MCG/ACT IN AERS: 2.0000 | INHALATION_SPRAY | Freq: Four times a day (QID) | RESPIRATORY_TRACT | 2 refills | Status: DC | PRN

## 2018-11-03 MED ORDER — METHYLPREDNISOLONE SODIUM SUCC 125 MG IJ SOLR
125.0000 mg | Freq: Once | INTRAMUSCULAR | Status: AC
  Administered 2018-11-03: 125 mg via INTRAVENOUS
  Filled 2018-11-03: qty 2

## 2018-11-03 MED ORDER — AZITHROMYCIN 500 MG PO TABS: 500.0000 mg | ORAL_TABLET | Freq: Every day | ORAL | 0 refills | Status: AC

## 2018-11-03 MED ORDER — HYDROCOD POLST-CPM POLST ER 10-8 MG/5ML PO SUER
5.0000 mL | Freq: Once | ORAL | Status: AC
  Administered 2018-11-03: 5 mL via ORAL
  Filled 2018-11-03: qty 5

## 2018-11-03 NOTE — ED Triage Notes (Signed)
L chest pain began during night. States pain increases wilth cough but is constant. States cough began yesterday.

## 2018-11-03 NOTE — ED Notes (Signed)
Pt with observed sleep apnea with O2 sats dropping to 60'S. Pt placed on 2 LNC. EDP notified. Pt sats 97% while awake on RA

## 2018-11-03 NOTE — ED Notes (Signed)
Pt ambulated in hall on RA. sats 98%, HR 88. Pt ambulated without issue.

## 2018-11-03 NOTE — ED Notes (Signed)
Patient transported to X-ray 

## 2018-11-03 NOTE — ED Provider Notes (Signed)
[facility] Emergency Department Provider Note    None    (approximate)  I have reviewed the triage vital signs and the nursing notes.   HISTORY  Chief Complaint Chest Pain    HPI Ryan Maxwell is a 33 y.o. male below listed past medical history he does smoke daily presents the ER with 24 hours of cough and chest pain.  States he is coughing up phlegm.  No measured fevers or chills.  States the pain is worse with coughing.  Denies any lower extremity swelling.  His daughter is sick with viral illness.  Is not been on any recent antibiotics.  Has been diagnosed with bronchitis previously.  The pain is mild to moderate.    Past Medical History:  Diagnosis Date  . Bronchitis   . Hypertension    No family history on file. History reviewed. No pertinent surgical history. There are no active problems to display for this patient.     Prior to Admission medications   Medication Sig Start Date End Date Taking? Authorizing Provider  ibuprofen (ADVIL,MOTRIN) 200 MG tablet Take 800 mg by mouth every 6 (six) hours as needed for fever, headache, mild pain, moderate pain or cramping.   Yes [provider]  albuterol (PROVENTIL HFA;VENTOLIN HFA) 108 (90 Base) MCG/ACT inhaler Inhale 2 puffs into the lungs every 6 (six) hours as needed for wheezing or shortness of breath. 11/03/18   Willy Eddy, MD  azithromycin (ZITHROMAX) 500 MG tablet Take 1 tablet (500 mg total) by mouth daily for 2 days. Take 1 tablet daily for 3 days. 11/03/18 11/05/18  Willy Eddy, MD  beclomethasone (QVAR) 40 MCG/ACT inhaler Inhale 2 puffs into the lungs daily as needed (for shortness of breath). 11/03/18   Willy Eddy, MD  lisinopril (PRINIVIL,ZESTRIL) 5 MG tablet Take 1 tablet (5 mg total) by mouth daily. Patient not taking: Reported on 04/16/2017 04/13/17 04/13/18  Jene Every, MD  predniSONE (DELTASONE) 20 MG tablet Take 2 tablets (40 mg total) by mouth daily for 5  days. 11/03/18 11/08/18  Willy Eddy, MD    Allergies Dayquil [pseudoephedrine-apap-dm] and Nyquil multi-symptom [pseudoeph-doxylamine-dm-apap]    Social History Social History   Tobacco Use  . Smoking status: Current Every Day Smoker    Packs/day: 1.00    Types: Cigarettes  . Smokeless tobacco: Never Used  Substance Use Topics  . Alcohol use: No  . Drug use: Yes    Types: Marijuana    Comment: less than 1 x per week    Review of Systems Patient denies headaches, rhinorrhea, blurry vision, numbness, shortness of breath, chest pain, edema, cough, abdominal pain, nausea, vomiting, diarrhea, dysuria, fevers, rashes or hallucinations unless otherwise stated above in HPI. ____________________________________________   PHYSICAL EXAM:  VITAL SIGNS: Vitals:   11/03/18 0915 11/03/18 0930  BP:  (!) 146/104  Pulse: 77 84  Resp: 15 14  Temp:    SpO2: 100% 97%    Constitutional: Alert and oriented.  Eyes: Conjunctivae are normal.  Head: Atraumatic. Nose: No congestion/rhinnorhea. Mouth/Throat: Mucous membranes are moist.   Neck: No stridor. Painless ROM.  Cardiovascular: Normal rate, regular rhythm. Grossly normal heart sounds.  Good peripheral circulation. Respiratory: Normal respiratory effort.  No retractions. Lungs with coarse rhonchorus breathsounds. Gastrointestinal: Soft and nontender. No distention. No abdominal bruits. No CVA tenderness. Genitourinary:  Musculoskeletal: No lower extremity tenderness nor edema.  No joint effusions. Neurologic:  Normal speech and language. No gross focal neurologic deficits are appreciated. No  facial droop Skin:  Skin is warm, dry and intact. No rash noted. Psychiatric: Mood and affect are normal. Speech and behavior are normal.  ____________________________________________   LABS (all labs ordered are listed, but only abnormal results are displayed)  Results for orders placed or performed during the hospital encounter of  11/03/18 (from the past 24 hour(s))  Basic metabolic panel     Status: Abnormal   Collection Time: 11/03/18  7:33 AM  Result Value Ref Range   Sodium 139 135 - 145 mmol/L   Potassium 4.2 3.5 - 5.1 mmol/L   Chloride 104 98 - 111 mmol/L   CO2 30 22 - 32 mmol/L   Glucose, Bld 113 (H) 70 - 99 mg/dL   BUN 9 6 - 20 mg/dL   Creatinine, Ser 8.64 0.61 - 1.24 mg/dL   Calcium 8.9 8.9 - 84.7 mg/dL   GFR calc non Af Amer >60 >60 mL/min   GFR calc Af Amer >60 >60 mL/min   Anion gap 5 5 - 15  CBC     Status: None   Collection Time: 11/03/18  7:33 AM  Result Value Ref Range   WBC 8.7 4.0 - 10.5 K/uL   RBC 5.43 4.22 - 5.81 MIL/uL   Hemoglobin 14.8 13.0 - 17.0 g/dL   HCT 20.7 21.8 - 28.8 %   MCV 89.7 80.0 - 100.0 fL   MCH 27.3 26.0 - 34.0 pg   MCHC 30.4 30.0 - 36.0 g/dL   RDW 33.7 44.5 - 14.6 %   Platelets 288 150 - 400 K/uL   nRBC 0.0 0.0 - 0.2 %  Troponin I - ONCE - STAT     Status: None   Collection Time: 11/03/18  7:33 AM  Result Value Ref Range   Troponin I <0.03 <0.03 ng/mL  Influenza panel by PCR (type A & B)     Status: None   Collection Time: 11/03/18  7:33 AM  Result Value Ref Range   Influenza A By PCR NEGATIVE NEGATIVE   Influenza B By PCR NEGATIVE NEGATIVE   ____________________________________________  EKG My review and personal interpretation at Time: 6:59   Indication: chest pain  Rate: 85  Rhythm: sinus Axis: normal Other: normal intervals, no stemi ____________________________________________  RADIOLOGY  I personally reviewed all radiographic images ordered to evaluate for the above acute complaints and reviewed radiology reports and findings.  These findings were personally discussed with the patient.  Please see medical record for radiology report.  ____________________________________________   PROCEDURES  Procedure(s) performed:  Procedures    Critical Care performed: no ____________________________________________   INITIAL IMPRESSION / ASSESSMENT  AND PLAN / ED COURSE  Pertinent labs & imaging results that were available during my care of the patient were reviewed by me and considered in my medical decision making (see chart for details).   DDX: Asthma, copd, CHF, pna, ptx, malignancy, Pe, anemia   Ryan Maxwell is a 33 y.o. who presents to the ED with shortness of breath and chest pain as described above.  Seems quite bronchitic in nature.  EKG shows no evidence of acute ischemia.  Initial troponin is negative.  Will give nebulizers steroids and antitussive and reassess.  Patient without any O2 requirement at this time.  Clinical Course as of Nov 04 1043  Mon Nov 03, 2018  0479 Blood work is reassuring.  States that pretty significant component of sleep apnea as anytime he dozes off he becomes hypoxic but then when awoken will return  to normal.  Likely exacerbated by toxin asked that he was given here for cough suppression.  He is feeling improved.  Will give antibiotics to treat for atypical pneumonia given his productive cough rhonchorous breath sounds with bronchitis.  Will observe in the ER.   [PR]  1028 Patient able to ambulate with steady gait with no hypoxia.  This point will continue treatment for bronchitis with antibiotics.  Symptoms seem to be significantly improved after nebulizer treatment.  Discussed signs and symptoms for which the patient should return to the ER.   [PR]    Clinical Course User Index [PR] Willy Eddyobinson, Kayo Zion, MD     As part of my medical decision making, I reviewed the following data within the electronic MEDICAL RECORD NUMBER Nursing notes reviewed and incorporated, Labs reviewed, notes from prior ED visits and [place] Controlled Substance Database   ____________________________________________   FINAL CLINICAL IMPRESSION(S) / ED DIAGNOSES  Final diagnoses:  Bronchitis      NEW MEDICATIONS STARTED DURING THIS VISIT:  New Prescriptions   ALBUTEROL (PROVENTIL HFA;VENTOLIN HFA) 108 (90 BASE) MCG/ACT  INHALER    Inhale 2 puffs into the lungs every 6 (six) hours as needed for wheezing or shortness of breath.   AZITHROMYCIN (ZITHROMAX) 500 MG TABLET    Take 1 tablet (500 mg total) by mouth daily for 2 days. Take 1 tablet daily for 3 days.   PREDNISONE (DELTASONE) 20 MG TABLET    Take 2 tablets (40 mg total) by mouth daily for 5 days.     Note:  This document was prepared using Dragon voice recognition software and may include unintentional dictation errors.    Willy Eddyobinson, Antha Niday, MD 11/03/18 1046

## 2019-07-24 ENCOUNTER — Encounter (HOSPITAL_COMMUNITY): Payer: Self-pay

## 2019-07-24 ENCOUNTER — Emergency Department (HOSPITAL_COMMUNITY)
Admission: EM | Admit: 2019-07-24 | Discharge: 2019-07-24 | Disposition: A | Discharge status: Home / Self Care | Payer: Self-pay | Attending: Emergency Medicine | Admitting: Emergency Medicine

## 2019-07-24 ENCOUNTER — Other Ambulatory Visit: Payer: Self-pay

## 2019-07-24 DIAGNOSIS — K0889 Other specified disorders of teeth and supporting structures: Secondary | ICD-10-CM | POA: Insufficient documentation

## 2019-07-24 DIAGNOSIS — I10 Essential (primary) hypertension: Secondary | ICD-10-CM | POA: Insufficient documentation

## 2019-07-24 DIAGNOSIS — F17210 Nicotine dependence, cigarettes, uncomplicated: Secondary | ICD-10-CM | POA: Insufficient documentation

## 2019-07-24 DIAGNOSIS — Z79899 Other long term (current) drug therapy: Secondary | ICD-10-CM | POA: Insufficient documentation

## 2019-07-24 MED ORDER — NAPROXEN 500 MG PO TABS: 500.0000 mg | ORAL_TABLET | Freq: Two times a day (BID) | ORAL | 0 refills | Status: DC

## 2019-07-24 MED ORDER — AMOXICILLIN-POT CLAVULANATE 875-125 MG PO TABS: 1.0000 | ORAL_TABLET | Freq: Two times a day (BID) | ORAL | 0 refills | Status: DC

## 2019-07-24 NOTE — Discharge Instructions (Addendum)
Call your dentist or one of the dentists offices provided to schedule an appointment for re-evaluation and further management within the next 48 hours.   I have prescribed you Augmentin which is an antibiotic to treat the infection and Naproxen which is an anti-inflammatory medicine to treat the pain.   Please take all of your antibiotics until finished. You may develop abdominal discomfort or diarrhea from the antibiotic.  You may help offset this with probiotics which you can buy at the store (ask your pharmacist if unable to find) or get probiotics in the form of eating yogurt. Do not eat or take the probiotics until 2 hours after your antibiotic. If you are unable to tolerate these side effects follow-up with your primary care provider or return to the emergency department.   If you begin to experience any blistering, rashes, swelling, or difficulty breathing seek medical care for evaluation of potentially more serious side effects.   Be sure to eat something when taking the Naproxen as it can cause stomach upset and at worst stomach bleeding. Do not take additional non steroidal anti-inflammatory medicines such as Ibuprofen, Aleve, Advil, Mobic, Diclofenac, or goodie powder while taking Naproxen. You may supplement with Tylenol.   We have prescribed you new medication(s) today. Discuss the medications prescribed today with your pharmacist as they can have adverse effects and interactions with your other medicines including over the counter and prescribed medications. Seek medical evaluation if you start to experience new or abnormal symptoms after taking one of these medicines, seek care immediately if you start to experience difficulty breathing, feeling of your throat closing, facial swelling, or rash as these could be indications of a more serious allergic reaction  If you start to experience and new or worsening symptoms return to the emergency department. If you start to experience fever,  chills, neck stiffness/pain, or inability to move your neck or open your mouth come back to the emergency department immediately.   Your blood pressure was noted to be elevated, please have this rechecked by primary care in 1 to 2 weeks.

## 2019-07-24 NOTE — ED Triage Notes (Signed)
Patient states he used toothpaste with charcoal in it and after using he had bleeding and swelling of his gums.  Patient states he has used salt water, listerine, and orajel.

## 2019-07-24 NOTE — ED Provider Notes (Signed)
[facility] DEPT Provider Note   CSN: [id number] Arrival date & time: 07/24/19  0901     History   Chief Complaint Chief Complaint  Patient presents with  . dental issues    HPI Ryan Maxwell is a 33 y.o. male with a hx of tobacco abuse, HTN, & bronchitis who presents to the emergency department with complaints of left upper dental pain for the past 1 week.  Patient states that he has had progressively worsening constant discomfort to the left upper posterior molar region.  He has had some mild associated facial swelling.  He called his dentist and was recommended to use Listerine rinse and salt water which he had tried with minimal difference.  He states this morning he tried brushing his teeth with charcoal toothpaste feels that he irritated the area causing some mild bleeding.  No other alleviating or aggravating factors.  Denies fever, sore throat, dysphagia, change in voice, drooling, or neck stiffness.    HPI  Past Medical History:  Diagnosis Date  . Bronchitis   . Hypertension     There are no active problems to display for this patient.   History reviewed. No pertinent surgical history.      Home Medications    Prior to Admission medications   Medication Sig Start Date End Date Taking? Authorizing Provider  albuterol (PROVENTIL HFA;VENTOLIN HFA) 108 (90 Base) MCG/ACT inhaler Inhale 2 puffs into the lungs every 6 (six) hours as needed for wheezing or shortness of breath. 11/03/18   Merlyn Lot, MD  beclomethasone (QVAR) 40 MCG/ACT inhaler Inhale 2 puffs into the lungs daily as needed (for shortness of breath). 11/03/18   Merlyn Lot, MD  ibuprofen (ADVIL,MOTRIN) 200 MG tablet Take 800 mg by mouth every 6 (six) hours as needed for fever, headache, mild pain, moderate pain or cramping.    [provider]  lisinopril (PRINIVIL,ZESTRIL) 5 MG tablet Take 1 tablet (5 mg total) by mouth daily. Patient not taking:  Reported on 04/16/2017 04/13/17 04/13/18  Lavonia Drafts, MD    Family History History reviewed. No pertinent family history.  Social History Social History   Tobacco Use  . Smoking status: Current Every Day Smoker    Packs/day: 1.00    Types: Cigarettes  . Smokeless tobacco: Never Used  Substance Use Topics  . Alcohol use: No  . Drug use: Yes    Types: Marijuana     Allergies   Dayquil [pseudoephedrine-apap-dm] and Nyquil multi-symptom [pseudoeph-doxylamine-dm-apap]   Review of Systems Review of Systems  Constitutional: Negative for fever.  HENT: Positive for dental problem. Negative for congestion, ear pain, sore throat, trouble swallowing and voice change.   Respiratory: Negative for cough and shortness of breath.   Cardiovascular: Negative for chest pain.  Gastrointestinal: Negative for abdominal pain.     Physical Exam Updated Vital Signs BP (!) 179/126 (BP Location: Left Arm)   Pulse 96   Temp 98.4 F (36.9 C) (Oral)   Resp 18   Ht 6' (1.829 m)   Wt 129.3 kg   SpO2 93%   BMI 38.65 kg/m   Physical Exam Vitals signs and nursing note reviewed.  Constitutional:      General: He is not in acute distress.    Appearance: He is well-developed. He is not toxic-appearing.  HENT:     Head: Normocephalic and atraumatic.     Right Ear: Tympanic membrane is not perforated, erythematous, retracted or bulging.     Left Ear:  Tympanic membrane is not perforated, erythematous, retracted or bulging.     Nose: Nose normal.     Mouth/Throat:     Pharynx: Uvula midline. No oropharyngeal exudate or posterior oropharyngeal erythema.     Comments: Patient has poor dentition throughout.  Left upper gingiva in the area of the molars more so posteriorly is mildly swollen and tender to palpation.  Small amount of bleeding with palpation that is stopped with pressure.  No palpable fluctuance or gross abscess. Posterior oropharynx is symmetric appearing. Patient tolerating own  secretions without difficulty. No trismus. No drooling. No hot potato voice. No swelling beneath the tongue, submandibular compartment is soft.  Eyes:     General:        Right eye: No discharge.        Left eye: No discharge.     Conjunctiva/sclera: Conjunctivae normal.  Neck:     Musculoskeletal: Normal range of motion and neck supple. No edema, erythema, neck rigidity or crepitus.  Cardiovascular:     Rate and Rhythm: Normal rate and regular rhythm.  Pulmonary:     Effort: Pulmonary effort is normal.     Breath sounds: Normal breath sounds.  Lymphadenopathy:     Cervical: No cervical adenopathy.  Neurological:     Mental Status: He is alert.  Psychiatric:        Behavior: Behavior normal.        Thought Content: Thought content normal.      ED Treatments / Results  Labs (all labs ordered are listed, but only abnormal results are displayed) Labs Reviewed - No data to display  EKG None  Radiology No results found.  Procedures Procedures (including critical care time)  Medications Ordered in ED Medications - No data to display   Initial Impression / Assessment and Plan / ED Course  I have reviewed the triage vital signs and the nursing notes.  Pertinent labs & imaging results that were available during my care of the patient were reviewed by me and considered in my medical decision making (see chart for details).    Patient presents with dental pain. Patient is nontoxic appearing, vitals without significant abnormality other than elevated BP- doubt HTN emergency- PCP recheck. No gross abscess.  Exam unconcerning for Ludwig's angina or spread of infection.  Bleeding controlled. Will treat with Augmentin and Naproxen.  Urged patient to follow-up with dentist, dental resources were provided.  Discussed treatment plan and need for follow up as well as return precautions. Provided opportunity for questions, patient confirmed understanding and is agreeable to plan.      Final Clinical Impressions(s) / ED Diagnoses   Final diagnoses:  Pain, dental    ED Discharge Orders         Ordered    amoxicillin-clavulanate (AUGMENTIN) 875-125 MG tablet  Every 12 hours     07/24/19 1117    naproxen (NAPROSYN) 500 MG tablet  2 times daily     07/24/19 [address] [place] R, PA-C 07/24/19 1130    Terald Sleeper, MD 07/24/19 1825

## 2019-11-13 ENCOUNTER — Other Ambulatory Visit: Payer: Self-pay

## 2020-03-06 ENCOUNTER — Inpatient Hospital Stay (HOSPITAL_COMMUNITY)
Admission: EM | Admit: 2020-03-06 | Discharge: 2020-03-11 | DRG: 177 | Disposition: A | Discharge status: Home Health | Payer: HRSA Program | Attending: Family Medicine | Admitting: Family Medicine

## 2020-03-06 ENCOUNTER — Other Ambulatory Visit: Payer: Self-pay

## 2020-03-06 ENCOUNTER — Encounter (HOSPITAL_COMMUNITY): Payer: Self-pay | Admitting: Emergency Medicine

## 2020-03-06 ENCOUNTER — Emergency Department (HOSPITAL_COMMUNITY): Payer: HRSA Program

## 2020-03-06 DIAGNOSIS — Z9119 Patient's noncompliance with other medical treatment and regimen: Secondary | ICD-10-CM | POA: Diagnosis not present

## 2020-03-06 DIAGNOSIS — R05 Cough: Secondary | ICD-10-CM

## 2020-03-06 DIAGNOSIS — R0902 Hypoxemia: Secondary | ICD-10-CM

## 2020-03-06 DIAGNOSIS — R509 Fever, unspecified: Secondary | ICD-10-CM | POA: Diagnosis present

## 2020-03-06 DIAGNOSIS — F17210 Nicotine dependence, cigarettes, uncomplicated: Secondary | ICD-10-CM | POA: Diagnosis present

## 2020-03-06 DIAGNOSIS — J9601 Acute respiratory failure with hypoxia: Secondary | ICD-10-CM

## 2020-03-06 DIAGNOSIS — J440 Chronic obstructive pulmonary disease with acute lower respiratory infection: Secondary | ICD-10-CM | POA: Diagnosis present

## 2020-03-06 DIAGNOSIS — G4733 Obstructive sleep apnea (adult) (pediatric): Secondary | ICD-10-CM | POA: Diagnosis present

## 2020-03-06 DIAGNOSIS — U071 COVID-19: Principal | ICD-10-CM | POA: Diagnosis present

## 2020-03-06 DIAGNOSIS — E669 Obesity, unspecified: Secondary | ICD-10-CM | POA: Diagnosis present

## 2020-03-06 DIAGNOSIS — J9602 Acute respiratory failure with hypercapnia: Secondary | ICD-10-CM | POA: Diagnosis present

## 2020-03-06 DIAGNOSIS — Z6838 Body mass index (BMI) 38.0-38.9, adult: Secondary | ICD-10-CM | POA: Diagnosis not present

## 2020-03-06 DIAGNOSIS — Z7951 Long term (current) use of inhaled steroids: Secondary | ICD-10-CM | POA: Diagnosis not present

## 2020-03-06 DIAGNOSIS — I10 Essential (primary) hypertension: Secondary | ICD-10-CM | POA: Diagnosis not present

## 2020-03-06 DIAGNOSIS — Z888 Allergy status to other drugs, medicaments and biological substances status: Secondary | ICD-10-CM | POA: Diagnosis not present

## 2020-03-06 DIAGNOSIS — J1282 Pneumonia due to coronavirus disease 2019: Secondary | ICD-10-CM | POA: Diagnosis present

## 2020-03-06 DIAGNOSIS — R059 Cough, unspecified: Secondary | ICD-10-CM

## 2020-03-06 HISTORY — DX: Acute respiratory failure with hypoxia: J96.01

## 2020-03-06 HISTORY — DX: COVID-19: U07.1

## 2020-03-06 HISTORY — DX: Acute respiratory failure with hypercapnia: J96.02

## 2020-03-06 LAB — CBC WITH DIFFERENTIAL/PLATELET
Abs Immature Granulocytes: 0.05 10*3/uL (ref 0.00–0.07)
Basophils Absolute: 0 10*3/uL (ref 0.0–0.1)
Basophils Relative: 0 %
Eosinophils Absolute: 0 10*3/uL (ref 0.0–0.5)
Eosinophils Relative: 0 %
HCT: 47.5 % (ref 39.0–52.0)
Hemoglobin: 13.8 g/dL (ref 13.0–17.0)
Immature Granulocytes: 1 %
Lymphocytes Relative: 27 %
Lymphs Abs: 1.7 10*3/uL (ref 0.7–4.0)
MCH: 27.3 pg (ref 26.0–34.0)
MCHC: 29.1 g/dL — ABNORMAL LOW (ref 30.0–36.0)
MCV: 94.1 fL (ref 80.0–100.0)
Monocytes Absolute: 1.2 10*3/uL — ABNORMAL HIGH (ref 0.1–1.0)
Monocytes Relative: 19 %
Neutro Abs: 3.4 10*3/uL (ref 1.7–7.7)
Neutrophils Relative %: 53 %
Platelets: 186 10*3/uL (ref 150–400)
RBC: 5.05 MIL/uL (ref 4.22–5.81)
RDW: 13.1 % (ref 11.5–15.5)
WBC: 6.4 10*3/uL (ref 4.0–10.5)
nRBC: 0 % (ref 0.0–0.2)

## 2020-03-06 LAB — URINALYSIS, ROUTINE W REFLEX MICROSCOPIC
Bacteria, UA: NONE SEEN
Bilirubin Urine: NEGATIVE
Glucose, UA: NEGATIVE mg/dL
Hgb urine dipstick: NEGATIVE
Ketones, ur: NEGATIVE mg/dL
Leukocytes,Ua: NEGATIVE
Nitrite: NEGATIVE
Protein, ur: 30 mg/dL — AB
Specific Gravity, Urine: 1.025 (ref 1.005–1.030)
pH: 5 (ref 5.0–8.0)

## 2020-03-06 LAB — COMPREHENSIVE METABOLIC PANEL
ALT: 49 U/L — ABNORMAL HIGH (ref 0–44)
AST: 41 U/L (ref 15–41)
Albumin: 3.7 g/dL (ref 3.5–5.0)
Alkaline Phosphatase: 50 U/L (ref 38–126)
Anion gap: 10 (ref 5–15)
BUN: 10 mg/dL (ref 6–20)
CO2: 27 mmol/L (ref 22–32)
Calcium: 8 mg/dL — ABNORMAL LOW (ref 8.9–10.3)
Chloride: 104 mmol/L (ref 98–111)
Creatinine, Ser: 0.91 mg/dL (ref 0.61–1.24)
GFR calc Af Amer: >60 mL/min (ref >60)
GFR calc non Af Amer: >60 mL/min (ref >60)
Glucose, Bld: 118 mg/dL — ABNORMAL HIGH (ref 70–99)
Potassium: 3.6 mmol/L (ref 3.5–5.1)
Sodium: 141 mmol/L (ref 135–145)
Total Bilirubin: 0.5 mg/dL (ref 0.3–1.2)
Total Protein: 7.5 g/dL (ref 6.5–8.1)

## 2020-03-06 LAB — SARS CORONAVIRUS 2 BY RT PCR (HOSPITAL ORDER, PERFORMED IN ~~LOC~~ HOSPITAL LAB): SARS Coronavirus 2: POSITIVE — AB

## 2020-03-06 LAB — LACTIC ACID, PLASMA: Lactic Acid, Venous: 1.7 mmol/L (ref 0.5–1.9)

## 2020-03-06 MED ORDER — ASCORBIC ACID 500 MG PO TABS
500.0000 mg | ORAL_TABLET | Freq: Every day | ORAL | Status: DC
  Administered 2020-03-07 – 2020-03-11 (×5): 500 mg via ORAL
  Filled 2020-03-06 (×5): qty 1

## 2020-03-06 MED ORDER — SODIUM CHLORIDE 0.9% FLUSH: 3.0000 mL | Freq: Once | INTRAVENOUS | Status: DC

## 2020-03-06 MED ORDER — SODIUM CHLORIDE 0.9 % IV SOLN: 100.0000 mg | Freq: Once | INTRAVENOUS | Status: DC

## 2020-03-06 MED ORDER — ZINC SULFATE 220 (50 ZN) MG PO CAPS
220.0000 mg | ORAL_CAPSULE | Freq: Every day | ORAL | Status: DC
  Administered 2020-03-07 – 2020-03-11 (×5): 220 mg via ORAL
  Filled 2020-03-06 (×5): qty 1

## 2020-03-06 MED ORDER — SODIUM CHLORIDE 0.9 % IV SOLN: 200.0000 mg | Freq: Once | INTRAVENOUS | Status: DC

## 2020-03-06 MED ORDER — ALBUTEROL SULFATE HFA 108 (90 BASE) MCG/ACT IN AERS
6.0000 | INHALATION_SPRAY | RESPIRATORY_TRACT | Status: AC
  Administered 2020-03-06 (×2): 6 via RESPIRATORY_TRACT
  Filled 2020-03-06: qty 6.7

## 2020-03-06 MED ORDER — DEXAMETHASONE SODIUM PHOSPHATE 10 MG/ML IJ SOLN
6.0000 mg | INTRAMUSCULAR | Status: DC
  Administered 2020-03-07 – 2020-03-08 (×3): 6 mg via INTRAVENOUS
  Filled 2020-03-06 (×3): qty 1

## 2020-03-06 MED ORDER — ALBUTEROL SULFATE HFA 108 (90 BASE) MCG/ACT IN AERS: 2.0000 | INHALATION_SPRAY | Freq: Four times a day (QID) | RESPIRATORY_TRACT | Status: DC | PRN

## 2020-03-06 MED ORDER — SODIUM CHLORIDE 0.9 % IV SOLN: 100.0000 mg | INTRAVENOUS | Status: DC

## 2020-03-06 MED ORDER — BENZONATATE 100 MG PO CAPS
200.0000 mg | ORAL_CAPSULE | Freq: Once | ORAL | Status: AC
  Administered 2020-03-06: 200 mg via ORAL
  Filled 2020-03-06: qty 2

## 2020-03-06 MED ORDER — SODIUM CHLORIDE 0.9 % IV SOLN: 100.0000 mg | Freq: Every day | INTRAVENOUS | Status: DC

## 2020-03-06 MED ORDER — IBUPROFEN 200 MG PO TABS
600.0000 mg | ORAL_TABLET | Freq: Once | ORAL | Status: AC
  Administered 2020-03-06: 600 mg via ORAL
  Filled 2020-03-06: qty 3

## 2020-03-06 MED ORDER — REMDESIVIR 100 MG IV SOLR: 100.0000 mg | Freq: Every day | INTRAVENOUS | Status: DC

## 2020-03-06 NOTE — ED Provider Notes (Addendum)
[facility] DEPT Provider Note   CSN: [id number] Arrival date & time: 03/06/20  1609     History Chief Complaint  Patient presents with  . Fever  . Nausea  . Emesis  . Generalized Body Aches    Ryan Maxwell is a 34 y.o. male.  HPI     34 year old male comes in a chief complaint of fever, nausea, vomiting and body aches. Patient has history of bronchitis and hypertension.  He reports that he started getting sick 3 days ago.  He thinks that the symptoms started after he quit smoking.  Ever since then he has been feeling unwell.  Patient has nausea without diarrhea or abdominal pain.  He is having generalized body aches, weakness, cough with green phlegm and shortness of breath.  He has not received his COVID-19 vaccine and denies any COVID-19 exposures.  Past Medical History:  Diagnosis Date  . Bronchitis   . Hypertension     There are no problems to display for this patient.   History reviewed. No pertinent surgical history.     No family history on file.  Social History   Tobacco Use  . Smoking status: Current Every Day Smoker    Packs/day: 1.00    Types: Cigarettes  . Smokeless tobacco: Never Used  Substance Use Topics  . Alcohol use: No  . Drug use: Yes    Types: Marijuana    Home Medications Prior to Admission medications   Medication Sig Start Date End Date Taking? Authorizing Provider  beclomethasone (QVAR) 40 MCG/ACT inhaler Inhale 2 puffs into the lungs daily as needed (for shortness of breath). 11/03/18  Yes Merlyn Lot, MD  ibuprofen (ADVIL,MOTRIN) 200 MG tablet Take 600 mg by mouth every 6 (six) hours as needed for fever, headache, mild pain, moderate pain or cramping.    Yes [provider]  albuterol (PROVENTIL HFA;VENTOLIN HFA) 108 (90 Base) MCG/ACT inhaler Inhale 2 puffs into the lungs every 6 (six) hours as needed for wheezing or shortness of breath. Patient not taking: Reported on 03/06/2020  11/03/18   Merlyn Lot, MD  amoxicillin-clavulanate (AUGMENTIN) 875-125 MG tablet Take 1 tablet by mouth every 12 (twelve) hours. Patient not taking: Reported on 03/06/2020 07/24/19   Petrucelli, Aldona Bar R, PA-C  lisinopril (PRINIVIL,ZESTRIL) 5 MG tablet Take 1 tablet (5 mg total) by mouth daily. Patient not taking: Reported on 04/16/2017 04/13/17 04/13/18  Lavonia Drafts, MD  naproxen (NAPROSYN) 500 MG tablet Take 1 tablet (500 mg total) by mouth 2 (two) times daily. Patient not taking: Reported on 03/06/2020 07/24/19   Petrucelli, Aldona Bar R, PA-C    Allergies    Dayquil [pseudoephedrine-apap-dm] and Nyquil multi-symptom [pseudoeph-doxylamine-dm-apap]  Review of Systems   Review of Systems  Constitutional: Positive for activity change.  Respiratory: Positive for cough and shortness of breath.   Gastrointestinal: Positive for nausea.  Allergic/Immunologic: Negative for immunocompromised state.  Hematological: Does not bruise/bleed easily.  All other systems reviewed and are negative.   Physical Exam Updated Vital Signs BP 135/81   Pulse 88   Temp (!) 101.9 F (38.8 C) (Oral)   Resp 20   SpO2 96%   Physical Exam Vitals and nursing note reviewed.  Constitutional:      Appearance: He is well-developed.  HENT:     Head: Normocephalic and atraumatic.  Eyes:     Conjunctiva/sclera: Conjunctivae normal.     Pupils: Pupils are equal, round, and reactive to light.  Cardiovascular:  Rate and Rhythm: Normal rate and regular rhythm.  Pulmonary:     Effort: Pulmonary effort is normal.     Breath sounds: Normal breath sounds.  Abdominal:     General: Bowel sounds are normal. There is no distension.     Palpations: Abdomen is soft. There is no mass.     Tenderness: There is no abdominal tenderness. There is no guarding or rebound.  Musculoskeletal:        General: No deformity.     Cervical back: Normal range of motion and neck supple.  Skin:    General: Skin is warm.    Neurological:     Mental Status: He is alert and oriented to person, place, and time.     ED Results / Procedures / Treatments   Labs (all labs ordered are listed, but only abnormal results are displayed) Labs Reviewed  SARS CORONAVIRUS 2 BY RT PCR (HOSPITAL ORDER, PERFORMED IN [facility]) - Abnormal; Notable for the following components:      Result Value   SARS Coronavirus 2 POSITIVE (*)    All other components within normal limits  COMPREHENSIVE METABOLIC PANEL - Abnormal; Notable for the following components:   Glucose, Bld 118 (*)    Calcium 8.0 (*)    ALT 49 (*)    All other components within normal limits  CBC WITH DIFFERENTIAL/PLATELET - Abnormal; Notable for the following components:   MCHC 29.1 (*)    Monocytes Absolute 1.2 (*)    All other components within normal limits  URINALYSIS, ROUTINE W REFLEX MICROSCOPIC - Abnormal; Notable for the following components:   APPearance TURBID (*)    Protein, ur 30 (*)    All other components within normal limits  SARS CORONAVIRUS 2 (TAT 6-24 HRS)  LACTIC ACID, PLASMA  LACTIC ACID, PLASMA    EKG None  Radiology DG Chest Port 1 View  Result Date: 03/06/2020 CLINICAL DATA:  Fever and chills.  Body aches. EXAM: PORTABLE CHEST 1 VIEW COMPARISON:  November 03, 2018 FINDINGS: The heart size and mediastinal contours are within normal limits. Both lungs are clear. The visualized skeletal structures are unremarkable. IMPRESSION: No active disease. Electronically Signed   By: Gerome Sam III M.D   On: 03/06/2020 17:46    Procedures .Critical Care Performed by: Derwood Kaplan, MD Authorized by: Derwood Kaplan, MD   Critical care provider statement:    Critical care time (minutes):  52   Critical care was necessary to treat or prevent imminent or life-threatening deterioration of the following conditions:  Respiratory failure   Critical care was time spent personally by me on the following activities:   Discussions with consultants, evaluation of patient's response to treatment, examination of patient, ordering and performing treatments and interventions, ordering and review of laboratory studies, ordering and review of radiographic studies, pulse oximetry, re-evaluation of patient's condition, obtaining history from patient or surrogate and review of old charts   (including critical care time)  Medications Ordered in ED Medications  sodium chloride flush (NS) 0.9 % injection 3 mL (has no administration in time range)  ibuprofen (ADVIL) tablet 600 mg (600 mg Oral Given 03/06/20 1730)  albuterol (VENTOLIN HFA) 108 (90 Base) MCG/ACT inhaler 6 puff (6 puffs Inhalation Given 03/06/20 1745)  benzonatate (TESSALON) capsule 200 mg (200 mg Oral Given 03/06/20 1731)    ED Course  I have reviewed the triage vital signs and the nursing notes.  Pertinent labs & imaging results that were available  during my care of the patient were reviewed by me and considered in my medical decision making (see chart for details).    MDM Rules/Calculators/A&P                      Ryan Maxwell was evaluated in Emergency Department on 03/06/2020 for the symptoms described in the history of present illness. He was evaluated in the context of the global COVID-19 pandemic, which necessitated consideration that the patient might be at risk for infection with the SARS-CoV-2 virus that causes COVID-19. Institutional protocols and algorithms that pertain to the evaluation of patients at risk for COVID-19 are in a state of rapid change based on information released by regulatory bodies including the CDC and federal and state organizations. These policies and algorithms were followed during the patient's care in the ED.   34 year old male comes in a chief complaint of fever, weakness, nausea, cough, shortness of breath.  He is also having body aches.  Symptoms consistent with viral syndrome, possibly COVID-19.  We will also get  x-ray to rule out pneumonia.  Basic lab work is also been sent to ensure there is no underlying renal failure or electrolyte abnormalities.  Reassessment: Nursing staff informed me that patient's O2 sats dropped to 85%. We have now requested that the COVID-19 test be expedited. Signing patient's care to incoming team.  If the patient has positive Covid then he will need admission.  If the Covid test is negative then he will need CT PE and admission.  Final Clinical Impression(s) / ED Diagnoses Final diagnoses:  COVID-19  Hypoxia    Rx / DC Orders ED Discharge Orders    None       Derwood Kaplan, MD 03/06/20 1756    Derwood Kaplan, MD 03/06/20 2240

## 2020-03-06 NOTE — ED Provider Notes (Signed)
Assumed care from Dr. Rhunette Croft at shift change.  See prior notes for full H&P.  Briefly, 34 y.o. M here with cough, fever, body aches, SOB.  Initially well appearing but while in the ED,  sats dropped to 85% while talking on the phone.  Initially placed on 2L but increased to 4L, however it was discovered that tubing was disconnected to wrong oxygen output.  Plan:  covid pending.  If negative, could consider CTA to assess for PE, CAP, etc.  Results for orders placed or performed during the hospital encounter of 03/06/20  SARS Coronavirus 2 by RT PCR (hospital order, performed in [facility] hospital lab) Nasopharyngeal Nasopharyngeal Swab   Specimen: Nasopharyngeal Swab  Result Value Ref Range   SARS Coronavirus 2 POSITIVE (A) NEGATIVE  Lactic acid, plasma  Result Value Ref Range   Lactic Acid, Venous 1.7 0.5 - 1.9 mmol/L  Comprehensive metabolic panel  Result Value Ref Range   Sodium 141 135 - 145 mmol/L   Potassium 3.6 3.5 - 5.1 mmol/L   Chloride 104 98 - 111 mmol/L   CO2 27 22 - 32 mmol/L   Glucose, Bld 118 (H) 70 - 99 mg/dL   BUN 10 6 - 20 mg/dL   Creatinine, Ser 9.67 0.61 - 1.24 mg/dL   Calcium 8.0 (L) 8.9 - 10.3 mg/dL   Total Protein 7.5 6.5 - 8.1 g/dL   Albumin 3.7 3.5 - 5.0 g/dL   AST 41 15 - 41 U/L   ALT 49 (H) 0 - 44 U/L   Alkaline Phosphatase 50 38 - 126 U/L   Total Bilirubin 0.5 0.3 - 1.2 mg/dL   GFR calc non Af Amer >60 >60 mL/min   GFR calc Af Amer >60 >60 mL/min   Anion gap 10 5 - 15  CBC with Differential  Result Value Ref Range   WBC 6.4 4.0 - 10.5 K/uL   RBC 5.05 4.22 - 5.81 MIL/uL   Hemoglobin 13.8 13.0 - 17.0 g/dL   HCT 59.1 63.8 - 46.6 %   MCV 94.1 80.0 - 100.0 fL   MCH 27.3 26.0 - 34.0 pg   MCHC 29.1 (L) 30.0 - 36.0 g/dL   RDW 59.9 35.7 - 01.7 %   Platelets 186 150 - 400 K/uL   nRBC 0.0 0.0 - 0.2 %   Neutrophils Relative % 53 %   Neutro Abs 3.4 1.7 - 7.7 K/uL   Lymphocytes Relative 27 %   Lymphs Abs 1.7 0.7 - 4.0 K/uL   Monocytes Relative 19 %   Monocytes Absolute 1.2 (H) 0.1 - 1.0 K/uL   Eosinophils Relative 0 %   Eosinophils Absolute 0.0 0.0 - 0.5 K/uL   Basophils Relative 0 %   Basophils Absolute 0.0 0.0 - 0.1 K/uL   Immature Granulocytes 1 %   Abs Immature Granulocytes 0.05 0.00 - 0.07 K/uL   Reactive, Benign Lymphocytes PRESENT    Smudge Cells PRESENT   Urinalysis, Routine w reflex microscopic  Result Value Ref Range   Color, Urine YELLOW YELLOW   APPearance TURBID (A) CLEAR   Specific Gravity, Urine 1.025 1.005 - 1.030   pH 5.0 5.0 - 8.0   Glucose, UA NEGATIVE NEGATIVE mg/dL   Hgb urine dipstick NEGATIVE NEGATIVE   Bilirubin Urine NEGATIVE NEGATIVE   Ketones, ur NEGATIVE NEGATIVE mg/dL   Protein, ur 30 (A) NEGATIVE mg/dL   Nitrite NEGATIVE NEGATIVE   Leukocytes,Ua NEGATIVE NEGATIVE   RBC / HPF 0-5 0 - 5 RBC/hpf  WBC, UA 0-5 0 - 5 WBC/hpf   Bacteria, UA NONE SEEN NONE SEEN   Mucus PRESENT    DG Chest Port 1 View  Result Date: 03/06/2020 CLINICAL DATA:  Fever and chills.  Body aches. EXAM: PORTABLE CHEST 1 VIEW COMPARISON:  November 03, 2018 FINDINGS: The heart size and mediastinal contours are within normal limits. Both lungs are clear. The visualized skeletal structures are unremarkable. IMPRESSION: No active disease. Electronically Signed   By: Dorise Bullion III M.D   On: 03/06/2020 17:46     10:49 PM covid is +.  Patient has been titrated down to 2L and is comfortable while talking on phone.  Maintaining sats of 96%.  Will plan to admit for further management given O2 requirement.  Discussed with hospitalist, Dr. Roel Cluck-- requested to add CTA to assess for PE given negative CXR.  She will admit patient for further management.   Larene Pickett, PA-C 03/07/20 [id number]    Lacretia Leigh, MD 03/09/20 2228

## 2020-03-06 NOTE — ED Notes (Signed)
Oxygen was placed via nasal cannula 2 liters by RN.

## 2020-03-06 NOTE — H&P (Signed)
Ryan Maxwell EXB:[id number] DOB: 1986-09-07 DOA: 03/06/2020     PCP: Patient, No Pcp Per   Outpatient Specialists:  NONE    Patient arrived to ER on 03/06/20 at 1609  Patient coming from: home Lives  With family    Chief Complaint:  Chief Complaint  Patient presents with  . Fever  . Nausea  . Emesis  . Generalized Body Aches    HPI: Ryan Maxwell is a 33 y.o. male with medical history significant of hypertension, bronchitis, obesity    Presented with fever chills generalized body aches nausea vomiting for the past 3 days he stopped smoking and thought that his symptoms were secondary to nicotine withdrawal.  Patient reports cough and shortness of breath He has not been vaccinated against Covid   Infectious risk factors:  Reports fever, shortness of breath, dry cough,  N/ abdominal pain,  Body aches, severe fatigue    Has  NOt been vaccinated against COVID   In  ER  COVID TEST   POSITIVE,     Lab Results  Component Value Date   SARSCOV2NAA POSITIVE (A) 03/06/2020     Regarding pertinent Chronic problems:       HTN on lisinopril has not been taking   While in ER: Found to be febrile up to 101.9 Slightly elevated LFTs Positive for Covid While in ER oxygen saturation dropped to 85%   Hospitalist was called for admission for COVID infection  The following Work up has been ordered so far:  Orders Placed This Encounter  Procedures  . Critical Care  . SARS CORONAVIRUS 2 (TAT 6-24 HRS) Nasopharyngeal Nasopharyngeal Swab  . SARS Coronavirus 2 by RT PCR (hospital order, performed in [facility] hospital lab) Nasopharyngeal Nasopharyngeal Swab  . DG Chest Port 1 View  . Lactic acid, plasma  . Comprehensive metabolic panel  . CBC with Differential  . Urinalysis, Routine w reflex microscopic  . Saline Lock IV, Maintain IV access  . Consult to hospitalist  ALL PATIENTS BEING ADMITTED/HAVING PROCEDURES NEED COVID-19 SCREENING  . Airborne and Contact  precautions     Following Medications were ordered in ER: Medications  sodium chloride flush (NS) 0.9 % injection 3 mL (has no administration in time range)  ibuprofen (ADVIL) tablet 600 mg (600 mg Oral Given 03/06/20 1730)  albuterol (VENTOLIN HFA) 108 (90 Base) MCG/ACT inhaler 6 puff (6 puffs Inhalation Given 03/06/20 1745)  benzonatate (TESSALON) capsule 200 mg (200 mg Oral Given 03/06/20 1731)        Consult Orders  (From admission, onward)         Start     Ordered   03/06/20 2253  Consult to hospitalist  ALL PATIENTS BEING ADMITTED/HAVING PROCEDURES NEED COVID-19 SCREENING  Once    Comments: ALL PATIENTS BEING ADMITTED/HAVING PROCEDURES NEED COVID-19 SCREENING  Provider:  (Not yet assigned)  Question Answer Comment  Place call to: [facility]   Reason for Consult Admit      03/06/20 2252          Significant initial  Findings: Abnormal Labs Reviewed  SARS CORONAVIRUS 2 BY RT PCR (HOSPITAL ORDER, PERFORMED IN [facility]) - Abnormal; Notable for the following components:      Result Value   SARS Coronavirus 2 POSITIVE (*)    All other components within normal limits  COMPREHENSIVE METABOLIC PANEL - Abnormal; Notable for the following components:   Glucose, Bld 118 (*)    Calcium 8.0 (*)  ALT 49 (*)    All other components within normal limits  CBC WITH DIFFERENTIAL/PLATELET - Abnormal; Notable for the following components:   MCHC 29.1 (*)    Monocytes Absolute 1.2 (*)    All other components within normal limits  URINALYSIS, ROUTINE W REFLEX MICROSCOPIC - Abnormal; Notable for the following components:   APPearance TURBID (*)    Protein, ur 30 (*)    All other components within normal limits    Otherwise labs showing:    Recent Labs  Lab 03/06/20 1633  NA 141  K 3.6  CO2 27  GLUCOSE 118*  BUN 10  CREATININE 0.91  CALCIUM 8.0*    Cr   stable,    Lab Results  Component Value Date   CREATININE 0.91 03/06/2020   CREATININE 0.70  11/03/2018   CREATININE 0.84 04/16/2017    Recent Labs  Lab 03/06/20 1633  AST 41  ALT 49*  ALKPHOS 50  BILITOT 0.5  PROT 7.5  ALBUMIN 3.7   Lab Results  Component Value Date   CALCIUM 8.0 (L) 03/06/2020     WBC      Component Value Date/Time   WBC 6.4 03/06/2020 1633   ANC    Component Value Date/Time   NEUTROABS 3.4 03/06/2020 1633   ALC No components found for: LYMPHAB    Plt: Lab Results  Component Value Date   PLT 186 03/06/2020    Lactic Acid, Venous    Component Value Date/Time   LATICACIDVEN 1.7 03/06/2020 1633    Procalcitonin Ordered   COVID-19 Labs  No results for input(s): DDIMER, FERRITIN, LDH, CRP in the last 72 hours.  Lab Results  Component Value Date   SARSCOV2NAA POSITIVE (A) 03/06/2020     HG/HCT   Stable     Component Value Date/Time   HGB 13.8 03/06/2020 1633   HCT 47.5 03/06/2020 1633    Troponin  ordered     ECG: Ordered    UA   no evidence of UTI      Urine analysis:    Component Value Date/Time   COLORURINE YELLOW 03/06/2020 1633   APPEARANCEUR TURBID (A) 03/06/2020 1633   LABSPEC 1.025 03/06/2020 1633   PHURINE 5.0 03/06/2020 1633   GLUCOSEU NEGATIVE 03/06/2020 1633   HGBUR NEGATIVE 03/06/2020 1633   BILIRUBINUR NEGATIVE 03/06/2020 1633   KETONESUR NEGATIVE 03/06/2020 1633   PROTEINUR 30 (A) 03/06/2020 1633   NITRITE NEGATIVE 03/06/2020 1633   LEUKOCYTESUR NEGATIVE 03/06/2020 1633          CXR -  NON acute   CTA chest - Ordered ED Triage Vitals  Enc Vitals Group     BP 03/06/20 1619 (!) 162/92     Pulse Rate 03/06/20 1619 86     Resp 03/06/20 1619 (!) 22     Temp 03/06/20 1619 (!) 101.9 F (38.8 C)     Temp Source 03/06/20 1619 Oral     SpO2 03/06/20 1619 90 %     Weight --      Height --      Head Circumference --      Peak Flow --      Pain Score 03/06/20 1630 10     Pain Loc --      Pain Edu? --      Excl. in GC? --   TMAX(24)@      Latest  Blood pressure 135/81, pulse 88, temperature  (!) 101.9 F (38.8 C), temperature source Oral,  resp. rate 20, SpO2 96 %.    Review of Systems:    Pertinent positives include: Fevers, chills, fatigue abdominal pain, nausea, shortness of breath at rest.dyspnea on exertion, non-productive cough, Constitutional:  No weight loss, night sweats, , weight loss  HEENT:  No headaches, Difficulty swallowing,Tooth/dental problems,Sore throat,  No sneezing, itching, ear ache, nasal congestion, post nasal drip,  Cardio-vascular:  No chest pain, Orthopnea, PND, anasarca, dizziness, palpitations.no Bilateral lower extremity swelling  GI:  No heartburn, indigestion,  vomiting, diarrhea, change in bowel habits, loss of appetite, melena, blood in stool, hematemesis Resp:    No excess mucus, no productive cough, No No coughing up of blood.No change in color of mucus.No wheezing. Skin:  no rash or lesions. No jaundice GU:  no dysuria, change in color of urine, no urgency or frequency. No straining to urinate.  No flank pain.  Musculoskeletal:  No joint pain or no joint swelling. No decreased range of motion. No back pain.  Psych:  No change in mood or affect. No depression or anxiety. No memory loss.  Neuro: no localizing neurological complaints, no tingling, no weakness, no double vision, no gait abnormality, no slurred speech, no confusion  All systems reviewed and apart from [place] all are negative  Past Medical History:   Past Medical History:  Diagnosis Date  . Bronchitis   . Hypertension       History reviewed. No pertinent surgical history.  Social History:  Ambulatory  Independently     reports that he has been smoking cigarettes. He has been smoking about 1.00 pack per day. He has never used smokeless tobacco. He reports current drug use. Drug: Marijuana. He reports that he does not drink alcohol.     Family History:   Family History  Problem Relation Age of Onset  . Hypertension Other     Allergies: Allergies    Allergen Reactions  . Dayquil [Pseudoephedrine-Apap-Dm] Shortness Of Breath  . Nyquil Multi-Symptom [Pseudoeph-Doxylamine-Dm-Apap] Shortness Of Breath     Prior to Admission medications   Medication Sig Start Date End Date Taking? Authorizing Provider  beclomethasone (QVAR) 40 MCG/ACT inhaler Inhale 2 puffs into the lungs daily as needed (for shortness of breath). 11/03/18  Yes Merlyn Lot, MD  ibuprofen (ADVIL,MOTRIN) 200 MG tablet Take 600 mg by mouth every 6 (six) hours as needed for fever, headache, mild pain, moderate pain or cramping.    Yes [provider]  albuterol (PROVENTIL HFA;VENTOLIN HFA) 108 (90 Base) MCG/ACT inhaler Inhale 2 puffs into the lungs every 6 (six) hours as needed for wheezing or shortness of breath. Patient not taking: Reported on 03/06/2020 11/03/18   Merlyn Lot, MD  amoxicillin-clavulanate (AUGMENTIN) 875-125 MG tablet Take 1 tablet by mouth every 12 (twelve) hours. Patient not taking: Reported on 03/06/2020 07/24/19   Petrucelli, Aldona Bar R, PA-C  lisinopril (PRINIVIL,ZESTRIL) 5 MG tablet Take 1 tablet (5 mg total) by mouth daily. Patient not taking: Reported on 04/16/2017 04/13/17 04/13/18  Lavonia Drafts, MD  naproxen (NAPROSYN) 500 MG tablet Take 1 tablet (500 mg total) by mouth 2 (two) times daily. Patient not taking: Reported on 03/06/2020 07/24/19   Petrucelli, Glynda Jaeger, PA-C   Physical Exam: Blood pressure 135/81, pulse 88, temperature (!) 101.9 F (38.8 C), temperature source Oral, resp. rate 20, SpO2 96 %. 1. General:  in No  Acute distress   Chronically ill -appearing 2. Psychological: Alert and   Oriented 3. Head/ENT:    Dry Mucous Membranes  Head Non traumatic, neck supple                           Poor Dentition 4. SKIN: normal   Skin turgor,  Skin clean Dry and intact no rash 5. Heart: Regular rate and rhythm no Murmur, no Rub or gallop 6. Lungs  Clear to auscultation bilaterally, no wheezes or crackles    7. Abdomen: Soft, non-tender, Non distended   obese  8. Lower extremities: no clubbing, cyanosis, no  edema 9. Neurologically Grossly intact, moving all 4 extremities equally   10. MSK: Normal range of motion   All other LABS:     Recent Labs  Lab 03/06/20 1633  WBC 6.4  NEUTROABS 3.4  HGB 13.8  HCT 47.5  MCV 94.1  PLT 186     Recent Labs  Lab 03/06/20 1633  NA 141  K 3.6  CL 104  CO2 27  GLUCOSE 118*  BUN 10  CREATININE 0.91  CALCIUM 8.0*     Recent Labs  Lab 03/06/20 1633  AST 41  ALT 49*  ALKPHOS 50  BILITOT 0.5  PROT 7.5  ALBUMIN 3.7       Cultures:    Component Value Date/Time   SDES BLOOD RIGHT HAND 11/20/2016 0542   SDES BLOOD RIGHT AC 11/20/2016 0542   SPECREQUEST BOTTLES DRAWN AEROBIC AND ANAEROBIC BCHV 11/20/2016 0542   SPECREQUEST BOTTLES DRAWN AEROBIC AND ANAEROBIC BCAV 11/20/2016 0542   CULT NO GROWTH 5 DAYS 11/20/2016 0542   CULT NO GROWTH 5 DAYS 11/20/2016 0542   REPTSTATUS 11/25/2016 FINAL 11/20/2016 0542   REPTSTATUS 11/25/2016 FINAL 11/20/2016 0542     Radiological Exams on Admission: DG Chest Port 1 View  Result Date: 03/06/2020 CLINICAL DATA:  Fever and chills.  Body aches. EXAM: PORTABLE CHEST 1 VIEW COMPARISON:  November 03, 2018 FINDINGS: The heart size and mediastinal contours are within normal limits. Both lungs are clear. The visualized skeletal structures are unremarkable. IMPRESSION: No active disease. Electronically Signed   By: Gerome Sam III M.D   On: 03/06/2020 17:46    Chart has been reviewed   Assessment/Plan   34 y.o. male with medical history significant of hypertension, bronchitis, obesity  Admitted for acute respiratory failure secondary to Covid  Present on Admission: . COVID-19 virus infection -  Ordered 02/3020 and is  positive   Following concerning LAB/ imaging findings:      LFTs: increased AST/ALT/Tbili   CRP, LDH: pending IL-6 and Ferritin pending Procalcitonin:  pending       -Following work-up initiated:      sputum cultures  Ordered 03/07/20, Blood cultures  Ordered 03/07/20,   CTA ordred   Following complications noted:     elevated LFT's mild likely in the setting of COVID continue to follow   Nausea decrease PO intake    acute respiratory failure with hypoxia - continue oxygen treatment  Plan of treatment:    Admit on Airborn Precautions to Current facility   -given severity of illness initiate steroids Decadron 6mg  q 24 hours And pharmacy consult for remdesivir   - Will follow daily d.dimer - Assess for ability to prone  - Supportive management -Fluid sparing resuscitation  -Provide oxygen as needed currently on 4L SpO2: 96 % - IF d.dimer elvated >5 will increase dose of lovenox    Poor Prognostic factors  34 y.o.  Personal hx of  COPD/bronchitis, HTN, obesity  Will order Airborne and Contact precautions      The treatment plan and use of medications and known side effects were discussed with patient  It was clearly explained that there is no proven definitive treatment for COVID-19 infection yet. Any medications used here are based on case reports/anecdotal data which are not peer-reviewed and has not been studied using randomized control trials.  Complete risks and long-term side effects are unknown, however in the best clinical judgment they seem to be of some clinical benefit rather than medical risks.  Patient gree with the treatment plan and want to receive these treatments as indicated.    . Acute respiratory failure with hypoxia (HCC) - likely due to COVID, given the lagging chest x-ray will obtain CT chest to rule out PE  . Essential hypertension -patient has not been taking medications at home.  Will observe and monitor blood pressure if persistent hypertension will resume home medication   Other plan as per orders.  DVT prophylaxis:    Lovenox     Code Status:  FULL CODE as per patient  I had personally discussed CODE  STATUS with patient   Family Communication:   Family not at  Bedside    Disposition Plan:   To home once workup is complete and patient is stable   Following barriers for discharge:                                                        Hypoxia improved                               Afebrile,  able to transition to PO antibiotics                                                 Will likely need home health, home O2, set up                         Consults called: none  Admission status:  ED Disposition    ED Disposition Condition Comment   Admit  Hospital Area: [facility] [[id number]]  Level of Care: Telemetry [5]  Admit to tele based on following criteria: Other see comments  Comments: covid  May admit patient to [facility] or [facility] if equivalent level of care is available:: No  Covid Evaluation: Confirmed COVID Positive  Diagnosis: COVID-19 virus infection [[id number]]  Admitting Physician: Therisa Doyne [[id number]]  Attending Physician: Therisa Doyne [[id number]]  Estimated length of stay: past midnight tomorrow  Certification:: I certify this patient will need inpatient services for at least 2 midnights          inpatient     I Expect 2 midnight stay secondary to severity of patient's current illness need for inpatient interventions justified by the following:  hemodynamic instability despite optimal treatment (  Hypoxia )   and extensive comorbidities including:  COPD/asthma   That are currently affecting medical management.   I expect  patient to be hospitalized for 2 midnights requiring inpatient medical care.  Patient is at high  risk for adverse outcome (such as loss of life or disability) if not treated.  Indication for inpatient stay as follows:    New or worsening hypoxia  Need for IV antivirals     Level of care    Tele  indefinitely please discontinue once patient no longer qualifies   Precautions: admitted as  covid  positive Airborne and Contact precautions    PPE: Used by the provider:   P100  eye Goggles,  Gloves  gown    Ryan Maxwell 03/07/2020, 12:03 AM    [facility]     after 2 AM please page floor coverage PA If 7AM-7PM, please contact the day team taking care of the patient using [email or web address]   Patient was evaluated in the context of the global COVID-19 pandemic, which necessitated consideration that the patient might be at risk for infection with the SARS-CoV-2 virus that causes COVID-19. Institutional protocols and algorithms that pertain to the evaluation of patients at risk for COVID-19 are in a state of rapid change based on information released by regulatory bodies including the CDC and federal and state organizations. These policies and algorithms were followed during the patient's care.

## 2020-03-06 NOTE — ED Triage Notes (Signed)
Patient here from home reporting fever, chills, generalized body aches, n/v since Thursday after he stopped smoking. Patient reports "I think im withdrawing from nicotine".

## 2020-03-06 NOTE — ED Notes (Signed)
Pt unable to void, urinal given; will continue to monitor.

## 2020-03-07 ENCOUNTER — Encounter (HOSPITAL_COMMUNITY): Payer: Self-pay | Admitting: Internal Medicine

## 2020-03-07 ENCOUNTER — Inpatient Hospital Stay (HOSPITAL_COMMUNITY): Payer: HRSA Program

## 2020-03-07 LAB — FERRITIN: Ferritin: 205 ng/mL (ref 24–336)

## 2020-03-07 LAB — COMPREHENSIVE METABOLIC PANEL
ALT: 49 U/L — ABNORMAL HIGH (ref 0–44)
AST: 40 U/L (ref 15–41)
Albumin: 3.6 g/dL (ref 3.5–5.0)
Alkaline Phosphatase: 48 U/L (ref 38–126)
Anion gap: 9 (ref 5–15)
BUN: 11 mg/dL (ref 6–20)
CO2: 29 mmol/L (ref 22–32)
Calcium: 8.1 mg/dL — ABNORMAL LOW (ref 8.9–10.3)
Chloride: 105 mmol/L (ref 98–111)
Creatinine, Ser: 0.96 mg/dL (ref 0.61–1.24)
GFR calc Af Amer: >60 mL/min (ref >60)
GFR calc non Af Amer: >60 mL/min (ref >60)
Glucose, Bld: 100 mg/dL — ABNORMAL HIGH (ref 70–99)
Potassium: 3.8 mmol/L (ref 3.5–5.1)
Sodium: 143 mmol/L (ref 135–145)
Total Bilirubin: 0.7 mg/dL (ref 0.3–1.2)
Total Protein: 7.3 g/dL (ref 6.5–8.1)

## 2020-03-07 LAB — CBC WITH DIFFERENTIAL/PLATELET
Abs Immature Granulocytes: 0.08 10*3/uL — ABNORMAL HIGH (ref 0.00–0.07)
Basophils Absolute: 0 10*3/uL (ref 0.0–0.1)
Basophils Relative: 0 %
Eosinophils Absolute: 0 10*3/uL (ref 0.0–0.5)
Eosinophils Relative: 0 %
HCT: 47.1 % (ref 39.0–52.0)
Hemoglobin: 13.9 g/dL (ref 13.0–17.0)
Immature Granulocytes: 1 %
Lymphocytes Relative: 23 %
Lymphs Abs: 1.7 10*3/uL (ref 0.7–4.0)
MCH: 27.3 pg (ref 26.0–34.0)
MCHC: 29.5 g/dL — ABNORMAL LOW (ref 30.0–36.0)
MCV: 92.5 fL (ref 80.0–100.0)
Monocytes Absolute: 1 10*3/uL (ref 0.1–1.0)
Monocytes Relative: 14 %
Neutro Abs: 4.4 10*3/uL (ref 1.7–7.7)
Neutrophils Relative %: 62 %
Platelets: 188 10*3/uL (ref 150–400)
RBC: 5.09 MIL/uL (ref 4.22–5.81)
RDW: 13 % (ref 11.5–15.5)
WBC: 7.2 10*3/uL (ref 4.0–10.5)
nRBC: 0 % (ref 0.0–0.2)

## 2020-03-07 LAB — LIPID PANEL
Cholesterol: 126 mg/dL (ref 0–200)
HDL: 25 mg/dL — ABNORMAL LOW (ref >40)
LDL Cholesterol: 86 mg/dL (ref 0–99)
Total CHOL/HDL Ratio: 5 RATIO
Triglycerides: 77 mg/dL (ref <150)
VLDL: 15 mg/dL (ref 0–40)

## 2020-03-07 LAB — HIV ANTIBODY (ROUTINE TESTING W REFLEX): HIV Screen 4th Generation wRfx: NONREACTIVE

## 2020-03-07 LAB — ABO/RH: ABO/RH(D): O POS

## 2020-03-07 LAB — TROPONIN I (HIGH SENSITIVITY): Troponin I (High Sensitivity): 15 ng/L (ref <18)

## 2020-03-07 LAB — SARS CORONAVIRUS 2 (TAT 6-24 HRS): SARS Coronavirus 2: POSITIVE — AB

## 2020-03-07 LAB — MAGNESIUM: Magnesium: 2.4 mg/dL (ref 1.7–2.4)

## 2020-03-07 LAB — D-DIMER, QUANTITATIVE: D-Dimer, Quant: 1.87 ug/mL-FEU — ABNORMAL HIGH (ref 0.00–0.50)

## 2020-03-07 LAB — C-REACTIVE PROTEIN: CRP: 2 mg/dL — ABNORMAL HIGH (ref <1.0)

## 2020-03-07 MED ORDER — ONDANSETRON HCL 4 MG/2ML IJ SOLN: 4.0000 mg | Freq: Four times a day (QID) | INTRAMUSCULAR | Status: DC | PRN

## 2020-03-07 MED ORDER — SODIUM CHLORIDE 0.9 % IV SOLN
100.0000 mg | Freq: Once | INTRAVENOUS | Status: AC
  Administered 2020-03-07: 100 mg via INTRAVENOUS

## 2020-03-07 MED ORDER — SODIUM CHLORIDE 0.9% FLUSH
3.0000 mL | Freq: Two times a day (BID) | INTRAVENOUS | Status: DC
  Administered 2020-03-07 – 2020-03-11 (×10): 3 mL via INTRAVENOUS

## 2020-03-07 MED ORDER — ACETAMINOPHEN 325 MG PO TABS: 650.0000 mg | ORAL_TABLET | Freq: Four times a day (QID) | ORAL | Status: DC | PRN

## 2020-03-07 MED ORDER — SODIUM CHLORIDE 0.9 % IV SOLN
100.0000 mg | Freq: Every day | INTRAVENOUS | Status: AC
  Administered 2020-03-08 – 2020-03-10 (×3): 100 mg via INTRAVENOUS
  Filled 2020-03-07 (×3): qty 20

## 2020-03-07 MED ORDER — SODIUM CHLORIDE 0.9 % IV SOLN: 250.0000 mL | INTRAVENOUS | Status: DC | PRN

## 2020-03-07 MED ORDER — ONDANSETRON HCL 4 MG PO TABS: 4.0000 mg | ORAL_TABLET | Freq: Four times a day (QID) | ORAL | Status: DC | PRN

## 2020-03-07 MED ORDER — IOHEXOL 350 MG/ML SOLN
100.0000 mL | Freq: Once | INTRAVENOUS | Status: AC | PRN
  Administered 2020-03-07: 100 mL via INTRAVENOUS

## 2020-03-07 MED ORDER — SODIUM CHLORIDE 0.9% FLUSH: 3.0000 mL | INTRAVENOUS | Status: DC | PRN

## 2020-03-07 MED ORDER — SODIUM CHLORIDE 0.9 % IV SOLN
100.0000 mg | INTRAVENOUS | Status: AC
  Administered 2020-03-07 (×2): 100 mg via INTRAVENOUS
  Filled 2020-03-07 (×2): qty 20

## 2020-03-07 MED ORDER — ENOXAPARIN SODIUM 40 MG/0.4ML ~~LOC~~ SOLN
40.0000 mg | SUBCUTANEOUS | Status: DC
  Administered 2020-03-07 – 2020-03-11 (×5): 40 mg via SUBCUTANEOUS
  Filled 2020-03-07 (×3): qty 0.4

## 2020-03-07 MED ORDER — HYDROCODONE-ACETAMINOPHEN 5-325 MG PO TABS: 1.0000 | ORAL_TABLET | ORAL | Status: DC | PRN

## 2020-03-07 MED ORDER — SODIUM CHLORIDE 0.9% FLUSH
3.0000 mL | Freq: Two times a day (BID) | INTRAVENOUS | Status: DC
  Administered 2020-03-07 – 2020-03-09 (×3): 3 mL via INTRAVENOUS

## 2020-03-07 NOTE — Progress Notes (Signed)
PROGRESS NOTE  Ryan Maxwell  BWG:[id number] DOB: Mar 09, 1986 DOA: 03/06/2020 PCP: Patient, No Pcp Per  Brief Narrative: Ryan Maxwell is a 34 y.o. male with a history of bronchitis, tobacco use, and HTN not on medication who presented to the ED 5/30 with fever, chills, malaise, and subsequent shortness of breath worse on exertion that began 5/28 after being exposed to his nephew who had recently come out of isolation from covid-19 infection. He also had nausea and vomiting. In the ED he was hypoxemic requiring 2L O2 with no significant CXR findings, though elevated d-dimer inspired CTA chest which revealed no PE but R > L peripheral-predominant GGOs confirmed to be due to covid-19 pneumonia after SARS-CoV-2 testing was positive. Remdesivir and decadron were started.  Assessment & Plan: Active Problems:   COVID-19 virus infection   Acute respiratory failure with hypoxia (HCC)   Essential hypertension  Acute hypoxemic respiratory failure due to covid-19 pneumonia: SARS-CoV-2 PCR positive on 5/30, onset of illness 5/28. Inflammatory markers only modestly elevated, though he has risk factors for severe disease and is still early in disease course. - Continue remdesivir x5 days (5/30 - 6/3) - Continue decadron, wean oxygen if able. He appears to fall into the happy hypoxic phenotype, so will need monitoring of SpO2.  - Vitamin C, zinc - Encourage OOB, IS, FV, and awake proning if able - Tylenol and antitussives prn - Isolation period recommended for 21 days from positive testing. - Check CBC w/diff, CMP, CRP daily - Enoxaparin prophylactic dose.  - Maintain euvolemia/net negative.  - Avoid NSAIDs   History of bronchitis: Not currently wheezing.  - Steroids as above.  HTN: Not on medications.   Tobacco use: Quit cold [place] just before symptoms on 5/25. Does not desire nicotine patch.  - Encouraged continued cessation  Obesity: Estimated body mass index is 38.65 kg/m as calculated  from the following:   Height as of 07/24/19: 6' (1.829 m).   Weight as of 07/24/19: 129.3 kg.  DVT prophylaxis: Lovenox Code Status: Full Family Communication: None at bedside Disposition Plan:  Status is: Inpatient  Remains inpatient appropriate because:IV treatments appropriate due to intensity of illness or inability to take PO and Remains newly hypoxemic from covid pneumonia and will require close monitoring for several days as he's in early course of illness.   Dispo: The patient is from: Home              Anticipated d/c is to: Home              Anticipated d/c date is: 3 days              Patient currently is not medically stable to d/c.  Consultants:   None  Procedures:   None  Antimicrobials:  Remdesivir   Subjective: Shortness of breath is minimal, though he was hypoxic in ED without being completely aware. No chest pain, abdominal pain. Had mild nausea but no vomiting when eating some breakfast which is an improvement. Fatigue also improved.   Objective: Vitals:   03/07/20 0616 03/07/20 0900 03/07/20 0905 03/07/20 1044  BP: 136/79   (!) 152/97  Pulse: (!) 102   92  Resp: 20 (!) 22 (!) 22 14  Temp: 99.4 F (37.4 C)   98.9 F (37.2 C)  TempSrc: Oral   Oral  SpO2: 96% (!) 87% 92% 95%    Intake/Output Summary (Last 24 hours) at 03/07/2020 1432 Last data filed at 03/07/2020 0617 Gross per  24 hour  Intake 200 ml  Output --  Net 200 ml   There were no vitals filed for this visit.  Gen: 34 y.o. male in no distress  Pulm: Tachypnea with supplemental oxygen, distant throughout without wheezes or crackles.  CV: Regular tachycardia. No murmur, rub, or gallop. No JVD, no pitting pedal edema. GI: Abdomen soft, non-tender, non-distended, with normoactive bowel sounds. No organomegaly or masses felt. Ext: Warm, no deformities Skin: No rashes, lesions ulcers Neuro: Alert and oriented. No focal neurological deficits. Psych: Judgement and insight appear normal.  Mood & affect appropriate.   Data Reviewed: I have personally reviewed following labs and imaging studies  CBC: Recent Labs  Lab 03/06/20 1633 03/07/20 0328  WBC 6.4 7.2  NEUTROABS 3.4 4.4  HGB 13.8 13.9  HCT 47.5 47.1  MCV 94.1 92.5  PLT 186 188   Basic Metabolic Panel: Recent Labs  Lab 03/06/20 1633 03/07/20 0328  NA 141 143  K 3.6 3.8  CL 104 105  CO2 27 29  GLUCOSE 118* 100*  BUN 10 11  CREATININE 0.91 0.96  CALCIUM 8.0* 8.1*  MG  --  2.4   GFR: CrCl cannot be calculated (Unknown ideal weight.). Liver Function Tests: Recent Labs  Lab 03/06/20 1633 03/07/20 0328  AST 41 40  ALT 49* 49*  ALKPHOS 50 48  BILITOT 0.5 0.7  PROT 7.5 7.3  ALBUMIN 3.7 3.6   No results for input(s): LIPASE, AMYLASE in the last 168 hours. No results for input(s): AMMONIA in the last 168 hours. Coagulation Profile: No results for input(s): INR, PROTIME in the last 168 hours. Cardiac Enzymes: No results for input(s): CKTOTAL, CKMB, CKMBINDEX, TROPONINI in the last 168 hours. BNP (last 3 results) No results for input(s): PROBNP in the last 8760 hours. HbA1C: No results for input(s): HGBA1C in the last 72 hours. CBG: No results for input(s): GLUCAP in the last 168 hours. Lipid Profile: Recent Labs    03/07/20 0354  CHOL 126  HDL 25*  LDLCALC 86  TRIG 77  CHOLHDL 5.0   Thyroid Function Tests: No results for input(s): TSH, T4TOTAL, FREET4, T3FREE, THYROIDAB in the last 72 hours. Anemia Panel: Recent Labs    03/07/20 0328  FERRITIN 205   Urine analysis:    Component Value Date/Time   COLORURINE YELLOW 03/06/2020 1633   APPEARANCEUR TURBID (A) 03/06/2020 1633   LABSPEC 1.025 03/06/2020 1633   PHURINE 5.0 03/06/2020 1633   GLUCOSEU NEGATIVE 03/06/2020 1633   HGBUR NEGATIVE 03/06/2020 1633   BILIRUBINUR NEGATIVE 03/06/2020 1633   KETONESUR NEGATIVE 03/06/2020 1633   PROTEINUR 30 (A) 03/06/2020 1633   NITRITE NEGATIVE 03/06/2020 1633   LEUKOCYTESUR NEGATIVE  03/06/2020 1633   Recent Results (from the past 240 hour(s))  SARS Coronavirus 2 by RT PCR (hospital order, performed in [facility] hospital lab) Nasopharyngeal Nasopharyngeal Swab     Status: Abnormal   Collection Time: 03/06/20  5:45 PM   Specimen: Nasopharyngeal Swab  Result Value Ref Range Status   SARS Coronavirus 2 POSITIVE (A) NEGATIVE Final    Comment: RESULT CALLED TO, READ BACK BY AND VERIFIED WITH: J.Princella Ion [id number] @2232  BY V.WILKINS (NOTE) SARS-CoV-2 target nucleic acids are DETECTED SARS-CoV-2 RNA is generally detectable in upper respiratory specimens  during the acute phase of infection.  Positive results are indicative  of the presence of the identified virus, but do not rule out bacterial infection or co-infection with other pathogens not detected by the test.  Clinical correlation with  patient history and  other diagnostic information is necessary to determine patient infection status.  The expected result is negative. Fact Sheet for Patients:   BoilerBrush.com.cy  Fact Sheet for Healthcare Providers:   https://pope.com/   This test is not yet approved or cleared by the [place] FDA and  has been authorized for detection and/or diagnosis of SARS-CoV-2 by FDA under an Emergency Use Authorization (EUA).  This EUA will remain in effect (meaning this tes t can be used) for the duration of  the COVID-19 declaration under Section 564(b)(1) of the Act, 21 U.S.C. section 360-bbb-3(b)(1), unless the authorization is terminated or revoked sooner. Performed at [facility], [address] [place], [place] [address]   Culture, blood (Routine X 2) w Reflex to ID Panel     Status: None (Preliminary result)   Collection Time: 03/07/20  3:28 AM   Specimen: BLOOD RIGHT FOREARM  Result Value Ref Range Status   Specimen Description   Final    BLOOD RIGHT FOREARM Performed at [facility], [address] [place], [place] [address]    Special Requests   Final    BOTTLES DRAWN AEROBIC ONLY Blood Culture adequate volume Performed at [facility], [address] [place], [place] [address]    Culture   Final    NO GROWTH < 12 HOURS Performed at [facility], [address], [place], [place] [address]    Report Status PENDING  Incomplete  Culture, blood (Routine X 2) w Reflex to ID Panel     Status: None (Preliminary result)   Collection Time: 03/07/20  3:28 AM   Specimen: BLOOD  Result Value Ref Range Status   Specimen Description   Final    BLOOD RIGHT ANTECUBITAL Performed at [facility], [address] [place], [place] [address]    Special Requests   Final    BOTTLES DRAWN AEROBIC ONLY Blood Culture results may not be optimal due to an inadequate volume of blood received in culture bottles Performed at [facility], [address] [place], [place] [address]    Culture   Final    NO GROWTH < 12 HOURS Performed at [facility], [address], [place], [place] [address]    Report Status PENDING  Incomplete      Radiology Studies: CT Angio Chest PE W and/or Wo Contrast  Result Date: 03/07/2020 CLINICAL DATA:  Shortness of breath COVID positive EXAM: CT ANGIOGRAPHY CHEST WITH CONTRAST TECHNIQUE: Multidetector CT imaging of the chest was performed using the standard protocol during bolus administration of intravenous contrast. Multiplanar CT image reconstructions and MIPs were obtained to evaluate the vascular anatomy. CONTRAST:  OMNIPAQUE IOHEXOL 350 MG/ML SOLN COMPARISON:  Mar 06, 2020 FINDINGS: Cardiovascular: There is slightly suboptimal opacification of the main pulmonary artery. No central or proximal segmental pulmonary embolism is seen. The heart is normal in size. No pericardial effusion or thickening. No evidence right heart strain. There is normal three-vessel brachiocephalic  anatomy without proximal stenosis. The thoracic aorta is normal in appearance. Mediastinum/Nodes: Scattered bilateral hilar lymph nodes are seen the largest in the right hilum measuring 1.8 cm in transverse dimension, series 4, image 59. Thyroid gland, trachea, and esophagus demonstrate no significant findings. Lungs/Pleura: Mild peripherally based ground-glass opacities are seen throughout the periphery of the right lower lung and bilateral lung bases, right greater than left. No pleural effusion or pneumothorax. Upper Abdomen: No  acute abnormalities present in the visualized portions of the upper abdomen. Musculoskeletal: No chest wall abnormality. No acute or significant osseous findings. Review of the MIP images confirms the above findings. IMPRESSION: Slightly suboptimal opacification of the main pulmonary artery. No central or proximal segmental pulmonary embolism. Patchy ground-glass opacities, right greater than left, consistent with multifocal pneumonia Right hilar adenopathy.  The Electronically Signed   By: Jonna Clark M.D.   On: 03/07/2020 01:23   DG Chest Port 1 View  Result Date: 03/06/2020 CLINICAL DATA:  Fever and chills.  Body aches. EXAM: PORTABLE CHEST 1 VIEW COMPARISON:  November 03, 2018 FINDINGS: The heart size and mediastinal contours are within normal limits. Both lungs are clear. The visualized skeletal structures are unremarkable. IMPRESSION: No active disease. Electronically Signed   By: Gerome Sam III M.D   On: 03/06/2020 17:46    Scheduled Meds: . vitamin C  500 mg Oral Daily  . dexamethasone (DECADRON) injection  6 mg Intravenous Q24H  . enoxaparin (LOVENOX) injection  40 mg Subcutaneous Q24H  . sodium chloride flush  3 mL Intravenous Once  . sodium chloride flush  3 mL Intravenous Q12H  . sodium chloride flush  3 mL Intravenous Q12H  . zinc sulfate  220 mg Oral Daily   Continuous Infusions: . sodium chloride    . remdesivir 100 mg in NS 100 mL     Followed by   . [START ON 03/08/2020] remdesivir 100 mg in NS 100 mL       LOS: 1 day   Time spent: 25 minutes.  Tyrone Nine, MD [facility] [email or web address] 03/07/2020, 2:32 PM

## 2020-03-07 NOTE — Progress Notes (Signed)
Plan of care reviewed with patient. Instructed on use of I/S and acapella device using 10x every hour. Pt educated on benefits of proning when in bed. Verbalized understanding of all instructions.

## 2020-03-08 LAB — BLOOD CULTURE ID PANEL (REFLEXED)

## 2020-03-08 LAB — CBC WITH DIFFERENTIAL/PLATELET
Abs Immature Granulocytes: 0.06 10*3/uL (ref 0.00–0.07)
Basophils Absolute: 0 10*3/uL (ref 0.0–0.1)
Basophils Relative: 0 %
Eosinophils Absolute: 0 10*3/uL (ref 0.0–0.5)
Eosinophils Relative: 0 %
HCT: 49.2 % (ref 39.0–52.0)
Hemoglobin: 14.8 g/dL (ref 13.0–17.0)
Immature Granulocytes: 1 %
Lymphocytes Relative: 19 %
Lymphs Abs: 1.2 10*3/uL (ref 0.7–4.0)
MCH: 27.3 pg (ref 26.0–34.0)
MCHC: 30.1 g/dL (ref 30.0–36.0)
MCV: 90.6 fL (ref 80.0–100.0)
Monocytes Absolute: 0.8 10*3/uL (ref 0.1–1.0)
Monocytes Relative: 13 %
Neutro Abs: 4.2 10*3/uL (ref 1.7–7.7)
Neutrophils Relative %: 67 %
Platelets: 215 10*3/uL (ref 150–400)
RBC: 5.43 MIL/uL (ref 4.22–5.81)
RDW: 12.7 % (ref 11.5–15.5)
WBC: 6.3 10*3/uL (ref 4.0–10.5)
nRBC: 0 % (ref 0.0–0.2)

## 2020-03-08 LAB — COMPREHENSIVE METABOLIC PANEL
ALT: 57 U/L — ABNORMAL HIGH (ref 0–44)
AST: 41 U/L (ref 15–41)
Albumin: 3.7 g/dL (ref 3.5–5.0)
Alkaline Phosphatase: 52 U/L (ref 38–126)
Anion gap: 9 (ref 5–15)
BUN: 12 mg/dL (ref 6–20)
CO2: 30 mmol/L (ref 22–32)
Calcium: 8.7 mg/dL — ABNORMAL LOW (ref 8.9–10.3)
Chloride: 102 mmol/L (ref 98–111)
Creatinine, Ser: 0.75 mg/dL (ref 0.61–1.24)
GFR calc Af Amer: >60 mL/min (ref >60)
GFR calc non Af Amer: >60 mL/min (ref >60)
Glucose, Bld: 138 mg/dL — ABNORMAL HIGH (ref 70–99)
Potassium: 4.5 mmol/L (ref 3.5–5.1)
Sodium: 141 mmol/L (ref 135–145)
Total Bilirubin: 0.4 mg/dL (ref 0.3–1.2)
Total Protein: 8.1 g/dL (ref 6.5–8.1)

## 2020-03-08 LAB — D-DIMER, QUANTITATIVE: D-Dimer, Quant: 1.62 ug/mL-FEU — ABNORMAL HIGH (ref 0.00–0.50)

## 2020-03-08 LAB — C-REACTIVE PROTEIN: CRP: 1.5 mg/dL — ABNORMAL HIGH (ref <1.0)

## 2020-03-08 NOTE — Progress Notes (Signed)
PHARMACY - PHYSICIAN COMMUNICATION CRITICAL VALUE ALERT - BLOOD CULTURE IDENTIFICATION (BCID)  Ryan Maxwell is an 34 y.o. male who presented to [facility] on 03/06/2020 with a chief complaint of Fever, Nausea, Emesis, Generalized Body Aches   Assessment:  Patient is COVID + and BCID result below from 1/2 bottles per Micro.   (include suspected source if known)  Name of physician (or Provider) Contacted: M. Katherina Right  Current antibiotics: none (but is on remdesivir)  Changes to prescribed antibiotics recommended:  Provider stated she thinks it is best to have rounding team full evaluate if redraw cultures, start ancef 2gm iv q8hr or both is best for the patient.  No changes at this time.  Rounding team requested to address  Results for orders placed or performed during the hospital encounter of 03/06/20  Blood Culture ID Panel (Reflexed) (Collected: 03/07/2020  3:28 AM)  Result Value Ref Range   Enterococcus species NOT DETECTED NOT DETECTED   Listeria monocytogenes NOT DETECTED NOT DETECTED   Staphylococcus species DETECTED (A) NOT DETECTED   Staphylococcus aureus (BCID) NOT DETECTED NOT DETECTED   Methicillin resistance NOT DETECTED NOT DETECTED   Streptococcus species NOT DETECTED NOT DETECTED   Streptococcus agalactiae NOT DETECTED NOT DETECTED   Streptococcus pneumoniae NOT DETECTED NOT DETECTED   Streptococcus pyogenes NOT DETECTED NOT DETECTED   Acinetobacter baumannii NOT DETECTED NOT DETECTED   Enterobacteriaceae species NOT DETECTED NOT DETECTED   Enterobacter cloacae complex NOT DETECTED NOT DETECTED   Escherichia coli NOT DETECTED NOT DETECTED   Klebsiella oxytoca NOT DETECTED NOT DETECTED   Klebsiella pneumoniae NOT DETECTED NOT DETECTED   Proteus species NOT DETECTED NOT DETECTED   Serratia marcescens NOT DETECTED NOT DETECTED   Haemophilus influenzae NOT DETECTED NOT DETECTED   Neisseria meningitidis NOT DETECTED NOT DETECTED   Pseudomonas aeruginosa NOT DETECTED  NOT DETECTED   Candida albicans NOT DETECTED NOT DETECTED   Candida glabrata NOT DETECTED NOT DETECTED   Candida krusei NOT DETECTED NOT DETECTED   Candida parapsilosis NOT DETECTED NOT DETECTED   Candida tropicalis NOT DETECTED NOT DETECTED    Aleene Davidson Crowford 03/08/2020  5:31 AM

## 2020-03-08 NOTE — Progress Notes (Addendum)
PROGRESS NOTE  Ryan Maxwell [place]  RCV:[id number] DOB: February 05, 1986 DOA: 03/06/2020 PCP: Patient, No Pcp Per  Brief Narrative: Ryan Maxwell is a 34 y.o. male with a history of bronchitis, tobacco use, and HTN not on medication who presented to the ED 5/30 with fever, chills, malaise, and subsequent shortness of breath worse on exertion that began 5/28 after being exposed to his nephew who had recently come out of isolation from covid-19 infection. He also had nausea and vomiting. In the ED he was hypoxemic requiring 2L O2 with no significant CXR findings, though elevated d-dimer inspired CTA chest which revealed no PE but R > L peripheral-predominant GGOs confirmed to be due to covid-19 pneumonia after SARS-CoV-2 testing was positive. Remdesivir and decadron were started.  Assessment & Plan: Active Problems:   COVID-19 virus infection   Acute respiratory failure with hypoxia (HCC)   Essential hypertension  Acute hypoxemic respiratory failure due to covid-19 pneumonia: SARS-CoV-2 PCR positive on 5/30, onset of illness 5/28. Inflammatory markers only modestly elevated and actually trending downward, though hypoxia has worsened and he has risk factors for severe disease and is still early in disease course. Note slight rise in ALT, decrease of lymphocyte count. - Continue remdesivir x5 days (5/30 - 6/3). Monitoring ALT.  - Continue decadron. He appears to fall into the happy hypoxic phenotype, so will need monitoring of SpO2.  - Vitamin C, zinc - Encourage OOB, IS, FV, and awake proning if able - Tylenol and antitussives prn - Isolation period recommended for 21 days from positive testing. - Check CBC w/diff, CMP, CRP daily. D-dimer stable/down, not consistent with PE as cause of worsening hypoxemia.  - Enoxaparin prophylactic dose.  - Maintain euvolemia/net negative.  - Avoid NSAIDs   History of bronchitis: Not currently wheezing.  - Steroids as above.  Staphylococcus + blood culture (1 of  4): This is most consistent with contaminant. WBC normal. - Monitor further data. Will not treat for now.  HTN: Not on medications.   Tobacco use: Quit cold [place] just before symptoms on 5/25. Does not desire nicotine patch.  - Encouraged continued cessation  Obesity: Estimated body mass index is 38.65 kg/m as calculated from the following:   Height as of 07/24/19: 6' (1.829 m).   Weight as of 07/24/19: 129.3 kg.  DVT prophylaxis: Lovenox Code Status: Full Family Communication: None at bedside Disposition Plan:  Status is: Inpatient  Remains inpatient appropriate because:IV treatments appropriate due to intensity of illness or inability to take PO and Remains newly hypoxemic from covid pneumonia and will require close monitoring for several days as he's in early course of illness.    Dispo: The patient is from: Home              Anticipated d/c is to: Home              Anticipated d/c date is: 3 days              Patient currently is not medically stable to d/c.  Consultants:   None  Procedures:   None  Antimicrobials:  Remdesivir   Subjective: Does not feel any worse today, but no better either. Oxygen needs have gone up to 5L this AM. No chest pain.  Objective: Vitals:   03/07/20 1756 03/07/20 2000 03/07/20 2027 03/08/20 0556  BP: (!) 163/111  (!) 147/103 (!) 148/103  Pulse: 83  84 70  Resp: 16 16 (!) 21 16  Temp: 98.8 F (37.1 C)  98.2 F (36.8 C) 97.8 F (36.6 C)  TempSrc: Oral  Oral Oral  SpO2: 99%  93% 98%    Intake/Output Summary (Last 24 hours) at 03/08/2020 1315 Last data filed at 03/08/2020 1229 Gross per 24 hour  Intake 758.34 ml  Output 900 ml  Net -141.66 ml   Gen: 34 y.o. male in no distress Pulm: Nonlabored but tachypneic, no accessory muscle use. Distant without crackles or wheezes. CV: Regular rate and rhythm. No murmur, rub, or gallop. No JVD, no dependent edema. GI: Abdomen soft, non-tender, non-distended, with normoactive bowel  sounds.  Ext: Warm, no deformities Skin: No rashes, lesions or ulcers on visualized skin. Neuro: Alert and oriented. No focal neurological deficits. Psych: Judgement and insight appear fair. Mood euthymic & affect congruent. Behavior is appropriate.    Data Reviewed: I have personally reviewed following labs and imaging studies  CBC: Recent Labs  Lab 03/06/20 1633 03/07/20 0328 03/08/20 0320  WBC 6.4 7.2 6.3  NEUTROABS 3.4 4.4 4.2  HGB 13.8 13.9 14.8  HCT 47.5 47.1 49.2  MCV 94.1 92.5 90.6  PLT 186 188 215   Basic Metabolic Panel: Recent Labs  Lab 03/06/20 1633 03/07/20 0328 03/08/20 0320  NA 141 143 141  K 3.6 3.8 4.5  CL 104 105 102  CO2 27 29 30   GLUCOSE 118* 100* 138*  BUN 10 11 12   CREATININE 0.91 0.96 0.75  CALCIUM 8.0* 8.1* 8.7*  MG  --  2.4  --    GFR: CrCl cannot be calculated (Unknown ideal weight.). Liver Function Tests: Recent Labs  Lab 03/06/20 1633 03/07/20 0328 03/08/20 0320  AST 41 40 41  ALT 49* 49* 57*  ALKPHOS 50 48 52  BILITOT 0.5 0.7 0.4  PROT 7.5 7.3 8.1  ALBUMIN 3.7 3.6 3.7   No results for input(s): LIPASE, AMYLASE in the last 168 hours. No results for input(s): AMMONIA in the last 168 hours. Coagulation Profile: No results for input(s): INR, PROTIME in the last 168 hours. Cardiac Enzymes: No results for input(s): CKTOTAL, CKMB, CKMBINDEX, TROPONINI in the last 168 hours. BNP (last 3 results) No results for input(s): PROBNP in the last 8760 hours. HbA1C: No results for input(s): HGBA1C in the last 72 hours. CBG: No results for input(s): GLUCAP in the last 168 hours. Lipid Profile: Recent Labs    03/07/20 0354  CHOL 126  HDL 25*  LDLCALC 86  TRIG 77  CHOLHDL 5.0   Thyroid Function Tests: No results for input(s): TSH, T4TOTAL, FREET4, T3FREE, THYROIDAB in the last 72 hours. Anemia Panel: Recent Labs    03/07/20 0328  FERRITIN 205   Urine analysis:    Component Value Date/Time   COLORURINE YELLOW 03/06/2020 1633    APPEARANCEUR TURBID (A) 03/06/2020 1633   LABSPEC 1.025 03/06/2020 1633   PHURINE 5.0 03/06/2020 1633   GLUCOSEU NEGATIVE 03/06/2020 1633   HGBUR NEGATIVE 03/06/2020 1633   BILIRUBINUR NEGATIVE 03/06/2020 1633   KETONESUR NEGATIVE 03/06/2020 1633   PROTEINUR 30 (A) 03/06/2020 1633   NITRITE NEGATIVE 03/06/2020 1633   LEUKOCYTESUR NEGATIVE 03/06/2020 1633   Recent Results (from the past 240 hour(s))  SARS CORONAVIRUS 2 (TAT 6-24 HRS) Nasopharyngeal Nasopharyngeal Swab     Status: Abnormal   Collection Time: 03/06/20  5:00 PM   Specimen: Nasopharyngeal Swab  Result Value Ref Range Status   SARS Coronavirus 2 POSITIVE (A) NEGATIVE Final    Comment: RESULT CALLED TO, READ BACK BY AND VERIFIED WITH: S.BLAINE RN 1805 03/07/20  MCCORMICK K (NOTE) SARS-CoV-2 target nucleic acids are DETECTED. The SARS-CoV-2 RNA is generally detectable in upper and lower respiratory specimens during the acute phase of infection. Positive results are indicative of the presence of SARS-CoV-2 RNA. Clinical correlation with patient history and other diagnostic information is  necessary to determine patient infection status. Positive results do not rule out bacterial infection or co-infection with other viruses.  The expected result is Negative. Fact Sheet for Patients: HairSlick.no Fact Sheet for Healthcare Providers: quierodirigir.com This test is not yet approved or cleared by the [place] FDA and  has been authorized for detection and/or diagnosis of SARS-CoV-2 by FDA under an Emergency Use Authorization (EUA). This EUA will remain  in effect (meaning this test can be used) for t he duration of the COVID-19 declaration under Section 564(b)(1) of the Act, 21 U.S.C. section 360bbb-3(b)(1), unless the authorization is terminated or revoked sooner. Performed at [facility], [address], [place], [place] [address]   SARS Coronavirus 2 by  RT PCR (hospital order, performed in [facility] hospital lab) Nasopharyngeal Nasopharyngeal Swab     Status: Abnormal   Collection Time: 03/06/20  5:45 PM   Specimen: Nasopharyngeal Swab  Result Value Ref Range Status   SARS Coronavirus 2 POSITIVE (A) NEGATIVE Final    Comment: RESULT CALLED TO, READ BACK BY AND VERIFIED WITH: J.Princella Ion [id number] @2232  BY V.WILKINS (NOTE) SARS-CoV-2 target nucleic acids are DETECTED SARS-CoV-2 RNA is generally detectable in upper respiratory specimens  during the acute phase of infection.  Positive results are indicative  of the presence of the identified virus, but do not rule out bacterial infection or co-infection with other pathogens not detected by the test.  Clinical correlation with patient history and  other diagnostic information is necessary to determine patient infection status.  The expected result is negative. Fact Sheet for Patients:    Fact Sheet for Healthcare Providers:   BoilerBrush.com.cy   This test is not yet approved or cleared by the https://pope.com/ FDA and  has been authorized for detection and/or diagnosis of SARS-CoV-2 by FDA under an Emergency Use Authorization (EUA).  This EUA will remain in effect (meaning this tes t can be used) for the duration of  the COVID-19 declaration under Section 564(b)(1) of the Act, 21 U.S.C. section 360-bbb-3(b)(1), unless the authorization is terminated or revoked sooner. Performed at [facility] [place] [facility], [address], [place], [place]   Culture, blood (Routine X 2) w Reflex to ID Panel     Status: None (Preliminary result)   Collection Time: 03/07/20  3:28 AM   Specimen: BLOOD RIGHT FOREARM  Result Value Ref Range Status   Specimen Description   Final    BLOOD RIGHT FOREARM Performed at [facility], [address] [place], [place]    Special Requests   Final     BOTTLES DRAWN AEROBIC ONLY Blood Culture adequate volume Performed at [facility], [address] [place], [place]    Culture  Setup Time   Final    AEROBIC BOTTLE ONLY GRAM POSITIVE COCCI IN PAIRS IN TETRADS Organism ID to follow CRITICAL RESULT CALLED TO, READ BACK BY AND VERIFIED WITH: PHARMD GRIMSLEY JESSE AT 0505 BY MESSAN HOUEGNIFIO ON 6 1 2021    Culture   Final    NO GROWTH 1 DAY Performed at [facility], [address], [place], [place]    Report Status  PENDING  Incomplete  Culture, blood (Routine X 2) w Reflex to ID Panel     Status: None (Preliminary result)   Collection Time: 03/07/20  3:28 AM   Specimen: BLOOD  Result Value Ref Range Status   Specimen Description   Final    BLOOD RIGHT ANTECUBITAL Performed at [facility] [address], [place], [place] [address]    Special Requests   Final    BOTTLES DRAWN AEROBIC ONLY Blood Culture results may not be optimal due to an inadequate volume of blood received in culture bottles Performed at [facility] [address], [place], [place] [address]    Culture   Final    NO GROWTH 1 DAY Performed at [facility], [place] [address] [place], [place] [address]    Report Status PENDING  Incomplete  Blood Culture ID Panel (Reflexed)     Status: Abnormal   Collection Time: 03/07/20  3:28 AM  Result Value Ref Range Status   Enterococcus species NOT DETECTED NOT DETECTED Final   Listeria monocytogenes NOT DETECTED NOT DETECTED Final   Staphylococcus species DETECTED (A) NOT DETECTED Final    Comment: Methicillin (oxacillin) susceptible coagulase negative staphylococcus. Possible blood culture contaminant (unless isolated from more than one blood culture draw or clinical case suggests pathogenicity). No antibiotic treatment is indicated for blood  culture contaminants. CRITICAL RESULT CALLED TO, READ BACK BY AND VERIFIED WITH: PHARMD  [place] ON 6 1 2021    Staphylococcus aureus (BCID) NOT DETECTED NOT DETECTED Final   Methicillin resistance NOT DETECTED NOT DETECTED Final   Streptococcus species NOT DETECTED NOT DETECTED Final   Streptococcus agalactiae NOT DETECTED NOT DETECTED Final   Streptococcus pneumoniae NOT DETECTED NOT DETECTED Final   Streptococcus pyogenes NOT DETECTED NOT DETECTED Final   Acinetobacter baumannii NOT DETECTED NOT DETECTED Final   Enterobacteriaceae species NOT DETECTED NOT DETECTED Final   Enterobacter cloacae complex NOT DETECTED NOT DETECTED Final   Escherichia coli NOT DETECTED NOT DETECTED Final   Klebsiella oxytoca NOT DETECTED NOT DETECTED Final   Klebsiella pneumoniae NOT DETECTED NOT DETECTED Final   Proteus species NOT DETECTED NOT DETECTED Final   Serratia marcescens NOT DETECTED NOT DETECTED Final   Haemophilus influenzae NOT DETECTED NOT DETECTED Final   Neisseria meningitidis NOT DETECTED NOT DETECTED Final   Pseudomonas aeruginosa NOT DETECTED NOT DETECTED Final   Candida albicans NOT DETECTED NOT DETECTED Final   Candida glabrata NOT DETECTED NOT DETECTED Final   Candida krusei NOT DETECTED NOT DETECTED Final   Candida parapsilosis NOT DETECTED NOT DETECTED Final   Candida tropicalis NOT DETECTED NOT DETECTED Final    Comment: Performed at [facility], [place] [address], [place], [place] [address]      Radiology Studies: CT Angio Chest PE W and/or Wo Contrast  Result Date: 03/07/2020 CLINICAL DATA:  Shortness of breath COVID positive EXAM: CT ANGIOGRAPHY CHEST WITH CONTRAST TECHNIQUE: Multidetector CT imaging of the chest was performed using the standard protocol during bolus administration of intravenous contrast. Multiplanar CT image reconstructions and MIPs were obtained to evaluate the vascular anatomy. CONTRAST:  133mL OMNIPAQUE IOHEXOL 350 MG/ML SOLN COMPARISON:  Mar 06, 2020 FINDINGS: Cardiovascular: There is slightly  suboptimal opacification of the main pulmonary artery. No central or proximal segmental pulmonary embolism is seen. The heart is normal in size. No pericardial effusion or thickening. No evidence right heart strain. There is normal three-vessel brachiocephalic  anatomy without proximal stenosis. The thoracic aorta is normal in appearance. Mediastinum/Nodes: Scattered bilateral hilar lymph nodes are seen the largest in the right hilum measuring 1.8 cm in transverse dimension, series 4, image 59. Thyroid gland, trachea, and esophagus demonstrate no significant findings. Lungs/Pleura: Mild peripherally based ground-glass opacities are seen throughout the periphery of the right lower lung and bilateral lung bases, right greater than left. No pleural effusion or pneumothorax. Upper Abdomen: No acute abnormalities present in the visualized portions of the upper abdomen. Musculoskeletal: No chest wall abnormality. No acute or significant osseous findings. Review of the MIP images confirms the above findings. IMPRESSION: Slightly suboptimal opacification of the main pulmonary artery. No central or proximal segmental pulmonary embolism. Patchy ground-glass opacities, right greater than left, consistent with multifocal pneumonia Right hilar adenopathy.  The Electronically Signed   By: Jonna Clark M.D.   On: 03/07/2020 01:23   DG Chest Port 1 View  Result Date: 03/06/2020 CLINICAL DATA:  Fever and chills.  Body aches. EXAM: PORTABLE CHEST 1 VIEW COMPARISON:  November 03, 2018 FINDINGS: The heart size and mediastinal contours are within normal limits. Both lungs are clear. The visualized skeletal structures are unremarkable. IMPRESSION: No active disease. Electronically Signed   By: Gerome Sam III M.D   On: 03/06/2020 17:46    Scheduled Meds: . vitamin C  500 mg Oral Daily  . dexamethasone (DECADRON) injection  6 mg Intravenous Q24H  . enoxaparin (LOVENOX) injection  40 mg Subcutaneous Q24H  . sodium chloride  flush  3 mL Intravenous Once  . sodium chloride flush  3 mL Intravenous Q12H  . sodium chloride flush  3 mL Intravenous Q12H  . zinc sulfate  220 mg Oral Daily   Continuous Infusions: . sodium chloride    . remdesivir 100 mg in NS 100 mL 100 mg (03/08/20 0923)     LOS: 2 days   Time spent: 25 minutes.  Tyrone Nine, MD [facility] [email or web address] 03/08/2020, 1:15 PM

## 2020-03-08 NOTE — Progress Notes (Signed)
Pt with periods of apnea and snoring loudly through PM while sleeping. Saturations will drop to low 80's and then up to 96-98% and in no acute distress when woken up.

## 2020-03-08 NOTE — Plan of Care (Signed)
  Problem: Education: Goal: Knowledge of General Education information will improve Description: Including pain rating scale, medication(s)/side effects and non-pharmacologic comfort measures Outcome: Progressing   Problem: Activity: Goal: Risk for activity intolerance will decrease Outcome: Progressing   Problem: Nutrition: Goal: Adequate nutrition will be maintained Outcome: Progressing   Problem: Elimination: Goal: Will not experience complications related to bowel motility Outcome: Progressing Goal: Will not experience complications related to urinary retention Outcome: Progressing   Problem: Pain Managment: Goal: General experience of comfort will improve Outcome: Progressing   Problem: Safety: Goal: Ability to remain free from injury will improve Outcome: Progressing   Problem: Skin Integrity: Goal: Risk for impaired skin integrity will decrease Outcome: Progressing   Problem: Education: Goal: Knowledge of risk factors and measures for prevention of condition will improve Outcome: Progressing   Problem: Coping: Goal: Psychosocial and spiritual needs will be supported Outcome: Progressing   Problem: Respiratory: Goal: Will maintain a patent airway Outcome: Progressing Goal: Complications related to the disease process, condition or treatment will be avoided or minimized Outcome: Progressing   

## 2020-03-09 LAB — COMPREHENSIVE METABOLIC PANEL
ALT: 56 U/L — ABNORMAL HIGH (ref 0–44)
AST: 31 U/L (ref 15–41)
Albumin: 3.5 g/dL (ref 3.5–5.0)
Alkaline Phosphatase: 50 U/L (ref 38–126)
Anion gap: 9 (ref 5–15)
BUN: 15 mg/dL (ref 6–20)
CO2: 28 mmol/L (ref 22–32)
Calcium: 8.5 mg/dL — ABNORMAL LOW (ref 8.9–10.3)
Chloride: 104 mmol/L (ref 98–111)
Creatinine, Ser: 0.63 mg/dL (ref 0.61–1.24)
GFR calc Af Amer: >60 mL/min (ref >60)
GFR calc non Af Amer: >60 mL/min (ref >60)
Glucose, Bld: 142 mg/dL — ABNORMAL HIGH (ref 70–99)
Potassium: 4.4 mmol/L (ref 3.5–5.1)
Sodium: 141 mmol/L (ref 135–145)
Total Bilirubin: 0.5 mg/dL (ref 0.3–1.2)
Total Protein: 7.2 g/dL (ref 6.5–8.1)

## 2020-03-09 LAB — CBC WITH DIFFERENTIAL/PLATELET
Abs Immature Granulocytes: 0.08 10*3/uL — ABNORMAL HIGH (ref 0.00–0.07)
Basophils Absolute: 0 10*3/uL (ref 0.0–0.1)
Basophils Relative: 0 %
Eosinophils Absolute: 0 10*3/uL (ref 0.0–0.5)
Eosinophils Relative: 0 %
HCT: 49.7 % (ref 39.0–52.0)
Hemoglobin: 14.8 g/dL (ref 13.0–17.0)
Immature Granulocytes: 1 %
Lymphocytes Relative: 16 %
Lymphs Abs: 1.4 10*3/uL (ref 0.7–4.0)
MCH: 27.2 pg (ref 26.0–34.0)
MCHC: 29.8 g/dL — ABNORMAL LOW (ref 30.0–36.0)
MCV: 91.4 fL (ref 80.0–100.0)
Monocytes Absolute: 0.7 10*3/uL (ref 0.1–1.0)
Monocytes Relative: 9 %
Neutro Abs: 6.5 10*3/uL (ref 1.7–7.7)
Neutrophils Relative %: 74 %
Platelets: 277 10*3/uL (ref 150–400)
RBC: 5.44 MIL/uL (ref 4.22–5.81)
RDW: 12.6 % (ref 11.5–15.5)
WBC: 8.7 10*3/uL (ref 4.0–10.5)
nRBC: 0 % (ref 0.0–0.2)

## 2020-03-09 LAB — C-REACTIVE PROTEIN: CRP: 0.7 mg/dL (ref <1.0)

## 2020-03-09 LAB — D-DIMER, QUANTITATIVE: D-Dimer, Quant: 1.15 ug/mL-FEU — ABNORMAL HIGH (ref 0.00–0.50)

## 2020-03-09 MED ORDER — DEXAMETHASONE SODIUM PHOSPHATE 10 MG/ML IJ SOLN
6.0000 mg | Freq: Two times a day (BID) | INTRAMUSCULAR | Status: DC
  Administered 2020-03-09 (×2): 6 mg via INTRAVENOUS
  Filled 2020-03-09 (×2): qty 1

## 2020-03-09 MED ORDER — IPRATROPIUM BROMIDE HFA 17 MCG/ACT IN AERS
2.0000 | INHALATION_SPRAY | RESPIRATORY_TRACT | Status: DC
  Administered 2020-03-09 – 2020-03-11 (×12): 2 via RESPIRATORY_TRACT
  Filled 2020-03-09: qty 12.9

## 2020-03-09 MED ORDER — ALBUTEROL SULFATE HFA 108 (90 BASE) MCG/ACT IN AERS
2.0000 | INHALATION_SPRAY | RESPIRATORY_TRACT | Status: DC
  Administered 2020-03-09 – 2020-03-11 (×12): 2 via RESPIRATORY_TRACT
  Filled 2020-03-09: qty 6.7

## 2020-03-09 NOTE — Progress Notes (Signed)
PROGRESS NOTE  Ryan Maxwell  SWF:[id number] DOB: 02-04-1986 DOA: 03/06/2020 PCP: Patient, No Pcp Per  Brief Narrative: Ryan Maxwell is a 34 y.o. male with a history of bronchitis, tobacco use, and HTN not on medication who presented to the ED 5/30 with fever, chills, malaise, and subsequent shortness of breath worse on exertion that began 5/28 after being exposed to his nephew who had recently come out of isolation from covid-19 infection. He also had nausea and vomiting. In the ED he was hypoxemic requiring 2L O2 with no significant CXR findings, though elevated d-dimer inspired CTA chest which revealed no PE but R > L peripheral-predominant GGOs confirmed to be due to covid-19 pneumonia after SARS-CoV-2 testing was positive. Remdesivir and decadron were started.   Subjective: The patient was seen and examined, continue to complain of shortness of breath and persistent cough excessively with exertion.  Patient has been weaned off oxygen, currently on 3 L of oxygen, satting 90%     Assessment & Plan: Active Problems:   COVID-19 virus infection   Acute respiratory failure with hypoxia (HCC)   Essential hypertension  Acute hypoxemic respiratory failure  -due to covid-19 pneumonia: SARS-CoV-2 PCR positive on 5/30, onset of illness 5/28. -Worsening hypoxia -Continue to be respite distress, now requiring 3 L of oxygen, satting 90% -Improving inflammatory markers including CRP down to 2.0 1.5 >> 0.7   - Continue remdesivir x5 days (5/30 - 6/3). Monitoring LFTs : AST, alk phos within normal limits, ALT 49, 57, 56 today  - Continue decadron (increasing to 6 mg twice daily) he appears to fall into the happy hypoxic phenotype, so will need monitoring of SpO2.  - Vitamin C, zinc - Encourage OOB, IS, FV, and awake proning if able - Tylenol and antitussives prn - Isolation period recommended for 21 days from positive testing. - Check CBC w/diff, CMP, CRP daily. D-dimer stable/down, not  consistent with PE as cause of worsening hypoxemia.  - Enoxaparin prophylactic dose.  - Maintain euvolemia/net negative.  - Avoid NSAIDs   History of bronchitis:  -Remained stable, minimal rhonchi no wheezing - Steroids as above.  Staphylococcus + blood culture (1 of 4):  This is most consistent with contaminant. WBC normal. -Repeat blood cultures- - Monitor further data. Will not treat for now.  HTN: Not on medications.   Tobacco use: Quit cold [place] just before symptoms on 5/25. Does not desire nicotine patch.  - Encouraged continued cessation  Obesity: Estimated body mass index is 38.65 kg/m as calculated from the following:   Height as of 07/24/19: 6' (1.829 m).   Weight as of 07/24/19: 129.3 kg.  DVT prophylaxis: Lovenox Code Status: Full Family Communication: None at bedside Disposition Plan:  Status is: Inpatient  Remains inpatient appropriate because:IV treatments appropriate due to intensity of illness or inability to take PO and Remains newly hypoxemic from covid pneumonia and will require close monitoring for several days as he's in early course of illness.    Dispo: The patient is from: Home              Anticipated d/c is to: Home              Anticipated d/c date is: 3 days              Patient currently is not medically stable to d/c.  Consultants:   None  Procedures:   None  Antimicrobials:  Remdesivir   Objective: Vitals:   03/08/20 1423 03/08/20  2228 03/09/20 0627 03/09/20 1004  BP: (!) 143/92 (!) 138/93 137/82   Pulse: 75 75 74   Resp: 20 (!) 22 (!) 22   Temp: 98.1 F (36.7 C) 98.7 F (37.1 C) 98.3 F (36.8 C)   TempSrc: Oral Oral Oral   SpO2: 92% 97% 96% 90%    Intake/Output Summary (Last 24 hours) at 03/09/2020 1347 Last data filed at 03/08/2020 2337 Gross per 24 hour  Intake 435.8 ml  Output --  Net 435.8 ml    Physical Exam  Constitution: In mild-respite distress with persistent cough shortness of breath  Otherwise alert,  cooperative, no distress,  Psychiatric: Normal and stable mood and affect, cognition intact,   HEENT: Normocephalic, PERRL, otherwise with in Normal limits  Chest:Chest symmetric Cardio vascular:  S1/S2, RRR, No murmure, No Rubs or Gallops  pulmonary: Diffuse minimal rhonchi, positive air entry throughout the lung fields, negative wheezes / crackles Abdomen: Soft, non-tender, non-distended, bowel sounds,no masses, no organomegaly Muscular skeletal: Limited exam - in bed, able to move all 4 extremities, Normal strength,  Neuro: CNII-XII intact. , normal motor and sensation, reflexes intact  Extremities: No pitting edema lower extremities, +2 pulses  Skin: Dry, warm to touch, negative for any Rashes, No open wounds Wounds: per nursing documentation      Data Reviewed: I have personally reviewed following labs and imaging studies  CBC: Recent Labs  Lab 03/06/20 1633 03/07/20 0328 03/08/20 0320 03/09/20 0426  WBC 6.4 7.2 6.3 8.7  NEUTROABS 3.4 4.4 4.2 6.5  HGB 13.8 13.9 14.8 14.8  HCT 47.5 47.1 49.2 49.7  MCV 94.1 92.5 90.6 91.4  PLT 186 188 215 657   Basic Metabolic Panel: Recent Labs  Lab 03/06/20 1633 03/07/20 0328 03/08/20 0320 03/09/20 0426  NA 141 143 141 141  K 3.6 3.8 4.5 4.4  CL 104 105 102 104  CO2 '27 29 30 28  ' GLUCOSE 118* 100* 138* 142*  BUN '10 11 12 15  ' CREATININE 0.91 0.96 0.75 0.63  CALCIUM 8.0* 8.1* 8.7* 8.5*  MG  --  2.4  --   --    GFR: CrCl cannot be calculated (Unknown ideal weight.). Liver Function Tests: Recent Labs  Lab 03/06/20 1633 03/07/20 0328 03/08/20 0320 03/09/20 0426  AST 41 40 41 31  ALT 49* 49* 57* 56*  ALKPHOS 50 48 52 50  BILITOT 0.5 0.7 0.4 0.5  PROT 7.5 7.3 8.1 7.2  ALBUMIN 3.7 3.6 3.7 3.5  Lipid Profile: Recent Labs    03/07/20 0354  CHOL 126  HDL 25*  LDLCALC 86  TRIG 77  CHOLHDL 5.0   Thyroid Function Tests: No results for input(s): TSH, T4TOTAL, FREET4, T3FREE, THYROIDAB in the last 72 hours. Anemia  Panel: Recent Labs    03/07/20 0328  FERRITIN 205   Urine analysis:    Component Value Date/Time   COLORURINE YELLOW 03/06/2020 1633   APPEARANCEUR TURBID (A) 03/06/2020 1633   LABSPEC 1.025 03/06/2020 1633   PHURINE 5.0 03/06/2020 1633   GLUCOSEU NEGATIVE 03/06/2020 1633   [place] 03/06/2020 1633   [place] 03/06/2020 1633   [place] 03/06/2020 1633   PROTEINUR 30 (A) 03/06/2020 1633   NITRITE NEGATIVE 03/06/2020 1633   LEUKOCYTESUR NEGATIVE 03/06/2020 1633   Recent Results (from the past 240 hour(s))  SARS CORONAVIRUS 2 (TAT 6-24 HRS) Nasopharyngeal Nasopharyngeal Swab     Status: Abnormal   Collection Time: 03/06/20  5:00 PM   Specimen: Nasopharyngeal Swab  Result Value  Ref Range Status   SARS Coronavirus 2 POSITIVE (A) NEGATIVE Final    Comment: RESULT CALLED TO, READ BACK BY AND VERIFIED WITH: S.BLAINE RN 1805 03/07/20 MCCORMICK K (NOTE) SARS-CoV-2 target nucleic acids are DETECTED. The SARS-CoV-2 RNA is generally detectable in upper and lower respiratory specimens during the acute phase of infection. Positive results are indicative of the presence of SARS-CoV-2 RNA. Clinical correlation with patient history and other diagnostic information is  necessary to determine patient infection status. Positive results do not rule out bacterial infection or co-infection with other viruses.  The expected result is Negative. Fact Sheet for Patients: SugarRoll.be Fact Sheet for Healthcare Providers: https://www.woods-mathews.com/ This test is not yet approved or cleared by the [place] FDA and  has been authorized for detection and/or diagnosis of SARS-CoV-2 by FDA under an Emergency Use Authorization (EUA). This EUA will remain  in effect (meaning this test can be used) for t he duration of the COVID-19 declaration under Section 564(b)(1) of the Act, 21 U.S.C. section 360bbb-3(b)(1), unless the  authorization is terminated or revoked sooner. Performed at [facility], [place] [address] [place], [place] [address]   SARS Coronavirus 2 by RT PCR (hospital order, performed in [facility] hospital lab) Nasopharyngeal Nasopharyngeal Swab     Status: Abnormal   Collection Time: 03/06/20  5:45 PM   Specimen: Nasopharyngeal Swab  Result Value Ref Range Status   SARS Coronavirus 2 POSITIVE (A) NEGATIVE Final    Comment: RESULT CALLED TO, READ BACK BY AND VERIFIED WITH: J.Everlean Patterson [id number] '@2232'  BY V.WILKINS (NOTE) SARS-CoV-2 target nucleic acids are DETECTED SARS-CoV-2 RNA is generally detectable in upper respiratory specimens  during the acute phase of infection.  Positive results are indicative  of the presence of the identified virus, but do not rule out bacterial infection or co-infection with other pathogens not detected by the test.  Clinical correlation with patient history and  other diagnostic information is necessary to determine patient infection status.  The expected result is negative. Fact Sheet for Patients:   StrictlyIdeas.no  Fact Sheet for Healthcare Providers:   BankingDealers.co.za   This test is not yet approved or cleared by the [place] FDA and  has been authorized for detection and/or diagnosis of SARS-CoV-2 by FDA under an Emergency Use Authorization (EUA).  This EUA will remain in effect (meaning this tes t can be used) for the duration of  the COVID-19 declaration under Section 564(b)(1) of the Act, 21 U.S.C. section 360-bbb-3(b)(1), unless the authorization is terminated or revoked sooner. Performed at [facility], [place] [address], [place], [place] [address]   Culture, blood (Routine X 2) w Reflex to ID Panel     Status: Abnormal (Preliminary result)   Collection Time: 03/07/20  3:28 AM   Specimen: BLOOD RIGHT FOREARM  Result Value Ref Range Status   Specimen Description    Final    BLOOD RIGHT FOREARM Performed at [facility] [address], [place], [place] [address]    Special Requests   Final    BOTTLES DRAWN AEROBIC ONLY Blood Culture adequate volume Performed at [facility] [address] [place], [place] [address]    Culture  Setup Time   Final    AEROBIC BOTTLE ONLY GRAM POSITIVE COCCI IN PAIRS IN TETRADS CRITICAL RESULT CALLED TO, READ BACK BY AND VERIFIED WITH: PHARMD GRIMSLEY JESSE AT [place] ON 6 1 2021    Culture (A)  Final  STAPHYLOCOCCUS SPECIES (COAGULASE NEGATIVE) THE SIGNIFICANCE OF ISOLATING THIS ORGANISM FROM A SINGLE SET OF BLOOD CULTURES WHEN MULTIPLE SETS ARE DRAWN IS UNCERTAIN. PLEASE NOTIFY THE MICROBIOLOGY DEPARTMENT WITHIN ONE WEEK IF SPECIATION AND SENSITIVITIES ARE REQUIRED. Performed at [facility], [place] [address] [place], [place] [address]    Report Status PENDING  Incomplete  Culture, blood (Routine X 2) w Reflex to ID Panel     Status: None (Preliminary result)   Collection Time: 03/07/20  3:28 AM   Specimen: BLOOD  Result Value Ref Range Status   Specimen Description   Final    BLOOD RIGHT ANTECUBITAL Performed at [facility] [address] [place], [place] [address]    Special Requests   Final    BOTTLES DRAWN AEROBIC ONLY Blood Culture results may not be optimal due to an inadequate volume of blood received in culture bottles Performed at [facility] [address] [place], [place] [address]    Culture   Final    NO GROWTH 2 DAYS Performed at [facility] [address] [place], [place] [address]    Report Status PENDING  Incomplete  Blood Culture ID Panel (Reflexed)     Status: Abnormal   Collection Time: 03/07/20  3:28 AM  Result Value Ref Range Status   Enterococcus species NOT DETECTED NOT DETECTED Final   Listeria monocytogenes NOT DETECTED NOT DETECTED Final   Staphylococcus species  DETECTED (A) NOT DETECTED Final    Comment: Methicillin (oxacillin) susceptible coagulase negative staphylococcus. Possible blood culture contaminant (unless isolated from more than one blood culture draw or clinical case suggests pathogenicity). No antibiotic treatment is indicated for blood  culture contaminants. CRITICAL RESULT CALLED TO, READ BACK BY AND VERIFIED WITH: PHARMD [place] ON 6 1 2021    Staphylococcus aureus (BCID) NOT DETECTED NOT DETECTED Final   Methicillin resistance NOT DETECTED NOT DETECTED Final   Streptococcus species NOT DETECTED NOT DETECTED Final   Streptococcus agalactiae NOT DETECTED NOT DETECTED Final   Streptococcus pneumoniae NOT DETECTED NOT DETECTED Final   Streptococcus pyogenes NOT DETECTED NOT DETECTED Final   Acinetobacter baumannii NOT DETECTED NOT DETECTED Final   Enterobacteriaceae species NOT DETECTED NOT DETECTED Final   Enterobacter cloacae complex NOT DETECTED NOT DETECTED Final   Escherichia coli NOT DETECTED NOT DETECTED Final   Klebsiella oxytoca NOT DETECTED NOT DETECTED Final   Klebsiella pneumoniae NOT DETECTED NOT DETECTED Final   Proteus species NOT DETECTED NOT DETECTED Final   Serratia marcescens NOT DETECTED NOT DETECTED Final   Haemophilus influenzae NOT DETECTED NOT DETECTED Final   Neisseria meningitidis NOT DETECTED NOT DETECTED Final   Pseudomonas aeruginosa NOT DETECTED NOT DETECTED Final   Candida albicans NOT DETECTED NOT DETECTED Final   Candida glabrata NOT DETECTED NOT DETECTED Final   Candida krusei NOT DETECTED NOT DETECTED Final   Candida parapsilosis NOT DETECTED NOT DETECTED Final   Candida tropicalis NOT DETECTED NOT DETECTED Final    Comment: Performed at [facility], [place] [address], [place], [place] [address]      Radiology Studies: No results found.  Scheduled Meds: . vitamin C  500 mg Oral Daily  . dexamethasone (DECADRON) injection  6 mg Intravenous Q24H  .  enoxaparin (LOVENOX) injection  40 mg Subcutaneous Q24H  . sodium chloride flush  3 mL Intravenous Once  . sodium chloride flush  3 mL Intravenous Q12H  . sodium  chloride flush  3 mL Intravenous Q12H  . zinc sulfate  220 mg Oral Daily   Continuous Infusions: . sodium chloride    . remdesivir 100 mg in NS 100 mL 100 mg (03/09/20 0953)     LOS: 3 days   Time spent: 25 minutes.  Deatra James, MD [facility] [email or web address] 03/09/2020, 1:47 PM

## 2020-03-09 NOTE — Progress Notes (Signed)
Pt O2 noted to be down as low as 30.  Pt asleep when RN opens the door pt wakes up and O2 slowly comes back up

## 2020-03-09 NOTE — TOC Progression Note (Signed)
Transition of Care [facility]) - Progression Note    Patient Details  Name: COLIN ELLERS MRN: [id number] Date of Birth: 1986/06/14  [facility] CM/SW Contact  Geni Bers, RN Phone Number: 03/09/2020, 2:35 PM  Clinical Narrative:     TOC will continue to follow for discharge needs.  Expected Discharge Plan: Home/Self Care Barriers to Discharge: No Barriers Identified  Expected Discharge Plan and Services Expected Discharge Plan: Home/Self Care   Discharge Planning Services: CM Consult   Living arrangements for the past 2 months: Single Family Home                                       Social Determinants of Health (SDOH) Interventions    Readmission Risk Interventions No flowsheet data found.

## 2020-03-09 NOTE — Progress Notes (Signed)
Patient remains on 5L [place] today, saturations fluctuate between 92-97% depending on activity level. Using Incentive spirometer.

## 2020-03-09 NOTE — Plan of Care (Signed)
  Problem: Education: Goal: Knowledge of General Education information will improve Description: Including pain rating scale, medication(s)/side effects and non-pharmacologic comfort measures Outcome: Progressing   Problem: Activity: Goal: Risk for activity intolerance will decrease Outcome: Progressing   Problem: Nutrition: Goal: Adequate nutrition will be maintained Outcome: Progressing   Problem: Elimination: Goal: Will not experience complications related to bowel motility Outcome: Progressing Goal: Will not experience complications related to urinary retention Outcome: Progressing   Problem: Pain Managment: Goal: General experience of comfort will improve Outcome: Progressing   Problem: Safety: Goal: Ability to remain free from injury will improve Outcome: Progressing   Problem: Skin Integrity: Goal: Risk for impaired skin integrity will decrease Outcome: Progressing   Problem: Education: Goal: Knowledge of risk factors and measures for prevention of condition will improve Outcome: Progressing   Problem: Coping: Goal: Psychosocial and spiritual needs will be supported Outcome: Progressing   Problem: Respiratory: Goal: Will maintain a patent airway Outcome: Progressing Pt continues to need O2 especially when sleeping, Pt O2 sats have gotten into the 30's during the night, immediately rebounds when awaken Goal: Complications related to the disease process, condition or treatment will be avoided or minimized Outcome: Progressing

## 2020-03-10 LAB — CULTURE, BLOOD (ROUTINE X 2): Special Requests: ADEQUATE

## 2020-03-10 LAB — CBC WITH DIFFERENTIAL/PLATELET
Abs Immature Granulocytes: 0.14 10*3/uL — ABNORMAL HIGH (ref 0.00–0.07)
Basophils Absolute: 0.1 10*3/uL (ref 0.0–0.1)
Basophils Relative: 1 %
Eosinophils Absolute: 0 10*3/uL (ref 0.0–0.5)
Eosinophils Relative: 0 %
HCT: 49 % (ref 39.0–52.0)
Hemoglobin: 14.7 g/dL (ref 13.0–17.0)
Immature Granulocytes: 1 %
Lymphocytes Relative: 12 %
Lymphs Abs: 1.4 10*3/uL (ref 0.7–4.0)
MCH: 27.3 pg (ref 26.0–34.0)
MCHC: 30 g/dL (ref 30.0–36.0)
MCV: 90.9 fL (ref 80.0–100.0)
Monocytes Absolute: 1.1 10*3/uL — ABNORMAL HIGH (ref 0.1–1.0)
Monocytes Relative: 10 %
Neutro Abs: 8.4 10*3/uL — ABNORMAL HIGH (ref 1.7–7.7)
Neutrophils Relative %: 76 %
Platelets: 248 10*3/uL (ref 150–400)
RBC: 5.39 MIL/uL (ref 4.22–5.81)
RDW: 12.4 % (ref 11.5–15.5)
WBC: 11 10*3/uL — ABNORMAL HIGH (ref 4.0–10.5)
nRBC: 0 % (ref 0.0–0.2)

## 2020-03-10 LAB — COMPREHENSIVE METABOLIC PANEL
ALT: 53 U/L — ABNORMAL HIGH (ref 0–44)
AST: 24 U/L (ref 15–41)
Albumin: 3.5 g/dL (ref 3.5–5.0)
Alkaline Phosphatase: 47 U/L (ref 38–126)
Anion gap: 10 (ref 5–15)
BUN: 17 mg/dL (ref 6–20)
CO2: 28 mmol/L (ref 22–32)
Calcium: 8.7 mg/dL — ABNORMAL LOW (ref 8.9–10.3)
Chloride: 102 mmol/L (ref 98–111)
Creatinine, Ser: 0.67 mg/dL (ref 0.61–1.24)
GFR calc Af Amer: >60 mL/min (ref >60)
GFR calc non Af Amer: >60 mL/min (ref >60)
Glucose, Bld: 176 mg/dL — ABNORMAL HIGH (ref 70–99)
Potassium: 4.6 mmol/L (ref 3.5–5.1)
Sodium: 140 mmol/L (ref 135–145)
Total Bilirubin: 0.2 mg/dL — ABNORMAL LOW (ref 0.3–1.2)
Total Protein: 7.5 g/dL (ref 6.5–8.1)

## 2020-03-10 LAB — D-DIMER, QUANTITATIVE: D-Dimer, Quant: 0.86 ug/mL-FEU — ABNORMAL HIGH (ref 0.00–0.50)

## 2020-03-10 LAB — C-REACTIVE PROTEIN: CRP: 0.6 mg/dL (ref <1.0)

## 2020-03-10 MED ORDER — DEXAMETHASONE SODIUM PHOSPHATE 10 MG/ML IJ SOLN
6.0000 mg | INTRAMUSCULAR | Status: DC
  Administered 2020-03-10: 6 mg via INTRAVENOUS
  Filled 2020-03-10: qty 1

## 2020-03-10 NOTE — Progress Notes (Signed)
PROGRESS NOTE  Ryan Maxwell First  UGQ:[id number] DOB: [birth year] DOA: 03/06/2020 PCP: Patient, No Pcp Per  Brief Narrative: Ryan Maxwell is a 34 y.o. male with a history of bronchitis, tobacco use, and HTN not on medication who presented to the ED 5/30 with fever, chills, malaise, and subsequent shortness of breath worse on exertion that began 5/28 after being exposed to his nephew who had recently come out of isolation from covid-19 infection. He also had nausea and vomiting. In the ED he was hypoxemic requiring 2L O2 with no significant CXR findings, though elevated d-dimer inspired CTA chest which revealed no PE but R > L peripheral-predominant GGOs confirmed to be due to covid-19 pneumonia after SARS-CoV-2 testing was positive. Remdesivir and decadron were started.   Subjective: The patient was seen and examined this morning, awake alert oriented, on 2 L of oxygen, satting 97%.  Overnight as patient fell asleep patient's O2 sat dropped, patient is O2 was increased to 5 L of oxygen to maintain O2 sat of 92-97% This morning while awake satting 97% on 2 L of oxygen. Not complaining of shortness of breath  Per patient and nursing staff patient desatted while he sleeps he likely have obstructive sleep apnea uses CPAP at home but noncompliant-as he states the face mask is uncomfortable.      Assessment & Plan: Active Problems:   COVID-19 virus infection   Acute respiratory failure with hypoxia (HCC)   Essential hypertension  Acute hypoxemic respiratory failure  -due to covid-19 pneumonia: SARS-CoV-2 PCR positive on 5/30, onset of illness 5/28. -Worsening hypoxia -O2 demand improved down from 3 L to 2 L satting 97% this a.m. -Overnight and with sleep patient's O2 sat drops requiring up to 5 L of oxygen -this is likely due to obstructive sleep apnea   -Improving inflammatory markers including CRP down to 2.0 1.5 >> 0.7 >> 0.6   - Continue remdesivir x5 days (5/30 - 6/3).  Monitoring LFTs : AST, alk phos within normal limits, ALT 49, 57, 56, 53   today  - Continue decadron ( 6 mg twice daily-we will change to daily, likely start a prednisone taper in a.m.)  - he appears to fall into the happy hypoxic phenotype, so will need monitoring of SpO2.  - Vitamin C, zinc - Encourage OOB, IS, FV, and awake proning if able - Tylenol and antitussives prn - Isolation period recommended for 21 days from positive testing. - Check CBC w/diff, CMP, CRP daily. D-dimer stable/down, not consistent with PE as cause of worsening hypoxemia.  - Enoxaparin prophylactic dose.  - Maintain euvolemia/net negative.  - Avoid NSAIDs   History of bronchitis:  -Remained stable, minimal rhonchi no wheezing - Steroids as above.  Staphylococcus + blood culture (1 of 4):  This is most consistent with contaminant. WBC normal. -Repeat blood cultures- - Monitor further data. Will not treat for now.  HTN: Not on medications.   Tobacco use: Quit cold [place] just before symptoms on 5/25. Does not desire nicotine patch.  - Encouraged continued cessation  Obesity: Estimated body mass index is 35.26 kg/m as calculated from the following:   Height as of this encounter: 6' (1.829 m).   Weight as of this encounter: 117.9 kg.  DVT prophylaxis: Lovenox Code Status: Full Family Communication: None at bedside Disposition Plan:  Status is: Inpatient  Remains inpatient appropriate because:IV treatments appropriate due to intensity of illness or inability to take PO and Remains newly hypoxemic from covid pneumonia and will  require close monitoring for several days as he's in early course of illness.    Dispo: The patient is from: Home              Anticipated d/c is to: Home              Anticipated d/c date is: In a.m. if remains stable              Patient currently is not medically stable to d/c.  Consultants:   None  Procedures:   None  Antimicrobials:  Remdesivir    Objective: Vitals:   03/09/20 2215 03/10/20 0554 03/10/20 0832 03/10/20 1100  BP: (!) 155/95 139/84    Pulse: 75 68    Resp: 20 18    Temp: 98.2 F (36.8 C) 98 F (36.7 C)    TempSrc: Oral Oral    SpO2: 95% 97% 95%   Weight:    117.9 kg  Height:    6' (1.829 m)    Intake/Output Summary (Last 24 hours) at 03/10/2020 1151 Last data filed at 03/09/2020 1949 Gross per 24 hour  Intake 340 ml  Output --  Net 340 ml      Physical Exam  Constitution:  Alert, cooperative, no distress, remains on 2 L of oxygen currently satting 97% With sleep patient desats require 5 L of oxygen to maintain O2 sats of 92-97% Psychiatric: Normal and stable mood and affect, cognition intact,   HEENT: Normocephalic, PERRL, otherwise with in Normal limits  Chest:Chest symmetric Cardio vascular:  S1/S2, RRR, No murmure, No Rubs or Gallops  pulmonary: Improved diffuse rhonchi, respirations unlabored, negative wheezes / crackles Abdomen: Soft, non-tender, non-distended, bowel sounds,no masses, no organomegaly Muscular skeletal: Limited exam - in bed, able to move all 4 extremities, Normal strength,  Neuro: CNII-XII intact. , normal motor and sensation, reflexes intact  Extremities: No pitting edema lower extremities, +2 pulses  Skin: Dry, warm to touch, negative for any Rashes, No open wounds Wounds: per nursing documentation           Data Reviewed: I have personally reviewed following labs and imaging studies  CBC: Recent Labs  Lab 03/06/20 1633 03/07/20 0328 03/08/20 0320 03/09/20 0426 03/10/20 0339  WBC 6.4 7.2 6.3 8.7 11.0*  NEUTROABS 3.4 4.4 4.2 6.5 8.4*  HGB 13.8 13.9 14.8 14.8 14.7  HCT 47.5 47.1 49.2 49.7 49.0  MCV 94.1 92.5 90.6 91.4 90.9  PLT 186 188 215 277 096   Basic Metabolic Panel: Recent Labs  Lab 03/06/20 1633 03/07/20 0328 03/08/20 0320 03/09/20 0426 03/10/20 0339  NA 141 143 141 141 140  K 3.6 3.8 4.5 4.4 4.6  CL 104 105 102 104 102  CO2 '27 29 30 28 28   ' GLUCOSE 118* 100* 138* 142* 176*  BUN '10 11 12 15 17  ' CREATININE 0.91 0.96 0.75 0.63 0.67  CALCIUM 8.0* 8.1* 8.7* 8.5* 8.7*  MG  --  2.4  --   --   --    GFR: Estimated Creatinine Clearance: 175.7 mL/min (by C-G formula based on SCr of 0.67 mg/dL). Liver Function Tests: Recent Labs  Lab 03/06/20 1633 03/07/20 0328 03/08/20 0320 03/09/20 0426 03/10/20 0339  AST 41 40 41 31 24  ALT 49* 49* 57* 56* 53*  ALKPHOS 50 48 52 50 47  BILITOT 0.5 0.7 0.4 0.5 0.2*  PROT 7.5 7.3 8.1 7.2 7.5  ALBUMIN 3.7 3.6 3.7 3.5 3.5  Lipid Profile: No results for input(s): CHOL, HDL,  LDLCALC, TRIG, CHOLHDL, LDLDIRECT in the last 72 hours. Thyroid Function Tests: No results for input(s): TSH, T4TOTAL, FREET4, T3FREE, THYROIDAB in the last 72 hours. Anemia Panel: No results for input(s): VITAMINB12, FOLATE, FERRITIN, TIBC, IRON, RETICCTPCT in the last 72 hours. Urine analysis:    Component Value Date/Time   COLORURINE YELLOW 03/06/2020 1633   APPEARANCEUR TURBID (A) 03/06/2020 1633   LABSPEC 1.025 03/06/2020 1633   PHURINE 5.0 03/06/2020 1633   GLUCOSEU NEGATIVE 03/06/2020 1633   [place] 03/06/2020 [place] 03/06/2020 [place] 03/06/2020 1633   PROTEINUR 30 (A) 03/06/2020 1633   NITRITE NEGATIVE 03/06/2020 1633   LEUKOCYTESUR NEGATIVE 03/06/2020 1633   Recent Results (from the past 240 hour(s))  SARS CORONAVIRUS 2 (TAT 6-24 HRS) Nasopharyngeal Nasopharyngeal Swab     Status: Abnormal   Collection Time: 03/06/20  5:00 PM   Specimen: Nasopharyngeal Swab  Result Value Ref Range Status   SARS Coronavirus 2 POSITIVE (A) NEGATIVE Final    Comment: RESULT CALLED TO, READ BACK BY AND VERIFIED WITH: S.BLAINE RN [id number] 03/07/20 MCCORMICK K (NOTE) SARS-CoV-2 target nucleic acids are DETECTED. The SARS-CoV-2 RNA is generally detectable in upper and lower respiratory specimens during the acute phase of infection. Positive results are indicative of the presence  of SARS-CoV-2 RNA. Clinical correlation with patient history and other diagnostic information is  necessary to determine patient infection status. Positive results do not rule out bacterial infection or co-infection with other viruses.  The expected result is Negative. Fact Sheet for Patients: SugarRoll.be Fact Sheet for Healthcare Providers: https://www.woods-mathews.com/ This test is not yet approved or cleared by the [place] FDA and  has been authorized for detection and/or diagnosis of SARS-CoV-2 by FDA under an Emergency Use Authorization (EUA). This EUA will remain  in effect (meaning this test can be used) for t he duration of the COVID-19 declaration under Section 564(b)(1) of the Act, 21 U.S.C. section 360bbb-3(b)(1), unless the authorization is terminated or revoked sooner. Performed at [facility], [place] [address], [place], [place] [address]   SARS Coronavirus 2 by RT PCR (hospital order, performed in [facility] hospital lab) Nasopharyngeal Nasopharyngeal Swab     Status: Abnormal   Collection Time: 03/06/20  5:45 PM   Specimen: Nasopharyngeal Swab  Result Value Ref Range Status   SARS Coronavirus 2 POSITIVE (A) NEGATIVE Final    Comment: RESULT CALLED TO, READ BACK BY AND VERIFIED WITH: J.Everlean Patterson [id number] '@[id number]'  BY V.WILKINS (NOTE) SARS-CoV-2 target nucleic acids are DETECTED SARS-CoV-2 RNA is generally detectable in upper respiratory specimens  during the acute phase of infection.  Positive results are indicative  of the presence of the identified virus, but do not rule out bacterial infection or co-infection with other pathogens not detected by the test.  Clinical correlation with patient history and  other diagnostic information is necessary to determine patient infection status.  The expected result is negative. Fact Sheet for Patients:   StrictlyIdeas.no  Fact Sheet for Healthcare  Providers:   BankingDealers.co.za   This test is not yet approved or cleared by the [place] FDA and  has been authorized for detection and/or diagnosis of SARS-CoV-2 by FDA under an Emergency Use Authorization (EUA).  This EUA will remain in effect (meaning this tes t can be used) for the duration of  the COVID-19 declaration under Section 564(b)(1) of the Act, 21 U.S.C. section 360-bbb-3(b)(1), unless the authorization is terminated or revoked sooner. Performed at [facility]  [place] [address], [place], [place] [address]   Culture, blood (Routine X 2) w Reflex to ID Panel     Status: Abnormal   Collection Time: 03/07/20  3:28 AM   Specimen: BLOOD RIGHT FOREARM  Result Value Ref Range Status   Specimen Description   Final    BLOOD RIGHT FOREARM Performed at [facility] [address], [place], [place] [address]    Special Requests   Final    BOTTLES DRAWN AEROBIC ONLY Blood Culture adequate volume Performed at [facility] [address] [place], [place] [address]    Culture  Setup Time   Final    AEROBIC BOTTLE ONLY GRAM POSITIVE COCCI IN PAIRS IN TETRADS CRITICAL RESULT CALLED TO, READ BACK BY AND VERIFIED WITH: PHARMD Hartley ON 6 1 2021    Culture (A)  Final    STAPHYLOCOCCUS SPECIES (COAGULASE NEGATIVE) THE SIGNIFICANCE OF ISOLATING THIS ORGANISM FROM A SINGLE SET OF BLOOD CULTURES WHEN MULTIPLE SETS ARE DRAWN IS UNCERTAIN. PLEASE NOTIFY THE MICROBIOLOGY DEPARTMENT WITHIN ONE WEEK IF SPECIATION AND SENSITIVITIES ARE REQUIRED. Performed at [facility], [place] [address], [place], [place] [address]    Report Status 03/10/2020 FINAL  Final  Culture, blood (Routine X 2) w Reflex to ID Panel     Status: None (Preliminary result)   Collection Time: 03/07/20  3:28 AM   Specimen: BLOOD  Result Value Ref Range Status   Specimen Description   Final    BLOOD  RIGHT ANTECUBITAL Performed at [facility] [address] [place], [place] [address]    Special Requests   Final    BOTTLES DRAWN AEROBIC ONLY Blood Culture results may not be optimal due to an inadequate volume of blood received in culture bottles Performed at [facility] [address] [place],  [address]    Culture   Final    NO GROWTH 3 DAYS Performed at [facility], [place] [address], [place],  [address]    Report Status PENDING  Incomplete  Blood Culture ID Panel (Reflexed)     Status: Abnormal   Collection Time: 03/07/20  3:28 AM  Result Value Ref Range Status   Enterococcus species NOT DETECTED NOT DETECTED Final   Listeria monocytogenes NOT DETECTED NOT DETECTED Final   Staphylococcus species DETECTED (A) NOT DETECTED Final    Comment: Methicillin (oxacillin) susceptible coagulase negative staphylococcus. Possible blood culture contaminant (unless isolated from more than one blood culture draw or clinical case suggests pathogenicity). No antibiotic treatment is indicated for blood  culture contaminants. CRITICAL RESULT CALLED TO, READ BACK BY AND VERIFIED WITH: PHARMD GRIMSLEY JESSE AT [facility] ON 6 1 2021    Staphylococcus aureus (BCID) NOT DETECTED NOT DETECTED Final   Methicillin resistance NOT DETECTED NOT DETECTED Final   Streptococcus species NOT DETECTED NOT DETECTED Final   Streptococcus agalactiae NOT DETECTED NOT DETECTED Final   Streptococcus pneumoniae NOT DETECTED NOT DETECTED Final   Streptococcus pyogenes NOT DETECTED NOT DETECTED Final   Acinetobacter baumannii NOT DETECTED NOT DETECTED Final   Enterobacteriaceae species NOT DETECTED NOT DETECTED Final   Enterobacter cloacae complex NOT DETECTED NOT DETECTED Final   Escherichia coli NOT DETECTED NOT DETECTED Final   Klebsiella oxytoca NOT DETECTED NOT DETECTED Final   Klebsiella pneumoniae NOT DETECTED NOT DETECTED Final    Proteus species NOT DETECTED NOT DETECTED Final  Serratia marcescens NOT DETECTED NOT DETECTED Final   Haemophilus influenzae NOT DETECTED NOT DETECTED Final   Neisseria meningitidis NOT DETECTED NOT DETECTED Final   Pseudomonas aeruginosa NOT DETECTED NOT DETECTED Final   Candida albicans NOT DETECTED NOT DETECTED Final   Candida glabrata NOT DETECTED NOT DETECTED Final   Candida krusei NOT DETECTED NOT DETECTED Final   Candida parapsilosis NOT DETECTED NOT DETECTED Final   Candida tropicalis NOT DETECTED NOT DETECTED Final    Comment: Performed at [facility], [place] [address] [place], [place] [address]  Culture, blood (routine x 2)     Status: None (Preliminary result)   Collection Time: 03/09/20  8:13 AM   Specimen: BLOOD  Result Value Ref Range Status   Specimen Description   Final    BLOOD LEFT ANTECUBITAL Performed at [facility] [address], [place], [place] [address]    Special Requests   Final    BOTTLES DRAWN AEROBIC AND ANAEROBIC Blood Culture adequate volume Performed at [facility] [address], [place], [place] [address]    Culture   Final    NO GROWTH 1 DAY Performed at [facility], [place] [address] [place], [place] [address]    Report Status PENDING  Incomplete  Culture, blood (routine x 2)     Status: None (Preliminary result)   Collection Time: 03/09/20  8:14 AM   Specimen: BLOOD RIGHT HAND  Result Value Ref Range Status   Specimen Description   Final    BLOOD RIGHT HAND Performed at [facility] [address], [place], [place] [address]    Special Requests   Final    BOTTLES DRAWN AEROBIC AND ANAEROBIC Blood Culture adequate volume Performed at [facility] [address] [place], [place] [address]    Culture   Final    NO GROWTH 1 DAY Performed at [facility], [place] [address], [place], [place] [address]    Report Status PENDING  Incomplete       Radiology Studies: No results found.  Scheduled Meds: . albuterol  2 puff Inhalation Q4H  . vitamin C  500 mg Oral Daily  . dexamethasone (DECADRON) injection  6 mg Intravenous Q24H  . enoxaparin (LOVENOX) injection  40 mg Subcutaneous Q24H  . ipratropium  2 puff Inhalation Q4H  . sodium chloride flush  3 mL Intravenous Once  . sodium chloride flush  3 mL Intravenous Q12H  . sodium chloride flush  3 mL Intravenous Q12H  . zinc sulfate  220 mg Oral Daily   Continuous Infusions: . sodium chloride       LOS: 4 days   Time spent: 25 minutes.  Deatra James, MD [facility] [email or web address] 03/10/2020, 11:51 AM

## 2020-03-11 DIAGNOSIS — U071 COVID-19: Principal | ICD-10-CM

## 2020-03-11 DIAGNOSIS — J9601 Acute respiratory failure with hypoxia: Secondary | ICD-10-CM

## 2020-03-11 DIAGNOSIS — I10 Essential (primary) hypertension: Secondary | ICD-10-CM

## 2020-03-11 LAB — CBC WITH DIFFERENTIAL/PLATELET
Abs Immature Granulocytes: 0.35 K/uL — ABNORMAL HIGH (ref 0.00–0.07)
Basophils Absolute: 0.1 K/uL (ref 0.0–0.1)
Basophils Relative: 1 %
Eosinophils Absolute: 0 K/uL (ref 0.0–0.5)
Eosinophils Relative: 0 %
HCT: 49.9 % (ref 39.0–52.0)
Hemoglobin: 14.9 g/dL (ref 13.0–17.0)
Immature Granulocytes: 2 %
Lymphocytes Relative: 12 %
Lymphs Abs: 1.8 K/uL (ref 0.7–4.0)
MCH: 27.4 pg (ref 26.0–34.0)
MCHC: 29.9 g/dL — ABNORMAL LOW (ref 30.0–36.0)
MCV: 91.7 fL (ref 80.0–100.0)
Monocytes Absolute: 1.3 K/uL — ABNORMAL HIGH (ref 0.1–1.0)
Monocytes Relative: 9 %
Neutro Abs: 11 K/uL — ABNORMAL HIGH (ref 1.7–7.7)
Neutrophils Relative %: 76 %
Platelets: 399 K/uL (ref 150–400)
RBC: 5.44 MIL/uL (ref 4.22–5.81)
RDW: 12.8 % (ref 11.5–15.5)
WBC: 14.6 K/uL — ABNORMAL HIGH (ref 4.0–10.5)
nRBC: 0 % (ref 0.0–0.2)

## 2020-03-11 LAB — C-REACTIVE PROTEIN: CRP: 0.6 mg/dL

## 2020-03-11 LAB — COMPREHENSIVE METABOLIC PANEL
ALT: 56 U/L — ABNORMAL HIGH (ref 0–44)
AST: 29 U/L (ref 15–41)
Albumin: 3.5 g/dL (ref 3.5–5.0)
Alkaline Phosphatase: 49 U/L (ref 38–126)
Anion gap: 9 (ref 5–15)
BUN: 16 mg/dL (ref 6–20)
CO2: 26 mmol/L (ref 22–32)
Calcium: 8.9 mg/dL (ref 8.9–10.3)
Chloride: 104 mmol/L (ref 98–111)
Creatinine, Ser: 0.67 mg/dL (ref 0.61–1.24)
GFR calc Af Amer: >60 mL/min (ref >60)
GFR calc non Af Amer: >60 mL/min (ref >60)
Glucose, Bld: 195 mg/dL — ABNORMAL HIGH (ref 70–99)
Potassium: 4.4 mmol/L (ref 3.5–5.1)
Sodium: 139 mmol/L (ref 135–145)
Total Bilirubin: 0.5 mg/dL (ref 0.3–1.2)
Total Protein: 7.3 g/dL (ref 6.5–8.1)

## 2020-03-11 LAB — D-DIMER, QUANTITATIVE: D-Dimer, Quant: 0.84 ug{FEU}/mL — ABNORMAL HIGH (ref 0.00–0.50)

## 2020-03-11 MED ORDER — ALBUTEROL SULFATE HFA 108 (90 BASE) MCG/ACT IN AERS: 2.0000 | INHALATION_SPRAY | RESPIRATORY_TRACT | 3 refills | Status: DC

## 2020-03-11 MED ORDER — GUAIFENESIN-CODEINE 100-10 MG/5ML PO SOLN: 10.0000 mL | Freq: Four times a day (QID) | ORAL | 0 refills | Status: DC | PRN

## 2020-03-11 MED ORDER — ACETAMINOPHEN 325 MG PO TABS: 650.0000 mg | ORAL_TABLET | Freq: Four times a day (QID) | ORAL | 0 refills | Status: AC | PRN

## 2020-03-11 MED ORDER — ZINC SULFATE 220 (50 ZN) MG PO CAPS: 220.0000 mg | ORAL_CAPSULE | Freq: Every day | ORAL | 0 refills | Status: AC

## 2020-03-11 MED ORDER — IPRATROPIUM BROMIDE HFA 17 MCG/ACT IN AERS: 2.0000 | INHALATION_SPRAY | RESPIRATORY_TRACT | 12 refills | Status: DC

## 2020-03-11 MED ORDER — METHYLPREDNISOLONE 4 MG PO TABS
4.0000 mg | ORAL_TABLET | Freq: Every day | ORAL | 0 refills | Status: DC
Start: 2020-03-11 — End: 2021-03-14

## 2020-03-11 MED ORDER — LISINOPRIL 5 MG PO TABS: 5.0000 mg | ORAL_TABLET | Freq: Every day | ORAL | 3 refills | Status: DC

## 2020-03-11 MED ORDER — ASCORBIC ACID 500 MG PO TABS: 500.0000 mg | ORAL_TABLET | Freq: Every day | ORAL | 0 refills | Status: AC

## 2020-03-11 NOTE — Progress Notes (Signed)
SATURATION QUALIFICATIONS: (This note is used to comply with regulatory documentation for home oxygen)  Patient Saturations on Room Air at Rest = 96%  Patient Saturations on Room Air while Ambulating = 90%  Patient Saturations on 0 Liters of oxygen while Ambulating = 90%  Please briefly explain why patient needs home oxygen:

## 2020-03-11 NOTE — Progress Notes (Signed)
AVS given to patient and explained at the bedside. Medications and follow up appointments have been explained with pt verbalizing understanding.  

## 2020-03-11 NOTE — TOC Progression Note (Addendum)
Transition of Care [facility]) - Progression Note    Patient Details  Name: Ryan Maxwell MRN: [id number] Date of Birth: 07-May-1986  [facility] CM/SW Contact  Geni Bers, RN Phone Number: 03/11/2020, 10:16 AM  Clinical Narrative:    Spoke with Dr. Delford Field concerning this pt. Hospital follow up Tuesday June 8:1:30 PM at [facility] with Dr. Laverda Page. Pt was made aware and encouraged to keep this appointment.    Expected Discharge Plan: Home/Self Care Barriers to Discharge: No Barriers Identified  Expected Discharge Plan and Services Expected Discharge Plan: Home/Self Care   Discharge Planning Services: CM Consult   Living arrangements for the past 2 months: Single Family Home Expected Discharge Date: 03/11/20                                     Social Determinants of Health (SDOH) Interventions    Readmission Risk Interventions No flowsheet data found.

## 2020-03-11 NOTE — Discharge Summary (Signed)
Physician Discharge Summary [facility]    Patient: Ryan Maxwell                   Admit date: 03/06/2020   DOB: [birth year]             Discharge date:03/11/2020/11:39 AM BUL:[id number]                          PCP: Patient, No Pcp Per  Disposition: HOME  Recommendations for Outpatient Follow-up:   . Follow up: in 2 weeks  Discharge Condition: Stable   Code Status:   Code Status: Full Code  Diet recommendation: Regular healthy diet   Discharge Diagnoses:    Active Problems:   COVID-19 virus infection   Acute respiratory failure with hypoxia (HCC)   Essential hypertension   History of Present Illness/ Hospital Course Kathleen Argue Summary:   Brief Narrative: Ryan Maxwell is a 34 y.o. male with a history of bronchitis, tobacco use, and HTN not on medication who presented to the ED 5/30 with fever, chills, malaise, and subsequent shortness of breath worse on exertion that began 5/28 after being exposed to his nephew who had recently come out of isolation from covid-19 infection. He also had nausea and vomiting. In the ED he was hypoxemic requiring 2L O2 with no significant CXR findings, though elevated d-dimer inspired CTA chest which revealed no PE but R > L peripheral-predominant GGOs confirmed to be due to covid-19 pneumonia after SARS-CoV-2 testing was positive. Remdesivir and decadron were started.   Detailed discharge summary/A&P   Patient was seen and examined 03/11/2020, 8:05 AM Patient stable today. No acute distress.  No issues overnight Stable for discharge.  Per patient and nursing staff patient desatted while he sleeps he likely have obstructive sleep apnea uses CPAP at home but noncompliant-as he states the face mask is uncomfortable. ------------------------------------------------------------------------------------------------------------------  Detailed discharge summary/A&P    COVID-19 virus infection   Acute respiratory failure with  hypoxia ([place])   Essential hypertension  Acute hypoxemic respiratory failure  -due to covid-19 pneumonia: SARS-CoV-2 PCR positive on 5/30, onset of illness 5/28. -Much improved  -Overnight and with sleep patient's O2 sat drops requiring up to 5 L of oxygen -this is likely due to obstructive sleep apnea   -Improving inflammatory markers including CRP down to 2.0 1.5 >> 0.7 >> 0.6   - Continue remdesivir x5 days (5/30 - 6/3). Monitoring LFTs : AST, alk phos within normal limits, ALT 49, 57, 56, 53     - Continue decadron ( 6 mg twice daily-we will change to daily, started  prednisone taper  .  - Vitamin C, zinc - Tylenol and antitussives prn  - Isolation period recommended for 21 days from positive testing.  - Avoid NSAIDs   History of bronchitis:  -Remained stable, minimal rhonchi no wheezing - Steroids as above.  Staphylococcus + blood culture (1 of 4):  This is most consistent with contaminant. WBC normal. -Repeat blood cultures-negative to date -No signs of infection  HTN: restart home med. Of lisinopril   Tobacco use: Quit cold [place] just before symptoms on 5/25. Does not desire nicotine patch.  - Encouraged continued cessation  Obesity: Estimated body mass index is 35.26 kg/m as calculated from the following:   Height as of this encounter: 6' (1.829 m).   Weight as of this encounter: 117.9 kg.   Code Status: Full Disposition Plan:  Status is: Inpatient Dispo: The  patient is from: Home  Anticipated d/c is to: Home  Antimicrobials:  Remdesivir x 5 days        Discharge Instructions:   Discharge Instructions    Activity as tolerated - No restrictions   Complete by: As directed    Activity as tolerated - No restrictions   Complete by: As directed    Call MD for:  difficulty breathing, headache or visual disturbances   Complete by: As directed    Call MD for:  temperature >100.4   Complete by: As directed    Diet - low sodium  heart healthy   Complete by: As directed    Discharge instructions   Complete by: As directed    21 days isolation may receive your vaccine in 4-6 weeks   Increase activity slowly   Complete by: As directed    Increase activity slowly   Complete by: As directed        Medication List    STOP taking these medications   amoxicillin-clavulanate 875-125 MG tablet Commonly known as: AUGMENTIN   ibuprofen 200 MG tablet Commonly known as: ADVIL   naproxen 500 MG tablet Commonly known as: NAPROSYN     TAKE these medications   acetaminophen 325 MG tablet Commonly known as: TYLENOL Take 2 tablets (650 mg total) by mouth every 6 (six) hours as needed for mild pain or headache (fever >/= 101).   albuterol 108 (90 Base) MCG/ACT inhaler Commonly known as: VENTOLIN HFA Inhale 2 puffs into the lungs every 4 (four) hours. What changed:   when to take this  reasons to take this   ascorbic acid 500 MG tablet Commonly known as: VITAMIN C Take 1 tablet (500 mg total) by mouth daily for 5 days.   beclomethasone 40 MCG/ACT inhaler Commonly known as: QVAR Inhale 2 puffs into the lungs daily as needed (for shortness of breath).   guaiFENesin-codeine 100-10 MG/5ML syrup Take 10 mLs by mouth every 6 (six) hours as needed for cough.   ipratropium 17 MCG/ACT inhaler Commonly known as: ATROVENT HFA Inhale 2 puffs into the lungs every 4 (four) hours.   lisinopril 5 MG tablet Commonly known as: ZESTRIL Take 1 tablet (5 mg total) by mouth daily.   methylPREDNISolone 4 MG tablet Commonly known as: Medrol Take 1 tablet (4 mg total) by mouth daily.   zinc sulfate 220 (50 Zn) MG capsule Take 1 capsule (220 mg total) by mouth daily for 5 days.      Follow-up [place] Follow up on 03/15/2020.   Why: Appointment June 8 at 1:00 PM/appointment at 1:30 PM.  Please arrive early for appointment. With Dr.Wright/Pulmonology. Contact  information: [place] [phone]         Allergies  Allergen Reactions  . Dayquil [Pseudoephedrine-Apap-Dm] Shortness Of Breath  . Nyquil Multi-Symptom [Pseudoeph-Doxylamine-Dm-Apap] Shortness Of Breath     Procedures /Studies:   CT Angio Chest PE W and/or Wo Contrast  Result Date: 03/07/2020 CLINICAL DATA:  Shortness of breath COVID positive EXAM: CT ANGIOGRAPHY CHEST WITH CONTRAST TECHNIQUE: Multidetector CT imaging of the chest was performed using the standard protocol during bolus administration of intravenous contrast. Multiplanar CT image reconstructions and MIPs were obtained to evaluate the vascular anatomy. CONTRAST:  132m OMNIPAQUE IOHEXOL 350 MG/ML SOLN COMPARISON:  Mar 06, 2020 FINDINGS: Cardiovascular: There is slightly suboptimal opacification of the main pulmonary artery. No central or proximal segmental pulmonary  embolism is seen. The heart is normal in size. No pericardial effusion or thickening. No evidence right heart strain. There is normal three-vessel brachiocephalic anatomy without proximal stenosis. The thoracic aorta is normal in appearance. Mediastinum/Nodes: Scattered bilateral hilar lymph nodes are seen the largest in the right hilum measuring 1.8 cm in transverse dimension, series 4, image 59. Thyroid gland, trachea, and esophagus demonstrate no significant findings. Lungs/Pleura: Mild peripherally based ground-glass opacities are seen throughout the periphery of the right lower lung and bilateral lung bases, right greater than left. No pleural effusion or pneumothorax. Upper Abdomen: No acute abnormalities present in the visualized portions of the upper abdomen. Musculoskeletal: No chest wall abnormality. No acute or significant osseous findings. Review of the MIP images confirms the above findings. IMPRESSION: Slightly suboptimal opacification of the main pulmonary artery. No central or proximal segmental pulmonary  embolism. Patchy ground-glass opacities, right greater than left, consistent with multifocal pneumonia Right hilar adenopathy.  The Electronically Signed   By: Prudencio Pair M.D.   On: 03/07/2020 01:23   DG Chest Port 1 View  Result Date: 03/06/2020 CLINICAL DATA:  Fever and chills.  Body aches. EXAM: PORTABLE CHEST 1 VIEW COMPARISON:  November 03, 2018 FINDINGS: The heart size and mediastinal contours are within normal limits. Both lungs are clear. The visualized skeletal structures are unremarkable. IMPRESSION: No active disease. Electronically Signed   By: Dorise Bullion III M.D   On: 03/06/2020 17:46     Discharge Exam:    Vitals:   03/10/20 1100 03/10/20 1306 03/10/20 2036 03/11/20 0500  BP:  (!) 164/97 (!) 144/89 135/89  Pulse:  87 74 66  Resp:  19 18   Temp:  98.8 F (37.1 C) 97.9 F (36.6 C) 97.9 F (36.6 C)  TempSrc:  Oral Oral Oral  SpO2:  96% 92% 96%  Weight: 117.9 kg     Height: 6' (1.829 m)       General: Pt lying comfortably in bed & appears in no obvious distress. Cardiovascular: S1 & S2 heard, RRR, S1/S2 +. No murmurs, rubs, gallops or clicks. No JVD or pedal edema. Respiratory: Clear to auscultation without wheezing, rhonchi or crackles. No increased work of breathing. Abdominal:  Non-distended, non-tender & soft. No organomegaly or masses appreciated. Normal bowel sounds heard. CNS: Alert and oriented. No focal deficits. Extremities: no edema, no cyanosis    The results of significant diagnostics from this hospitalization (including imaging, microbiology, ancillary and laboratory) are listed below for reference.      Microbiology:   Recent Results (from the past 240 hour(s))  SARS CORONAVIRUS 2 (TAT 6-24 HRS) Nasopharyngeal Nasopharyngeal Swab     Status: Abnormal   Collection Time: 03/06/20  5:00 PM   Specimen: Nasopharyngeal Swab  Result Value Ref Range Status   SARS Coronavirus 2 POSITIVE (A) NEGATIVE Final    Comment: RESULT CALLED TO, READ BACK BY  AND VERIFIED WITH: S.BLAINE RN 1805 03/07/20 MCCORMICK K (NOTE) SARS-CoV-2 target nucleic acids are DETECTED. The SARS-CoV-2 RNA is generally detectable in upper and lower respiratory specimens during the acute phase of infection. Positive results are indicative of the presence of SARS-CoV-2 RNA. Clinical correlation with patient history and other diagnostic information is  necessary to determine patient infection status. Positive results do not rule out bacterial infection or co-infection with other viruses.  The expected result is Negative. Fact Sheet for Patients: SugarRoll.be Fact Sheet for Healthcare Providers: https://www.woods-mathews.com/ This test is not yet approved or cleared by the [place]  FDA and  has been authorized for detection and/or diagnosis of SARS-CoV-2 by FDA under an Emergency Use Authorization (EUA). This EUA will remain  in effect (meaning this test can be used) for t he duration of the COVID-19 declaration under Section 564(b)(1) of the Act, 21 U.S.C. section 360bbb-3(b)(1), unless the authorization is terminated or revoked sooner. Performed at [facility], [place] [address], [place], [place] [address]   SARS Coronavirus 2 by RT PCR (hospital order, performed in [facility] hospital lab) Nasopharyngeal Nasopharyngeal Swab     Status: Abnormal   Collection Time: 03/06/20  5:45 PM   Specimen: Nasopharyngeal Swab  Result Value Ref Range Status   SARS Coronavirus 2 POSITIVE (A) NEGATIVE Final    Comment: RESULT CALLED TO, READ BACK BY AND VERIFIED WITH: J.Everlean Patterson [id number]  BY V.WILKINS (NOTE) SARS-CoV-2 target nucleic acids are DETECTED SARS-CoV-2 RNA is generally detectable in upper respiratory specimens  during the acute phase of infection.  Positive results are indicative  of the presence of the identified virus, but do not rule out bacterial infection or co-infection with other pathogens  not detected by the test.  Clinical correlation with patient history and  other diagnostic information is necessary to determine patient infection status.  The expected result is negative. Fact Sheet for Patients:   StrictlyIdeas.no  Fact Sheet for Healthcare Providers:   BankingDealers.co.za   This test is not yet approved or cleared by the [place] FDA and  has been authorized for detection and/or diagnosis of SARS-CoV-2 by FDA under an Emergency Use Authorization (EUA).  This EUA will remain in effect (meaning this tes t can be used) for the duration of  the COVID-19 declaration under Section 564(b)(1) of the Act, 21 U.S.C. section 360-bbb-3(b)(1), unless the authorization is terminated or revoked sooner. Performed at [facility], [place] [address] [place], [place] [address]   Culture, blood (Routine X 2) w Reflex to ID Panel     Status: Abnormal   Collection Time: 03/07/20  3:28 AM   Specimen: BLOOD RIGHT FOREARM  Result Value Ref Range Status   Specimen Description   Final    BLOOD RIGHT FOREARM Performed at [facility] [address] [place], [place] [address]    Special Requests   Final    BOTTLES DRAWN AEROBIC ONLY Blood Culture adequate volume Performed at [place] [address], [place], [place] [address]    Culture  Setup Time   Final    AEROBIC BOTTLE ONLY GRAM POSITIVE COCCI IN PAIRS IN TETRADS CRITICAL RESULT CALLED TO, READ BACK BY AND VERIFIED WITH: PHARMD Lingle ON 6 1 2021    Culture (A)  Final    STAPHYLOCOCCUS SPECIES (COAGULASE NEGATIVE) THE SIGNIFICANCE OF ISOLATING THIS ORGANISM FROM A SINGLE SET OF BLOOD CULTURES WHEN MULTIPLE SETS ARE DRAWN IS UNCERTAIN. PLEASE NOTIFY THE MICROBIOLOGY DEPARTMENT WITHIN ONE WEEK IF SPECIATION AND SENSITIVITIES ARE REQUIRED. Performed at [facility], [place]  [address], [place], [place] [address]    Report Status 03/10/2020 FINAL  Final  Culture, blood (Routine X 2) w Reflex to ID Panel     Status: None (Preliminary result)   Collection Time: 03/07/20  3:28 AM   Specimen: BLOOD  Result Value Ref Range Status   Specimen Description   Final    BLOOD RIGHT ANTECUBITAL Performed at [facility] [address] [place], [place] [address]  Special Requests   Final    BOTTLES DRAWN AEROBIC ONLY Blood Culture results may not be optimal due to an inadequate volume of blood received in culture bottles Performed at [facility] [address] [place], [place] [address]    Culture   Final    NO GROWTH 3 DAYS Performed at [facility], [place] [address] [place], [place] [address]    Report Status PENDING  Incomplete  Blood Culture ID Panel (Reflexed)     Status: Abnormal   Collection Time: 03/07/20  3:28 AM  Result Value Ref Range Status   Enterococcus species NOT DETECTED NOT DETECTED Final   Listeria monocytogenes NOT DETECTED NOT DETECTED Final   Staphylococcus species DETECTED (A) NOT DETECTED Final    Comment: Methicillin (oxacillin) susceptible coagulase negative staphylococcus. Possible blood culture contaminant (unless isolated from more than one blood culture draw or clinical case suggests pathogenicity). No antibiotic treatment is indicated for blood  culture contaminants. CRITICAL RESULT CALLED TO, READ BACK BY AND VERIFIED WITH: PHARMD [place] ON 6 1 2021    Staphylococcus aureus (BCID) NOT DETECTED NOT DETECTED Final   Methicillin resistance NOT DETECTED NOT DETECTED Final   Streptococcus species NOT DETECTED NOT DETECTED Final   Streptococcus agalactiae NOT DETECTED NOT DETECTED Final   Streptococcus pneumoniae NOT DETECTED NOT DETECTED Final   Streptococcus pyogenes NOT DETECTED NOT DETECTED Final   Acinetobacter baumannii NOT DETECTED NOT DETECTED Final    Enterobacteriaceae species NOT DETECTED NOT DETECTED Final   Enterobacter cloacae complex NOT DETECTED NOT DETECTED Final   Escherichia coli NOT DETECTED NOT DETECTED Final   Klebsiella oxytoca NOT DETECTED NOT DETECTED Final   Klebsiella pneumoniae NOT DETECTED NOT DETECTED Final   Proteus species NOT DETECTED NOT DETECTED Final   Serratia marcescens NOT DETECTED NOT DETECTED Final   Haemophilus influenzae NOT DETECTED NOT DETECTED Final   Neisseria meningitidis NOT DETECTED NOT DETECTED Final   Pseudomonas aeruginosa NOT DETECTED NOT DETECTED Final   Candida albicans NOT DETECTED NOT DETECTED Final   Candida glabrata NOT DETECTED NOT DETECTED Final   Candida krusei NOT DETECTED NOT DETECTED Final   Candida parapsilosis NOT DETECTED NOT DETECTED Final   Candida tropicalis NOT DETECTED NOT DETECTED Final    Comment: Performed at [facility], [place] [address] [place], [place] [address]  Culture, blood (routine x 2)     Status: None (Preliminary result)   Collection Time: 03/09/20  8:13 AM   Specimen: BLOOD  Result Value Ref Range Status   Specimen Description   Final    BLOOD LEFT ANTECUBITAL Performed at [facility] [address] [place], [place] [address]    Special Requests   Final    BOTTLES DRAWN AEROBIC AND ANAEROBIC Blood Culture adequate volume Performed at [facility] [address] [place], [place] [address]    Culture   Final    NO GROWTH 1 DAY Performed at [facility], [place] [address] [place],  [address]    Report Status PENDING  Incomplete  Culture, blood (routine x 2)     Status: None (Preliminary result)   Collection Time: 03/09/20  8:14 AM   Specimen: BLOOD RIGHT HAND  Result Value Ref Range Status   Specimen Description   Final    BLOOD RIGHT HAND Performed at [facility] [address] [place],  [address]    Special  Requests   Final    BOTTLES DRAWN AEROBIC AND  ANAEROBIC Blood Culture adequate volume Performed at [place] [address] [place], [place] [address]    Culture   Final    NO GROWTH 1 DAY Performed at [facility], [place] [address], [place], [place] [address]    Report Status PENDING  Incomplete     Labs:   CBC: Recent Labs  Lab 03/07/20 0328 03/08/20 0320 03/09/20 0426 03/10/20 0339 03/11/20 0358  WBC 7.2 6.3 8.7 11.0* 14.6*  NEUTROABS 4.4 4.2 6.5 8.4* 11.0*  HGB 13.9 14.8 14.8 14.7 14.9  HCT 47.1 49.2 49.7 49.0 49.9  MCV 92.5 90.6 91.4 90.9 91.7  PLT 188 215 277 248 582   Basic Metabolic Panel: Recent Labs  Lab 03/07/20 0328 03/08/20 0320 03/09/20 0426 03/10/20 0339 03/11/20 0358  NA 143 141 141 140 139  K 3.8 4.5 4.4 4.6 4.4  CL 105 102 104 102 104  CO2 _0 GLUCOSE 100* 138* 142* 176* 195*  BUN _1 CREATININE 0.96 0.75 0.63 0.67 0.67  CALCIUM 8.1* 8.7* 8.5* 8.7* 8.9  MG 2.4  --   --   --   --    Liver Function Tests: Recent Labs  Lab 03/07/20 0328 03/08/20 0320 03/09/20 0426 03/10/20 0339 03/11/20 0358  AST 40 41 _2 ALT 49* 57* 56* 53* 56*  ALKPHOS 48 52 50 47 49  BILITOT 0.7 0.4 0.5 0.2* 0.5  PROT 7.3 8.1 7.2 7.5 7.3  ALBUMIN 3.6 3.7 3.5 3.5 3.5   BNP (last 3 results) No results for input(s): BNP in the last 8760 hours. Cardiac Enzymes: No results for input(s): CKTOTAL, CKMB, CKMBINDEX, TROPONINI in the last 168 hours. CBG: No results for input(s): GLUCAP in the last 168 hours. Hgb A1c No results for input(s): HGBA1C in the last 72 hours. Lipid Profile No results for input(s): CHOL, HDL, LDLCALC, TRIG, CHOLHDL, LDLDIRECT in the last 72 hours. Thyroid function studies No results for input(s): TSH, T4TOTAL, T3FREE, THYROIDAB in the last 72 hours.  Invalid input(s): FREET3 Anemia work up No results for input(s): VITAMINB12, FOLATE, FERRITIN, TIBC, IRON, RETICCTPCT in the last 72 hours. Urinalysis    Component Value Date/Time    COLORURINE YELLOW 03/06/2020 1633   APPEARANCEUR TURBID (A) 03/06/2020 1633   LABSPEC 1.025 03/06/2020 1633   PHURINE 5.0 03/06/2020 1633   GLUCOSEU NEGATIVE 03/06/2020 1633   HGBUR NEGATIVE 03/06/2020 1633   BILIRUBINUR NEGATIVE 03/06/2020 [place] 03/06/2020 1633   PROTEINUR 30 (A) 03/06/2020 1633   NITRITE NEGATIVE 03/06/2020 1633   [place] 03/06/2020 1633         Time coordinating discharge: Over 45 minutes  SIGNED: Deatra James, MD, FACP, FHM. [facility],  Please use [email or web address] to Page If 7PM-7AM, please contact night-coverage [email or web address] Ota [facility] 03/11/2020, 11:39 AM

## 2020-03-12 LAB — CULTURE, BLOOD (ROUTINE X 2): Culture: NO GROWTH

## 2020-03-14 LAB — CULTURE, BLOOD (ROUTINE X 2)
Culture: NO GROWTH
Culture: NO GROWTH
Special Requests: ADEQUATE
Special Requests: ADEQUATE

## 2020-03-15 ENCOUNTER — Ambulatory Visit: Payer: HRSA Program | Attending: Critical Care Medicine | Admitting: Critical Care Medicine

## 2020-03-15 ENCOUNTER — Other Ambulatory Visit: Payer: Self-pay

## 2020-03-15 ENCOUNTER — Encounter: Payer: Self-pay | Admitting: Critical Care Medicine

## 2020-03-15 DIAGNOSIS — I10 Essential (primary) hypertension: Secondary | ICD-10-CM | POA: Diagnosis not present

## 2020-03-15 DIAGNOSIS — U071 COVID-19: Secondary | ICD-10-CM

## 2020-03-15 DIAGNOSIS — G4710 Hypersomnia, unspecified: Secondary | ICD-10-CM | POA: Insufficient documentation

## 2020-03-15 DIAGNOSIS — J9601 Acute respiratory failure with hypoxia: Secondary | ICD-10-CM

## 2020-03-15 NOTE — Assessment & Plan Note (Signed)
COVID-19 viral infection with hypoxic respiratory failure slowly resolving  Patient to continue inhaled medications as prescribed along with vitamin C supplementation and prednisone taper along with zinc supplementation  We will have the patient return to the office on June 23 for an in office exam

## 2020-03-15 NOTE — Assessment & Plan Note (Addendum)
Hypertension under fair control at this time Patient is on lisinopril 5 mg daily and will maintain current medication profile

## 2020-03-15 NOTE — Assessment & Plan Note (Signed)
Acute hypoxic respiratory failure now resolved   

## 2020-03-15 NOTE — Progress Notes (Addendum)
Subjective:    Patient ID: Ryan Maxwell, male    DOB: [birth year], 34 y.o.   MRN: [id number] Virtual Visit via Telephone Note  I connected with Ryan Maxwell on 03/15/20 at  1:30 PM EDT by telephone and verified that I am speaking with the correct person using two identifiers.   Consent:  I discussed the limitations, risks, security and privacy concerns of performing an evaluation and management service by telephone and the availability of in person appointments. I also discussed with the patient that there may be a patient responsible charge related to this service. The patient expressed understanding and agreed to proceed.  Location of patient: The patient in his car  Location of provider: I am in the office  Persons participating in the televisit with the patient.   No one else on the call    History of Present Illness:  34 y.o.M here post hosp f/u and to establish pcp care  This is a 34 year old male who was just discharged from [facility] hospital for COVID-19 positive test (U07.1, COVID-19) with Acute Pneumonia (J12.89, Other viral pneumonia) (If respiratory failure or sepsis present, add as separate assessment)  And acute hypoxic respiratory failure hypertension. Has a prior history of undiagnosed sleep apnea that he presumed he had and he was borrowing his brother CPAP machine which did not work well for the patient. The patient notes increased daytime hypersomnolence and this is been a pre-existing condition. The patient was just discharged on 4 June and since then the patient's breathing has been improving. There is minimal cough. He is not requiring oxygen therapy at this time. He has had no further fever at this time. Many of his Covid symptoms have improved improved.  Below is a copy of the discharge summary Patient: Ryan Maxwell                                                      Admit date: 03/06/2020   DOB: 1986/01/21                                                             Discharge date:03/11/2020/11:39 AM FYB:[id number]  PCP: Patient, No Pcp Per  Disposition: HOME  Recommendations for Outpatient Follow-up:    Follow up: in 2 weeks  Discharge Condition: Stable   Code Status:   Code Status: Full Code  Diet recommendation: Regular healthy diet   Discharge Diagnoses:   Active Problems:   COVID-19 virus infection   Acute respiratory failure with hypoxia (HCC)   Essential hypertension   History of Present Illness/ Hospital Course Ryan Maxwell Summary:  Brief Narrative: Ryan Maxwell Ryan Maxwell a 34 y.o.malewith a history of bronchitis, tobacco use, and HTN not on medication who presented to the ED 5/30 with fever, chills, malaise, and subsequent shortness of breath worse on exertion that began 5/28 after being exposed to his nephew who had recently come out of isolation from covid-19 infection. He also had nausea and vomiting. In the ED he was hypoxemic requiring 2L O2 with no significant CXR findings, though elevated Ryan-dimer inspired CTA chest which revealed no PE but R > L peripheral-predominant GGOs confirmed to be due to covid-19 pneumonia after SARS-CoV-2 testing was positive. Remdesivir and decadron were started.   Detailed discharge summary/A&P  Patient was seen and examined 03/11/2020, 8:05 AM Patient stable today. No acute distress.  No issues overnight Stable for discharge.  Per patient and nursing staff patient desatted while he sleeps he likely have obstructive sleep apnea uses CPAP at home but noncompliant-as he states the face mask is uncomfortable. ------------------------------------------------------------------------------------------------------------------  Detailed discharge summary/A&P  COVID-19 virus  infection Acute respiratory failure with hypoxia ([place]) Essential hypertension  Acute hypoxemic respiratory failure -due to covid-19 pneumonia: SARS-CoV-2 PCR positive on 5/30, onset of illness 5/28. -Much improved  -Overnight and with sleep patient's O2 sat drops requiring up to 5 L of oxygen-this is likely due to obstructive sleep apnea  -Improving inflammatory markers including CRP down to 2.0 1.5 >> 0.7>> 0.6   - Continue remdesivir x5 days (5/30 - 6/3). Monitoring LFTs : AST, alk phos within normal limits, ALT 49, 57, 56, 53  - Continue decadron ( 6 mg twice daily-we will change to daily, started  prednisone taper .  - Vitamin C, zinc - Tylenol and antitussives prn  - Isolation period recommended for 21 days from positive testing.  - Avoid NSAIDs  History of bronchitis: -Remained stable, minimal rhonchi no wheezing - Steroids as above.  Staphylococcus + blood culture (1 of 4):  This is most consistent with contaminant. WBC normal. -Repeat blood cultures-negative to date -No signs of infection  YHC:WCBJSEG home med. Of lisinopril   Tobacco use: Quit cold [place] just before symptoms on 5/25. Does not desire nicotine patch.  - Encouraged continued cessation  Obesity:Estimated body mass index is 35.26 kg/m as calculated from the following: Height as of this encounter: 6' (1.829 m). Weight as of this encounter: 117.9 kg.   The patient was made an appointment for an in office exam on this date without realizing the patient and was so close to his positive Covid test. Were not yet set up to see patients in the office but determined we could actually do so by the time I called the patient back it was already nearly at his son and [place]. I then proceeded to do this visit over the phone.    Past Medical History:  Diagnosis Date  . Acute respiratory failure with hypoxia ([place]) 03/06/2020  . Bronchitis   . Hypertension      Family  History  Problem Relation Age of Onset  . Hypertension Other  Social History   Socioeconomic History  . Marital status: Single    Spouse name: Not on file  . Number of children: Not on file  . Years of education: Not on file  . Highest education level: Not on file  Occupational History  . Not on file  Tobacco Use  . Smoking status: Current Every Day Smoker    Packs/day: 1.00    Types: Cigarettes  . Smokeless tobacco: Never Used  Substance and Sexual Activity  . Alcohol use: Not Currently  . Drug use: Yes    Types: Marijuana  . Sexual activity: Never  Other Topics Concern  . Not on file  Social History Narrative  . Not on file   Social Determinants of Health   Financial Resource Strain:   . Difficulty of Paying Living Expenses:   Food Insecurity:   . Worried About Charity fundraiser in the Last Year:   . Arboriculturist in the Last Year:   Transportation Needs:   . Film/video editor (Medical):   Marland Kitchen Lack of Transportation (Non-Medical):   Physical Activity:   . Days of Exercise per Week:   . Minutes of Exercise per Session:   Stress:   . Feeling of Stress :   Social Connections:   . Frequency of Communication with Friends and Family:   . Frequency of Social Gatherings with Friends and Family:   . Attends Religious Services:   . Active Member of Clubs or Organizations:   . Attends Archivist Meetings:   Marland Kitchen Marital Status:   Intimate Partner Violence:   . Fear of Current or Ex-Partner:   . Emotionally Abused:   Marland Kitchen Physically Abused:   . Sexually Abused:      Allergies  Allergen Reactions  . Dayquil [Pseudoephedrine-Apap-Dm] Shortness Of Breath  . Nyquil Multi-Symptom [Pseudoeph-Doxylamine-Dm-Apap] Shortness Of Breath     Outpatient Medications Prior to Visit  Medication Sig Dispense Refill  . acetaminophen (TYLENOL) 325 MG tablet Take 2 tablets (650 mg total) by mouth every 6 (six) hours as needed for mild pain or headache (fever >/=  101). 30 tablet 0  . albuterol (VENTOLIN HFA) 108 (90 Base) MCG/ACT inhaler Inhale 2 puffs into the lungs every 4 (four) hours. 8 g 3  . ascorbic acid (VITAMIN C) 500 MG tablet Take 1 tablet (500 mg total) by mouth daily for 5 days. 5 tablet 0  . beclomethasone (QVAR) 40 MCG/ACT inhaler Inhale 2 puffs into the lungs daily as needed (for shortness of breath). 1 Inhaler 1  . guaiFENesin-codeine 100-10 MG/5ML syrup Take 10 mLs by mouth every 6 (six) hours as needed for cough. 120 mL 0  . ipratropium (ATROVENT HFA) 17 MCG/ACT inhaler Inhale 2 puffs into the lungs every 4 (four) hours. 1 Inhaler 12  . lisinopril (ZESTRIL) 5 MG tablet Take 1 tablet (5 mg total) by mouth daily. 30 tablet 3  . methylPREDNISolone (MEDROL) 4 MG tablet Take 1 tablet (4 mg total) by mouth daily. 21 tablet 0  . zinc sulfate 220 (50 Zn) MG capsule Take 1 capsule (220 mg total) by mouth daily for 5 days. 5 capsule 0  . lisinopril (PRINIVIL,ZESTRIL) 5 MG tablet Take 1 tablet (5 mg total) by mouth daily. (Patient not taking: Reported on 04/16/2017) 30 tablet 0   No facility-administered medications prior to visit.     Review of Systems  Constitutional: Positive for activity change and fatigue. Negative for chills and diaphoresis.  HENT: Positive  for congestion, postnasal drip and rhinorrhea.   Respiratory: Positive for cough and shortness of breath.   Cardiovascular: Negative.   Gastrointestinal: Negative.   Musculoskeletal: Negative.   Skin: Negative.   Neurological: Positive for weakness. Negative for tremors, seizures, syncope and speech difficulty.       Objective:   Physical Exam This is a phone visit so no exam       Assessment & Plan:  I personally reviewed all images and lab data in the [facility] system as well as any outside material available during this office visit and agree with the  radiology impressions.   COVID-19 virus infection COVID-19 viral infection with hypoxic respiratory failure slowly  resolving  Patient to continue inhaled medications as prescribed along with vitamin C supplementation and prednisone taper along with zinc supplementation  We will have the patient return to the office on June 23 for an in office exam  Acute respiratory failure with hypoxia ([place]) Acute hypoxic respiratory failure now resolved  Essential hypertension Hypertension under fair control at this time Patient is on lisinopril 5 mg daily and will maintain current medication profile  Hypersomnolence Daytime hypersomnolence with findings during the hospitalization consistent with sleep apnea  The patient is borrowing his brother's machine has not tolerated therapy at this time  We will bring the patient in for an office exam and have been obtain financial assistance at which point we will be able to see if he can get him a sleep study after he is fully cleared Covid out of his system     Follow Up Instructions:    I discussed the assessment and treatment plan with the patient. The patient was provided an opportunity to ask questions and all were answered. The patient agreed with the plan and demonstrated an understanding of the instructions.   The patient was advised to call back or seek an in-person evaluation if the symptoms worsen or if the condition fails to improve as anticipated.  I provided 30 minutes of non-face-to-face time during this encounter  including  median intraservice time , review of notes, labs, imaging, medications  and explaining diagnosis and management to the patient .    Asencion Noble, MD

## 2020-03-15 NOTE — Assessment & Plan Note (Signed)
Daytime hypersomnolence with findings during the hospitalization consistent with sleep apnea  The patient is borrowing his brother's machine has not tolerated therapy at this time  We will bring the patient in for an office exam and have been obtain financial assistance at which point we will be able to see if he can get him a sleep study after he is fully cleared Covid out of his system

## 2020-03-31 ENCOUNTER — Ambulatory Visit: Payer: Self-pay

## 2020-09-05 ENCOUNTER — Other Ambulatory Visit: Payer: Self-pay

## 2020-09-05 ENCOUNTER — Emergency Department
Admission: EM | Admit: 2020-09-05 | Discharge: 2020-09-05 | Disposition: A | Discharge status: Home / Self Care | Payer: Self-pay | Attending: Emergency Medicine | Admitting: Emergency Medicine

## 2020-09-05 DIAGNOSIS — H9201 Otalgia, right ear: Secondary | ICD-10-CM | POA: Insufficient documentation

## 2020-09-05 DIAGNOSIS — F17210 Nicotine dependence, cigarettes, uncomplicated: Secondary | ICD-10-CM | POA: Insufficient documentation

## 2020-09-05 DIAGNOSIS — Z79899 Other long term (current) drug therapy: Secondary | ICD-10-CM | POA: Insufficient documentation

## 2020-09-05 DIAGNOSIS — I10 Essential (primary) hypertension: Secondary | ICD-10-CM | POA: Insufficient documentation

## 2020-09-05 DIAGNOSIS — Z8616 Personal history of COVID-19: Secondary | ICD-10-CM | POA: Insufficient documentation

## 2020-09-05 MED ORDER — LIDOCAINE HCL (PF) 1 % IJ SOLN
2.0000 mL | Freq: Once | INTRAMUSCULAR | Status: AC
  Administered 2020-09-05: 2 mL
  Filled 2020-09-05: qty 5

## 2020-09-05 MED ORDER — NEOMYCIN-POLYMYXIN-HC 3.5-10000-1 OT SOLN: 3.0000 [drp] | Freq: Three times a day (TID) | OTIC | 0 refills | Status: AC

## 2020-09-05 MED ORDER — NEOMYCIN-POLYMYXIN-HC 3.5-10000-1 OT SUSP
3.0000 [drp] | Freq: Once | OTIC | Status: AC
  Administered 2020-09-05: 3 [drp] via OTIC
  Filled 2020-09-05: qty 10

## 2020-09-05 NOTE — ED Notes (Signed)
Pt had to be awoken from sleep to administer medication and review discharge instructions.

## 2020-09-05 NOTE — ED Provider Notes (Signed)
[facility] Emergency Department Provider Note   ____________________________________________   First MD Initiated Contact with Patient 09/05/20 [phone]     (approximate)  I have reviewed the triage vital signs and the nursing notes.   HISTORY  Chief Complaint Otalgia    HPI [name] is a 34 y.o. male who presents to the ED from home with a 1 day history of right-sided earache.  Denies fever, discharge, barotrauma, nausea/vomiting or dizziness.  Used over-the-counter ear pain reliever without relief of symptoms.     Past Medical History:  Diagnosis Date  . Acute respiratory failure with hypoxia (HCC) 03/06/2020  . Bronchitis   . Hypertension     Patient Active Problem List   Diagnosis Date Noted  . Hypersomnolence 03/15/2020  . COVID-19 virus infection 03/06/2020  . Essential hypertension 03/06/2020    No past surgical history on file.  Prior to Admission medications   Medication Sig Start Date End Date Taking? Authorizing Provider  albuterol (VENTOLIN HFA) 108 (90 Base) MCG/ACT inhaler Inhale 2 puffs into the lungs every 4 (four) hours. 03/11/20 04/10/20  ShahmehdiGemma Payor, MD  beclomethasone (QVAR) 40 MCG/ACT inhaler Inhale 2 puffs into the lungs daily as needed (for shortness of breath). 11/03/18   Willy Eddy, MD  guaiFENesin-codeine 100-10 MG/5ML syrup Take 10 mLs by mouth every 6 (six) hours as needed for cough. 03/11/20   Shahmehdi, Gemma Payor, MD  ipratropium (ATROVENT HFA) 17 MCG/ACT inhaler Inhale 2 puffs into the lungs every 4 (four) hours. 03/11/20   Shahmehdi, Gemma Payor, MD  lisinopril (ZESTRIL) 5 MG tablet Take 1 tablet (5 mg total) by mouth daily. 03/11/20 04/10/20  Shahmehdi, Gemma Payor, MD  methylPREDNISolone (MEDROL) 4 MG tablet Take 1 tablet (4 mg total) by mouth daily. 03/11/20   Kendell Bane, MD    Allergies Dayquil [pseudoephedrine-apap-dm] and Nyquil multi-symptom [pseudoeph-doxylamine-dm-apap]  Family History  Problem  Relation Age of Onset  . Hypertension Other     Social History Social History   Tobacco Use  . Smoking status: Current Every Day Smoker    Packs/day: 1.00    Types: Cigarettes  . Smokeless tobacco: Never Used  Vaping Use  . Vaping Use: Never used  Substance Use Topics  . Alcohol use: Not Currently  . Drug use: Yes    Types: Marijuana    Review of Systems  Constitutional: No fever/chills Eyes: No visual changes. ENT: Positive for right ear pain.  No sore throat. Cardiovascular: Denies chest pain. Respiratory: Denies shortness of breath. Gastrointestinal: No abdominal pain.  No nausea, no vomiting.  No diarrhea.  No constipation. Genitourinary: Negative for dysuria. Musculoskeletal: Negative for back pain. Skin: Negative for rash. Neurological: Negative for headaches, focal weakness or numbness.   ____________________________________________   PHYSICAL EXAM:  VITAL SIGNS: ED Triage Vitals  Enc Vitals Group     BP 09/05/20 0524 (!) 177/102     Pulse Rate 09/05/20 0524 70     Resp 09/05/20 0524 16     Temp 09/05/20 0524 98 F (36.7 C)     Temp Source 09/05/20 0524 Oral     SpO2 09/05/20 0524 100 %     Weight 09/05/20 0525 280 lb (127 kg)     Height 09/05/20 0525 6' (1.829 m)     Head Circumference --      Peak Flow --      Pain Score 09/05/20 0524 10     Pain Loc --  Pain Edu? --      Excl. in GC? --     Constitutional: Alert and oriented. Well appearing and in no acute distress. Eyes: Conjunctivae are normal. PERRL. EOMI. Head: Atraumatic. Ears: Right external ear and canal unremarkable.  Right TM obscured by soft cerumen which was removed with a Q-tip to reveal inflammatous TM without rupture. Nose: No congestion/rhinnorhea. Mouth/Throat: Mucous membranes are moist.  Oropharynx non-erythematous. Neck: No stridor.   Cardiovascular: Normal rate, regular rhythm. Grossly normal heart sounds.  Good peripheral circulation. Respiratory: Normal respiratory  effort.  No retractions. Lungs CTAB. Gastrointestinal: Soft and nontender. No distention. No abdominal bruits. No CVA tenderness. Musculoskeletal: No lower extremity tenderness nor edema.  No joint effusions. Neurologic:  Normal speech and language. No gross focal neurologic deficits are appreciated. No gait instability. Skin:  Skin is warm, dry and intact. No rash noted. Psychiatric: Mood and affect are normal. Speech and behavior are normal.  ____________________________________________   LABS (all labs ordered are listed, but only abnormal results are displayed)  Labs Reviewed - No data to display ____________________________________________  EKG  None ____________________________________________  RADIOLOGY I, Janelli Welling J, personally viewed and evaluated these images (plain radiographs) as part of my medical decision making, as well as reviewing the written report by the radiologist.  ED MD interpretation: None  Official radiology report(s): No results found.  ____________________________________________   PROCEDURES  Procedure(s) performed (including Critical Care):  Procedures   ____________________________________________   INITIAL IMPRESSION / ASSESSMENT AND PLAN / ED COURSE  As part of my medical decision making, I reviewed the following data within the electronic MEDICAL RECORD NUMBER Nursing notes reviewed and incorporated and [place] Controlled Substance Database     34 year old male presenting with right otalgia.  Lidocaine drops applied for pain.  Given inflammatory looking TM, will cover with Cortisporin otic drops.  Patient will follow up with ENT as needed.  Strict return precautions given.  Patient verbalizes understanding and agrees with plan of care.      ____________________________________________   FINAL CLINICAL IMPRESSION(S) / ED DIAGNOSES  Final diagnoses:  Otalgia of right ear     ED Discharge Orders    None      *Please note:   Ryan Maxwell was evaluated in Emergency Department on 09/05/2020 for the symptoms described in the history of present illness. He was evaluated in the context of the global COVID-19 pandemic, which necessitated consideration that the patient might be at risk for infection with the SARS-CoV-2 virus that causes COVID-19. Institutional protocols and algorithms that pertain to the evaluation of patients at risk for COVID-19 are in a state of rapid change based on information released by regulatory bodies including the CDC and federal and state organizations. These policies and algorithms were followed during the patient's care in the ED.  Some ED evaluations and interventions may be delayed as a result of limited staffing during and the pandemic.*   Note:  This document was prepared using Dragon voice recognition software and may include unintentional dictation errors.   Irean Hong, MD 09/05/20 [phone]

## 2020-09-05 NOTE — Discharge Instructions (Signed)
Apply antibiotic eardrops 3 drops to right ear 3 times daily x7 days.  Return to the ER for worsening symptoms, persistent vomiting, fever or other concerns.

## 2020-09-05 NOTE — ED Triage Notes (Signed)
Pt with right sided ear ache this am.

## 2021-03-13 ENCOUNTER — Emergency Department (HOSPITAL_COMMUNITY): Payer: Self-pay

## 2021-03-13 ENCOUNTER — Encounter (HOSPITAL_COMMUNITY): Payer: Self-pay | Admitting: *Deleted

## 2021-03-13 ENCOUNTER — Other Ambulatory Visit: Payer: Self-pay

## 2021-03-13 ENCOUNTER — Emergency Department (HOSPITAL_COMMUNITY)
Admission: EM | Admit: 2021-03-13 | Discharge: 2021-03-13 | Disposition: A | Discharge status: Home / Self Care | Payer: Self-pay | Attending: Emergency Medicine | Admitting: Emergency Medicine

## 2021-03-13 DIAGNOSIS — Z5321 Procedure and treatment not carried out due to patient leaving prior to being seen by health care provider: Secondary | ICD-10-CM | POA: Insufficient documentation

## 2021-03-13 DIAGNOSIS — M542 Cervicalgia: Secondary | ICD-10-CM | POA: Insufficient documentation

## 2021-03-13 DIAGNOSIS — R519 Headache, unspecified: Secondary | ICD-10-CM | POA: Insufficient documentation

## 2021-03-13 DIAGNOSIS — M25512 Pain in left shoulder: Secondary | ICD-10-CM | POA: Insufficient documentation

## 2021-03-13 DIAGNOSIS — Y92410 Unspecified street and highway as the place of occurrence of the external cause: Secondary | ICD-10-CM | POA: Insufficient documentation
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
## 2021-03-14 ENCOUNTER — Emergency Department
Admission: EM | Admit: 2021-03-14 | Discharge: 2021-03-14 | Disposition: A | Discharge status: Home / Self Care | Payer: No Typology Code available for payment source | Attending: Emergency Medicine | Admitting: Emergency Medicine

## 2021-03-14 ENCOUNTER — Emergency Department: Payer: No Typology Code available for payment source

## 2021-03-14 ENCOUNTER — Encounter: Payer: Self-pay | Admitting: Emergency Medicine

## 2021-03-14 DIAGNOSIS — Z8616 Personal history of COVID-19: Secondary | ICD-10-CM | POA: Diagnosis not present

## 2021-03-14 DIAGNOSIS — M25512 Pain in left shoulder: Secondary | ICD-10-CM | POA: Diagnosis not present

## 2021-03-14 DIAGNOSIS — Y92410 Unspecified street and highway as the place of occurrence of the external cause: Secondary | ICD-10-CM | POA: Insufficient documentation

## 2021-03-14 DIAGNOSIS — M7918 Myalgia, other site: Secondary | ICD-10-CM

## 2021-03-14 DIAGNOSIS — S161XXA Strain of muscle, fascia and tendon at neck level, initial encounter: Secondary | ICD-10-CM | POA: Insufficient documentation

## 2021-03-14 DIAGNOSIS — I10 Essential (primary) hypertension: Secondary | ICD-10-CM | POA: Insufficient documentation

## 2021-03-14 DIAGNOSIS — F17210 Nicotine dependence, cigarettes, uncomplicated: Secondary | ICD-10-CM | POA: Insufficient documentation

## 2021-03-14 DIAGNOSIS — M25552 Pain in left hip: Secondary | ICD-10-CM | POA: Diagnosis not present

## 2021-03-14 DIAGNOSIS — Z79899 Other long term (current) drug therapy: Secondary | ICD-10-CM | POA: Diagnosis not present

## 2021-03-14 DIAGNOSIS — S199XXA Unspecified injury of neck, initial encounter: Secondary | ICD-10-CM | POA: Diagnosis present

## 2021-03-14 MED ORDER — KETOROLAC TROMETHAMINE 60 MG/2ML IM SOLN
60.0000 mg | Freq: Once | INTRAMUSCULAR | Status: AC
  Administered 2021-03-14: 60 mg via INTRAMUSCULAR
  Filled 2021-03-14: qty 2

## 2021-03-14 MED ORDER — ORPHENADRINE CITRATE 30 MG/ML IJ SOLN
60.0000 mg | Freq: Two times a day (BID) | INTRAMUSCULAR | Status: DC
  Administered 2021-03-14: 60 mg via INTRAMUSCULAR
  Filled 2021-03-14: qty 2

## 2021-03-14 MED ORDER — OXYCODONE-ACETAMINOPHEN 7.5-325 MG PO TABS: 1.0000 | ORAL_TABLET | ORAL | 0 refills | Status: DC | PRN

## 2021-03-14 MED ORDER — ORPHENADRINE CITRATE ER 100 MG PO TB12
100.0000 mg | ORAL_TABLET | Freq: Two times a day (BID) | ORAL | 0 refills | Status: DC
Start: 2021-03-14 — End: 2021-07-28

## 2021-03-14 MED ORDER — KETOROLAC TROMETHAMINE 10 MG PO TABS
10.0000 mg | ORAL_TABLET | Freq: Four times a day (QID) | ORAL | 0 refills | Status: DC | PRN
Start: 2021-03-14 — End: 2021-11-22

## 2021-03-14 MED ORDER — HYDROMORPHONE HCL 1 MG/ML IJ SOLN
1.0000 mg | Freq: Once | INTRAMUSCULAR | Status: AC
  Administered 2021-03-14: 1 mg via INTRAMUSCULAR
  Filled 2021-03-14: qty 1

## 2021-03-14 NOTE — Discharge Instructions (Signed)
Read and follow discharge care instruction.  Take medication as directed. 
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
## 2021-04-04 ENCOUNTER — Emergency Department (HOSPITAL_COMMUNITY)
Admission: EM | Admit: 2021-04-04 | Discharge: 2021-04-04 | Disposition: A | Discharge status: Home / Self Care | Payer: Self-pay | Attending: Emergency Medicine | Admitting: Emergency Medicine

## 2021-04-04 ENCOUNTER — Encounter (HOSPITAL_COMMUNITY): Payer: Self-pay

## 2021-04-04 DIAGNOSIS — F17210 Nicotine dependence, cigarettes, uncomplicated: Secondary | ICD-10-CM | POA: Insufficient documentation

## 2021-04-04 DIAGNOSIS — Z8616 Personal history of COVID-19: Secondary | ICD-10-CM | POA: Insufficient documentation

## 2021-04-04 DIAGNOSIS — A084 Viral intestinal infection, unspecified: Secondary | ICD-10-CM | POA: Insufficient documentation

## 2021-04-04 DIAGNOSIS — I10 Essential (primary) hypertension: Secondary | ICD-10-CM | POA: Insufficient documentation

## 2021-04-04 DIAGNOSIS — Z79899 Other long term (current) drug therapy: Secondary | ICD-10-CM | POA: Insufficient documentation

## 2021-04-04 LAB — CBC WITH DIFFERENTIAL/PLATELET
Abs Immature Granulocytes: 0.06 K/uL (ref 0.00–0.07)
Basophils Absolute: 0 K/uL (ref 0.0–0.1)
Basophils Relative: 0 %
Eosinophils Absolute: 0.1 K/uL (ref 0.0–0.5)
Eosinophils Relative: 1 %
HCT: 51 % (ref 39.0–52.0)
Hemoglobin: 15.5 g/dL (ref 13.0–17.0)
Immature Granulocytes: 1 %
Lymphocytes Relative: 21 %
Lymphs Abs: 1.6 K/uL (ref 0.7–4.0)
MCH: 27.5 pg (ref 26.0–34.0)
MCHC: 30.4 g/dL (ref 30.0–36.0)
MCV: 90.4 fL (ref 80.0–100.0)
Monocytes Absolute: 1.3 K/uL — ABNORMAL HIGH (ref 0.1–1.0)
Monocytes Relative: 16 %
Neutro Abs: 4.9 K/uL (ref 1.7–7.7)
Neutrophils Relative %: 61 %
Platelets: 222 K/uL (ref 150–400)
RBC: 5.64 MIL/uL (ref 4.22–5.81)
RDW: 13.8 % (ref 11.5–15.5)
WBC: 8 K/uL (ref 4.0–10.5)
nRBC: 0 % (ref 0.0–0.2)

## 2021-04-04 LAB — COMPREHENSIVE METABOLIC PANEL WITH GFR
ALT: 32 U/L (ref 0–44)
AST: 20 U/L (ref 15–41)
Albumin: 4 g/dL (ref 3.5–5.0)
Alkaline Phosphatase: 71 U/L (ref 38–126)
Anion gap: 7 (ref 5–15)
BUN: 16 mg/dL (ref 6–20)
CO2: 26 mmol/L (ref 22–32)
Calcium: 8.6 mg/dL — ABNORMAL LOW (ref 8.9–10.3)
Chloride: 105 mmol/L (ref 98–111)
Creatinine, Ser: 0.73 mg/dL (ref 0.61–1.24)
GFR, Estimated: >60 mL/min
Glucose, Bld: 94 mg/dL (ref 70–99)
Potassium: 3.7 mmol/L (ref 3.5–5.1)
Sodium: 138 mmol/L (ref 135–145)
Total Bilirubin: 0.7 mg/dL (ref 0.3–1.2)
Total Protein: 7.8 g/dL (ref 6.5–8.1)

## 2021-04-04 LAB — LIPASE, BLOOD: Lipase: 20 U/L (ref 11–51)

## 2021-04-04 MED ORDER — DICYCLOMINE HCL 10 MG PO CAPS
10.0000 mg | ORAL_CAPSULE | Freq: Once | ORAL | Status: AC
  Administered 2021-04-04: 10 mg via ORAL
  Filled 2021-04-04: qty 1

## 2021-04-04 MED ORDER — LIDOCAINE VISCOUS HCL 2 % MT SOLN
15.0000 mL | Freq: Once | OROMUCOSAL | Status: AC
  Administered 2021-04-04: 15 mL via ORAL
  Filled 2021-04-04: qty 15

## 2021-04-04 MED ORDER — DICYCLOMINE HCL 20 MG PO TABS: 20.0000 mg | ORAL_TABLET | Freq: Two times a day (BID) | ORAL | 0 refills | Status: DC

## 2021-04-04 MED ORDER — ONDANSETRON 4 MG PO TBDP
4.0000 mg | ORAL_TABLET | Freq: Three times a day (TID) | ORAL | 0 refills | Status: DC | PRN
Start: 2021-04-04 — End: 2021-07-28

## 2021-04-04 MED ORDER — ALUM & MAG HYDROXIDE-SIMETH 200-200-20 MG/5ML PO SUSP
30.0000 mL | Freq: Once | ORAL | Status: AC
  Administered 2021-04-04: 30 mL via ORAL
  Filled 2021-04-04: qty 30

## 2021-04-04 MED ORDER — ONDANSETRON 4 MG PO TBDP
4.0000 mg | ORAL_TABLET | Freq: Once | ORAL | Status: AC
  Administered 2021-04-04: 4 mg via ORAL
  Filled 2021-04-04: qty 1

## 2021-04-04 MED ORDER — SODIUM CHLORIDE 0.9 % IV BOLUS
1000.0000 mL | Freq: Once | INTRAVENOUS | Status: AC
  Administered 2021-04-04: 1000 mL via INTRAVENOUS

## 2021-04-04 NOTE — ED Notes (Signed)
Pt informed we need urine sample. Pt denies urge to urinate at this time

## 2021-04-04 NOTE — ED Provider Notes (Signed)
[facility]-EMERGENCY DEPT Provider Note   CSN: [id number] Arrival date & time: 04/04/21  1312     History Chief Complaint  Patient presents with   Vomiting    Ryan Maxwell is a 35 y.o. male.  HPI  Pt complains of epigastric pain, nausea and vomiting.  Patient states that he had some precooked chicken from [employer] for lunch and started to develop severe indigestion and nausea with nonbloody nonbilious vomiting.  He is also had watery diarrhea.  Feels like he may need  vomit "sometimes".  Denies any fevers.  Denies any urinary symptoms.  Reports burping frequently with foul taste.      Past Medical History:  Diagnosis Date   Acute respiratory failure with hypoxia (HCC) 03/06/2020   Bronchitis    Hypertension    not taking medication at this time    Patient Active Problem List   Diagnosis Date Noted   Hypersomnolence 03/15/2020   COVID-19 virus infection 03/06/2020   Essential hypertension 03/06/2020    History reviewed. No pertinent surgical history.     Family History  Problem Relation Age of Onset   Hypertension Other     Social History   Tobacco Use   Smoking status: Every Day    Packs/day: 1.00    Pack years: 0.00    Types: Cigarettes   Smokeless tobacco: Never  Vaping Use   Vaping Use: Never used  Substance Use Topics   Alcohol use: Not Currently   Drug use: Yes    Types: Marijuana    Home Medications Prior to Admission medications   Medication Sig Start Date End Date Taking? Authorizing Provider  dicyclomine (BENTYL) 20 MG tablet Take 1 tablet (20 mg total) by mouth 2 (two) times daily. 04/04/21  Yes Mare Ferrari, PA-C  ondansetron (ZOFRAN ODT) 4 MG disintegrating tablet Take 1 tablet (4 mg total) by mouth every 8 (eight) hours as needed for nausea or vomiting. 04/04/21  Yes Mare Ferrari, PA-C  albuterol (VENTOLIN HFA) 108 (90 Base) MCG/ACT inhaler Inhale 2 puffs into the lungs every 4 (four) hours. 03/11/20 04/10/20   ShahmehdiGemma Payor, MD  beclomethasone (QVAR) 40 MCG/ACT inhaler Inhale 2 puffs into the lungs daily as needed (for shortness of breath). 11/03/18   Willy Eddy, MD  ipratropium (ATROVENT HFA) 17 MCG/ACT inhaler Inhale 2 puffs into the lungs every 4 (four) hours. 03/11/20   Shahmehdi, Gemma Payor, MD  ketorolac (TORADOL) 10 MG tablet Take 1 tablet (10 mg total) by mouth every 6 (six) hours as needed. 03/14/21   Joni Reining, PA-C  lisinopril (ZESTRIL) 5 MG tablet Take 1 tablet (5 mg total) by mouth daily. 03/11/20 04/10/20  Shahmehdi, Gemma Payor, MD  orphenadrine (NORFLEX) 100 MG tablet Take 1 tablet (100 mg total) by mouth 2 (two) times daily. 03/14/21   Joni Reining, PA-C  oxyCODONE-acetaminophen (PERCOCET) 7.5-325 MG tablet Take 1 tablet by mouth every 4 (four) hours as needed for severe pain. 03/14/21 03/14/22  Joni Reining, PA-C    Allergies    Dayquil [pseudoephedrine-apap-dm] and Nyquil multi-symptom [pseudoeph-doxylamine-dm-apap]  Review of Systems   Review of Systems  Constitutional:  Negative for chills and fever.  HENT:  Negative for ear pain and sore throat.   Eyes:  Negative for pain and visual disturbance.  Respiratory:  Negative for cough and shortness of breath.   Cardiovascular:  Negative for chest pain and palpitations.  Gastrointestinal:  Positive for abdominal pain, diarrhea, nausea  and vomiting.  Genitourinary:  Negative for dysuria and hematuria.  Musculoskeletal:  Negative for arthralgias and back pain.  Skin:  Negative for color change and rash.  Neurological:  Negative for seizures and syncope.  All other systems reviewed and are negative.  Physical Exam Updated Vital Signs BP 140/86 (BP Location: Left Arm)   Pulse 80   Temp 98.1 F (36.7 C) (Oral)   Resp 18   SpO2 91%   Physical Exam Vitals and nursing note reviewed.  Constitutional:      Appearance: He is well-developed. He is not ill-appearing or diaphoretic.  HENT:     Head: Normocephalic and atraumatic.   Eyes:     Conjunctiva/sclera: Conjunctivae normal.  Cardiovascular:     Rate and Rhythm: Normal rate and regular rhythm.     Heart sounds: No murmur heard. Pulmonary:     Effort: Pulmonary effort is normal. No respiratory distress.     Breath sounds: Normal breath sounds.  Abdominal:     Palpations: Abdomen is soft.     Tenderness: There is abdominal tenderness. There is no right CVA tenderness or left CVA tenderness.     Comments: Minimal epigastric tenderness, no CVA tenderness  Musculoskeletal:        General: Normal range of motion.     Cervical back: Neck supple.  Skin:    General: Skin is warm and dry.  Neurological:     General: No focal deficit present.     Mental Status: He is alert.     Sensory: No sensory deficit.    ED Results / Procedures / Treatments   Labs (all labs ordered are listed, but only abnormal results are displayed) Labs Reviewed  CBC WITH DIFFERENTIAL/PLATELET - Abnormal; Notable for the following components:      Result Value   Monocytes Absolute 1.3 (*)    All other components within normal limits  COMPREHENSIVE METABOLIC PANEL - Abnormal; Notable for the following components:   Calcium 8.6 (*)    All other components within normal limits  LIPASE, BLOOD  URINALYSIS, ROUTINE W REFLEX MICROSCOPIC    EKG None  Radiology No results found.  Procedures Procedures   Medications Ordered in ED Medications  alum & mag hydroxide-simeth (MAALOX/MYLANTA) 200-200-20 MG/5ML suspension 30 mL (30 mLs Oral Given 04/04/21 1625)    And  lidocaine (XYLOCAINE) 2 % viscous mouth solution 15 mL (15 mLs Oral Given 04/04/21 1625)  dicyclomine (BENTYL) capsule 10 mg (10 mg Oral Given 04/04/21 1625)  ondansetron (ZOFRAN-ODT) disintegrating tablet 4 mg (4 mg Oral Given 04/04/21 1625)  sodium chloride 0.9 % bolus 1,000 mL (0 mLs Intravenous Stopped 04/04/21 1900)    ED Course  I have reviewed the triage vital signs and the nursing notes.  Pertinent labs &  imaging results that were available during my care of the patient were reviewed by me and considered in my medical decision making (see chart for details).    MDM Rules/Calculators/A&P                          35 year old male with nausea, vomiting, diarrhea, epigastric pain.  On arrival, he is uncomfortable appearing, however nontoxic, in no acute distress.  Physical exam with minimal epigastric tenderness, no CVA tenderness.  Low suspicion for surgical abdomen, pancreatitis, cholecystitis, renal stones, small bowel obstruction, diverticulitis.  CMP and CBC unremarkable.  Normal lipase.  Suspect viral gastroenteritis.  Plan for GI cocktail, Bentyl, Zofran, fluids.  Patient was hypertensive here in the ER, states he does not take anything for his blood pressures.  His blood pressure did improve throughout his ED stay.  Tolerated oral fluids well, received a liter of normal saline.  Unable to provide urine, low suspicion for UTI as he has no urinary symptoms.  Will prescribe Zofran, Bentyl.  Encouraged PCP follow-up, will refer to [facility] community health and wellness for BP check.  We discussed return precautions.  He voiced understanding is agreeable.  Stable for discharge. Final Clinical Impression(s) / ED Diagnoses Final diagnoses:  Viral gastroenteritis    Rx / DC Orders ED Discharge Orders          Ordered    ondansetron (ZOFRAN ODT) 4 MG disintegrating tablet  Every 8 hours PRN        04/04/21 1918    dicyclomine (BENTYL) 20 MG tablet  2 times daily        04/04/21 1918             Leone Brand 04/04/21 1919    Milagros Loll, MD 04/05/21 1005

## 2021-04-04 NOTE — ED Provider Notes (Signed)
Emergency Medicine Provider Triage Evaluation Note  Daven Montz Aigner , a 35 y.o. male  was evaluated in triage.  Pt complains of epigastric pain, nausea and vomiting.  Patient states that he had some precooked chicken from [employer] for lunch and started to develop severe indigestion and nausea with nonbloody nonbilious vomiting.  He is also had watery diarrhea.  Feels like he may need "sometimes".  Denies any fevers.  Denies any urinary symptoms.  Review of Systems  Positive: As above Negative: As above  Physical Exam  BP (!) 163/108 (BP Location: Left Arm)   Pulse 84   Temp 98.1 F (36.7 C) (Oral)   Resp 18   SpO2 95%  Gen:   Awake, no distress   Resp:  Normal effort  MSK:   Moves extremities without difficulty  Other:  Mild generalized epigastric tenderness  Medical Decision Making  Medically screening exam initiated at 1:34 PM.  Appropriate orders placed.  Antiono D Harnden was informed that the remainder of the evaluation will be completed by another provider, this initial triage assessment does not replace that evaluation, and the importance of remaining in the ED until their evaluation is complete.     Mare Ferrari, PA-C 04/04/21 1336    Milagros Loll, MD 04/05/21 1005

## 2021-04-04 NOTE — ED Triage Notes (Signed)
Pt arrived via EMS, from home, c/o abd pain, n/v and diarrhea after eating precooked chicken from [employer].

## 2021-04-04 NOTE — Discharge Instructions (Addendum)
You were evaluated in the Emergency Department and after careful evaluation, we did not find any emergent condition requiring admission or further testing in the hospital. Please take the nausea medicine as needed, take Bentyl for abdominal cramping.  Continue to drink plenty of fluids, reintroduce food slowly.  Please follow-up with [facility] community health and wellness for follow-up on your blood pressure.   Please return to the Emergency Department if you experience any worsening of your condition.   Thank you for allowing [place] to be a part of your care.

## 2021-07-17 ENCOUNTER — Emergency Department: Payer: Self-pay

## 2021-07-17 ENCOUNTER — Emergency Department
Admission: EM | Admit: 2021-07-17 | Discharge: 2021-07-18 | Disposition: A | Discharge status: Home / Self Care | Payer: Self-pay | Attending: Emergency Medicine | Admitting: Emergency Medicine

## 2021-07-17 ENCOUNTER — Other Ambulatory Visit: Payer: Self-pay

## 2021-07-17 DIAGNOSIS — Z8616 Personal history of COVID-19: Secondary | ICD-10-CM | POA: Insufficient documentation

## 2021-07-17 DIAGNOSIS — J029 Acute pharyngitis, unspecified: Secondary | ICD-10-CM

## 2021-07-17 DIAGNOSIS — Z20822 Contact with and (suspected) exposure to covid-19: Secondary | ICD-10-CM | POA: Insufficient documentation

## 2021-07-17 DIAGNOSIS — I10 Essential (primary) hypertension: Secondary | ICD-10-CM

## 2021-07-17 DIAGNOSIS — J1089 Influenza due to other identified influenza virus with other manifestations: Secondary | ICD-10-CM | POA: Insufficient documentation

## 2021-07-17 DIAGNOSIS — Z79899 Other long term (current) drug therapy: Secondary | ICD-10-CM | POA: Insufficient documentation

## 2021-07-17 DIAGNOSIS — J039 Acute tonsillitis, unspecified: Secondary | ICD-10-CM

## 2021-07-17 DIAGNOSIS — F17210 Nicotine dependence, cigarettes, uncomplicated: Secondary | ICD-10-CM | POA: Insufficient documentation

## 2021-07-17 LAB — COMPREHENSIVE METABOLIC PANEL
ALT: 37 U/L (ref 0–44)
AST: 25 U/L (ref 15–41)
Albumin: 4.1 g/dL (ref 3.5–5.0)
Alkaline Phosphatase: 70 U/L (ref 38–126)
Anion gap: 10 (ref 5–15)
BUN: 12 mg/dL (ref 6–20)
CO2: 27 mmol/L (ref 22–32)
Calcium: 9 mg/dL (ref 8.9–10.3)
Chloride: 100 mmol/L (ref 98–111)
Creatinine, Ser: 0.87 mg/dL (ref 0.61–1.24)
GFR, Estimated: >60 mL/min (ref >60)
Glucose, Bld: 105 mg/dL — ABNORMAL HIGH (ref 70–99)
Potassium: 4.3 mmol/L (ref 3.5–5.1)
Sodium: 137 mmol/L (ref 135–145)
Total Bilirubin: 0.6 mg/dL (ref 0.3–1.2)
Total Protein: 8 g/dL (ref 6.5–8.1)

## 2021-07-17 LAB — CBC
HCT: 49.8 % (ref 39.0–52.0)
Hemoglobin: 15.8 g/dL (ref 13.0–17.0)
MCH: 28.2 pg (ref 26.0–34.0)
MCHC: 31.7 g/dL (ref 30.0–36.0)
MCV: 88.8 fL (ref 80.0–100.0)
Platelets: 246 10*3/uL (ref 150–400)
RBC: 5.61 MIL/uL (ref 4.22–5.81)
RDW: 13.2 % (ref 11.5–15.5)
WBC: 11.1 10*3/uL — ABNORMAL HIGH (ref 4.0–10.5)
nRBC: 0 % (ref 0.0–0.2)

## 2021-07-17 MED ORDER — DIPHENHYDRAMINE HCL 50 MG/ML IJ SOLN
25.0000 mg | Freq: Once | INTRAMUSCULAR | Status: AC
  Administered 2021-07-17: 25 mg via INTRAVENOUS
  Filled 2021-07-17: qty 1

## 2021-07-17 MED ORDER — IOHEXOL 350 MG/ML SOLN
75.0000 mL | Freq: Once | INTRAVENOUS | Status: AC | PRN
  Administered 2021-07-17: 75 mL via INTRAVENOUS
  Filled 2021-07-17: qty 75

## 2021-07-17 MED ORDER — METHYLPREDNISOLONE SODIUM SUCC 125 MG IJ SOLR
125.0000 mg | Freq: Once | INTRAMUSCULAR | Status: AC
  Administered 2021-07-17: 125 mg via INTRAVENOUS
  Filled 2021-07-17: qty 2

## 2021-07-17 MED ORDER — SODIUM CHLORIDE 0.9 % IV BOLUS
1000.0000 mL | Freq: Once | INTRAVENOUS | Status: AC
  Administered 2021-07-18: 1000 mL via INTRAVENOUS

## 2021-07-17 NOTE — ED Provider Notes (Signed)
[facility] Emergency Department Provider Note   ____________________________________________   Event Date/Time   First MD Initiated Contact with Patient 07/17/21 2024     (approximate)  I have reviewed the triage vital signs and the nursing notes.   HISTORY  Chief Complaint Sore Throat    HPI Ryan Maxwell is a 35 y.o. male who presents for sore throat  LOCATION: Throat DURATION: Began last night TIMING: Worsening today SEVERITY: Moderate QUALITY: Sore throat and throat swelling CONTEXT: Patient states that he took "1 chip challenge" 2 days prior to arrival and 1 day prior to the symptoms occurring.  Beginning last night he began to feel some difficulty with swallowing and soreness in his throat that has worsened today MODIFYING FACTORS: Denies any exacerbating or relieving factors ASSOCIATED SYMPTOMS: Throat swelling   Per medical record review, patient has history of obesity          Past Medical History:  Diagnosis Date   Acute respiratory failure with hypoxia (HCC) 03/06/2020   Bronchitis    Hypertension    not taking medication at this time    Patient Active Problem List   Diagnosis Date Noted   Hypersomnolence 03/15/2020   COVID-19 virus infection 03/06/2020   Essential hypertension 03/06/2020    No past surgical history on file.  Prior to Admission medications   Medication Sig Start Date End Date Taking? Authorizing Provider  albuterol (VENTOLIN HFA) 108 (90 Base) MCG/ACT inhaler Inhale 2 puffs into the lungs every 4 (four) hours. 03/11/20 04/10/20  ShahmehdiGemma Payor, MD  beclomethasone (QVAR) 40 MCG/ACT inhaler Inhale 2 puffs into the lungs daily as needed (for shortness of breath). 11/03/18   Willy Eddy, MD  dicyclomine (BENTYL) 20 MG tablet Take 1 tablet (20 mg total) by mouth 2 (two) times daily. 04/04/21   Mare Ferrari, PA-C  ipratropium (ATROVENT HFA) 17 MCG/ACT inhaler Inhale 2 puffs into the lungs every 4  (four) hours. 03/11/20   Shahmehdi, Gemma Payor, MD  ketorolac (TORADOL) 10 MG tablet Take 1 tablet (10 mg total) by mouth every 6 (six) hours as needed. 03/14/21   Joni Reining, PA-C  lisinopril (ZESTRIL) 5 MG tablet Take 1 tablet (5 mg total) by mouth daily. 03/11/20 04/10/20  Shahmehdi, Gemma Payor, MD  ondansetron (ZOFRAN ODT) 4 MG disintegrating tablet Take 1 tablet (4 mg total) by mouth every 8 (eight) hours as needed for nausea or vomiting. 04/04/21   Mare Ferrari, PA-C  orphenadrine (NORFLEX) 100 MG tablet Take 1 tablet (100 mg total) by mouth 2 (two) times daily. 03/14/21   Joni Reining, PA-C  oxyCODONE-acetaminophen (PERCOCET) 7.5-325 MG tablet Take 1 tablet by mouth every 4 (four) hours as needed for severe pain. 03/14/21 03/14/22  Joni Reining, PA-C    Allergies Dayquil [pseudoephedrine-apap-dm] and Nyquil multi-symptom [pseudoeph-doxylamine-dm-apap]  Family History  Problem Relation Age of Onset   Hypertension Other     Social History Social History   Tobacco Use   Smoking status: Every Day    Packs/day: 1.00    Types: Cigarettes   Smokeless tobacco: Never  Vaping Use   Vaping Use: Never used  Substance Use Topics   Alcohol use: Not Currently   Drug use: Yes    Types: Marijuana    Review of Systems Constitutional: No fever/chills Eyes: No visual changes. ENT: Endorses sore throat and swelling Cardiovascular: Denies chest pain. Respiratory: Denies shortness of breath. Gastrointestinal: No abdominal pain.  No nausea, no  vomiting.  No diarrhea. Genitourinary: Negative for dysuria. Musculoskeletal: Negative for acute arthralgias Skin: Negative for rash. Neurological: Negative for headaches, weakness/numbness/paresthesias in any extremity Psychiatric: Negative for suicidal ideation/homicidal ideation   ____________________________________________   PHYSICAL EXAM:  VITAL SIGNS: ED Triage Vitals  Enc Vitals Group     BP 07/17/21 2021 (!) 159/124     Pulse Rate  07/17/21 2021 (!) 114     Resp 07/17/21 2021 20     Temp 07/17/21 2021 99 F (37.2 C)     Temp Source 07/17/21 2021 Oral     SpO2 07/17/21 2021 94 %     Weight 07/17/21 2022 289 lb (131.1 kg)     Height 07/17/21 2022 6' (1.829 m)     Head Circumference --      Peak Flow --      Pain Score 07/17/21 2021 10     Pain Loc --      Pain Edu? --      Excl. in GC? --    Constitutional: Alert and oriented. Well appearing and in no acute distress. Eyes: Conjunctivae are normal. PERRL. Head: Atraumatic. Nose: No congestion/rhinnorhea. Mouth/Throat: Erythema in the posterior oropharynx but unable to appreciate peritonsillar area due to habitus.  Mucous membranes are moist. Neck: Muffled voice.  No stridor Cardiovascular: Grossly normal heart sounds.  Good peripheral circulation. Respiratory: Normal respiratory effort.  No retractions. Gastrointestinal: Soft and nontender. No distention. Musculoskeletal: No obvious deformities Neurologic:  Normal speech and language. No gross focal neurologic deficits are appreciated. Skin:  Skin is warm and dry. No rash noted. Psychiatric: Mood and affect are normal. Speech and behavior are normal.  ____________________________________________   LABS (all labs ordered are listed, but only abnormal results are displayed)  Labs Reviewed - No data to display Radiology pending  PROCEDURES  Procedure(s) performed (including Critical Care):  Procedures   ____________________________________________   INITIAL IMPRESSION / ASSESSMENT AND PLAN / ED COURSE  As part of my medical decision making, I reviewed the following data within the electronic medical record, if available:  Nursing notes reviewed and incorporated, Labs reviewed, EKG interpreted, Old chart reviewed, Radiograph reviewed and Notes from prior ED visits reviewed and incorporated      Patient is a 35 year old male who presents for approximately 24 hours of sore throat and throat swelling.   Patient denies any fevers at home but is tachycardic and mildly febrile here.  Differential diagnosis includes but is not limited to: Peritonsillar abscess, angioedema, pharyngitis, RPA  CT soft tissue of the neck is pending at the time of signout  Dispo: Pending      ____________________________________________   FINAL CLINICAL IMPRESSION(S) / ED DIAGNOSES  Final diagnoses:  Sore throat     ED Discharge Orders     None        Note:  This document was prepared using Dragon voice recognition software and may include unintentional dictation errors.    Merwyn Katos, MD 07/17/21 2124

## 2021-07-17 NOTE — ED Triage Notes (Signed)
Pt in with co sore throat, headache and cough.

## 2021-07-18 ENCOUNTER — Emergency Department: Payer: Self-pay

## 2021-07-18 ENCOUNTER — Encounter: Payer: Self-pay | Admitting: Radiology

## 2021-07-18 LAB — RESP PANEL BY RT-PCR (FLU A&B, COVID) ARPGX2
Influenza A by PCR: NEGATIVE
Influenza B by PCR: NEGATIVE
SARS Coronavirus 2 by RT PCR: NEGATIVE

## 2021-07-18 LAB — GROUP A STREP BY PCR: Group A Strep by PCR: NOT DETECTED

## 2021-07-18 MED ORDER — AMOXICILLIN-POT CLAVULANATE 875-125 MG PO TABS: 1.0000 | ORAL_TABLET | Freq: Once | ORAL | Status: DC

## 2021-07-18 MED ORDER — AMOXICILLIN-POT CLAVULANATE 875-125 MG PO TABS: 1.0000 | ORAL_TABLET | Freq: Two times a day (BID) | ORAL | 0 refills | Status: DC

## 2021-07-18 MED ORDER — AMLODIPINE BESYLATE 5 MG PO TABS: 5.0000 mg | ORAL_TABLET | Freq: Every day | ORAL | 0 refills | Status: DC

## 2021-07-18 MED ORDER — SODIUM CHLORIDE 0.9 % IV SOLN
3.0000 g | Freq: Once | INTRAVENOUS | Status: AC
  Administered 2021-07-18: 3 g via INTRAVENOUS

## 2021-07-18 MED ORDER — SODIUM CHLORIDE 0.9 % IV SOLN
3.0000 g | Freq: Once | INTRAVENOUS | Status: DC
  Filled 2021-07-18: qty 8

## 2021-07-18 NOTE — Discharge Instructions (Addendum)
1.  Take antibiotic as prescribed (Augmentin 875mg  twice daily x7 days). 2.  Gargle with warm salt water several times daily as needed for sore throat. 3.  Start Amlodipine every day to control your blood pressure. 4.  Return to the ER for worsening symptoms, persistent vomiting, fever, difficulty breathing or other concerns.

## 2021-07-18 NOTE — ED Provider Notes (Signed)
-----------------------------------------   12:56 AM on 07/18/2021 -----------------------------------------   CT unremarkable for PTA.  He is currently eating graham crackers and peanut butter while awaiting results of his swabs.  Also asking to get back on blood pressure medications which he has not been taking for 1 year.   ----------------------------------------- 1:49 AM on 07/18/2021 -----------------------------------------  Respiratory panel and rapid strep negative.  Will continue patient on Augmentin and he will follow-up with ENT as needed.  Amlodipine prescribed for high blood pressure.  Strict return precautions given.  Patient and family member verbalized understanding agree with plan of care.   Irean Hong, MD 07/18/21 [phone]

## 2021-07-25 ENCOUNTER — Emergency Department: Payer: Self-pay

## 2021-07-25 ENCOUNTER — Other Ambulatory Visit: Payer: Self-pay

## 2021-07-25 ENCOUNTER — Encounter: Payer: Self-pay | Admitting: Emergency Medicine

## 2021-07-25 ENCOUNTER — Inpatient Hospital Stay
Admission: EM | Admit: 2021-07-25 | Discharge: 2021-07-28 | DRG: 177 | Disposition: A | Discharge status: Home / Self Care | Payer: Self-pay | Attending: Internal Medicine | Admitting: Internal Medicine

## 2021-07-25 DIAGNOSIS — J22 Unspecified acute lower respiratory infection: Secondary | ICD-10-CM

## 2021-07-25 DIAGNOSIS — E669 Obesity, unspecified: Secondary | ICD-10-CM | POA: Diagnosis present

## 2021-07-25 DIAGNOSIS — J9602 Acute respiratory failure with hypercapnia: Secondary | ICD-10-CM | POA: Diagnosis present

## 2021-07-25 DIAGNOSIS — Z888 Allergy status to other drugs, medicaments and biological substances status: Secondary | ICD-10-CM

## 2021-07-25 DIAGNOSIS — R0902 Hypoxemia: Secondary | ICD-10-CM

## 2021-07-25 DIAGNOSIS — Z8249 Family history of ischemic heart disease and other diseases of the circulatory system: Secondary | ICD-10-CM

## 2021-07-25 DIAGNOSIS — R0602 Shortness of breath: Secondary | ICD-10-CM

## 2021-07-25 DIAGNOSIS — J9601 Acute respiratory failure with hypoxia: Secondary | ICD-10-CM | POA: Diagnosis present

## 2021-07-25 DIAGNOSIS — R590 Localized enlarged lymph nodes: Secondary | ICD-10-CM | POA: Diagnosis present

## 2021-07-25 DIAGNOSIS — Z7951 Long term (current) use of inhaled steroids: Secondary | ICD-10-CM

## 2021-07-25 DIAGNOSIS — F17210 Nicotine dependence, cigarettes, uncomplicated: Secondary | ICD-10-CM | POA: Diagnosis present

## 2021-07-25 DIAGNOSIS — I10 Essential (primary) hypertension: Secondary | ICD-10-CM | POA: Diagnosis present

## 2021-07-25 DIAGNOSIS — Z8616 Personal history of COVID-19: Secondary | ICD-10-CM

## 2021-07-25 DIAGNOSIS — U071 COVID-19: Principal | ICD-10-CM | POA: Diagnosis present

## 2021-07-25 DIAGNOSIS — Z79899 Other long term (current) drug therapy: Secondary | ICD-10-CM

## 2021-07-25 DIAGNOSIS — J1282 Pneumonia due to coronavirus disease 2019: Secondary | ICD-10-CM | POA: Diagnosis present

## 2021-07-25 DIAGNOSIS — Z6839 Body mass index (BMI) 39.0-39.9, adult: Secondary | ICD-10-CM

## 2021-07-25 HISTORY — DX: Unspecified acute lower respiratory infection: J22

## 2021-07-25 LAB — BASIC METABOLIC PANEL
Anion gap: 9 (ref 5–15)
BUN: 10 mg/dL (ref 6–20)
CO2: 32 mmol/L (ref 22–32)
Calcium: 8.7 mg/dL — ABNORMAL LOW (ref 8.9–10.3)
Chloride: 98 mmol/L (ref 98–111)
Creatinine, Ser: 0.88 mg/dL (ref 0.61–1.24)
GFR, Estimated: >60 mL/min (ref >60)
Glucose, Bld: 104 mg/dL — ABNORMAL HIGH (ref 70–99)
Potassium: 3.8 mmol/L (ref 3.5–5.1)
Sodium: 139 mmol/L (ref 135–145)

## 2021-07-25 LAB — CBC
HCT: 47.3 % (ref 39.0–52.0)
Hemoglobin: 15.2 g/dL (ref 13.0–17.0)
MCH: 28.5 pg (ref 26.0–34.0)
MCHC: 32.1 g/dL (ref 30.0–36.0)
MCV: 88.7 fL (ref 80.0–100.0)
Platelets: 224 10*3/uL (ref 150–400)
RBC: 5.33 MIL/uL (ref 4.22–5.81)
RDW: 13.5 % (ref 11.5–15.5)
WBC: 11.5 10*3/uL — ABNORMAL HIGH (ref 4.0–10.5)
nRBC: 0 % (ref 0.0–0.2)

## 2021-07-25 LAB — RESP PANEL BY RT-PCR (FLU A&B, COVID) ARPGX2
Influenza A by PCR: NEGATIVE
Influenza B by PCR: NEGATIVE
SARS Coronavirus 2 by RT PCR: NEGATIVE

## 2021-07-25 LAB — TROPONIN I (HIGH SENSITIVITY)
Troponin I (High Sensitivity): 55 ng/L — ABNORMAL HIGH (ref <18)
Troponin I (High Sensitivity): 48 ng/L — ABNORMAL HIGH (ref <18)

## 2021-07-25 MED ORDER — HYDROCOD POLST-CPM POLST ER 10-8 MG/5ML PO SUER: 5.0000 mL | Freq: Two times a day (BID) | ORAL | Status: DC | PRN

## 2021-07-25 MED ORDER — ACETAMINOPHEN 325 MG PO TABS
650.0000 mg | ORAL_TABLET | Freq: Four times a day (QID) | ORAL | Status: DC | PRN
  Administered 2021-07-26: 650 mg via ORAL
  Filled 2021-07-25: qty 2

## 2021-07-25 MED ORDER — METOPROLOL TARTRATE 5 MG/5ML IV SOLN: 5.0000 mg | Freq: Four times a day (QID) | INTRAVENOUS | Status: DC | PRN

## 2021-07-25 MED ORDER — MELATONIN 5 MG PO TABS
5.0000 mg | ORAL_TABLET | Freq: Every day | ORAL | Status: DC
  Administered 2021-07-25 – 2021-07-27 (×3): 5 mg via ORAL
  Filled 2021-07-25 (×3): qty 1

## 2021-07-25 MED ORDER — AMLODIPINE BESYLATE 5 MG PO TABS
5.0000 mg | ORAL_TABLET | Freq: Every day | ORAL | Status: DC
  Administered 2021-07-26 – 2021-07-28 (×3): 5 mg via ORAL
  Filled 2021-07-25 (×3): qty 1

## 2021-07-25 MED ORDER — ONDANSETRON HCL 4 MG/2ML IJ SOLN: 4.0000 mg | Freq: Four times a day (QID) | INTRAMUSCULAR | Status: DC | PRN

## 2021-07-25 MED ORDER — BENZONATATE 100 MG PO CAPS
100.0000 mg | ORAL_CAPSULE | Freq: Three times a day (TID) | ORAL | Status: DC | PRN
  Administered 2021-07-25: 100 mg via ORAL
  Filled 2021-07-25: qty 1

## 2021-07-25 MED ORDER — ALBUTEROL SULFATE HFA 108 (90 BASE) MCG/ACT IN AERS
2.0000 | INHALATION_SPRAY | Freq: Four times a day (QID) | RESPIRATORY_TRACT | Status: DC | PRN
  Filled 2021-07-25: qty 6.7

## 2021-07-25 MED ORDER — LISINOPRIL 10 MG PO TABS
5.0000 mg | ORAL_TABLET | Freq: Every day | ORAL | Status: DC
  Administered 2021-07-26 – 2021-07-28 (×3): 5 mg via ORAL
  Filled 2021-07-25 (×3): qty 1

## 2021-07-25 MED ORDER — SODIUM CHLORIDE 0.9 % IV SOLN
200.0000 mg | Freq: Once | INTRAVENOUS | Status: AC
  Administered 2021-07-25: 200 mg via INTRAVENOUS
  Filled 2021-07-25: qty 200

## 2021-07-25 MED ORDER — DEXAMETHASONE 6 MG PO TABS
6.0000 mg | ORAL_TABLET | ORAL | Status: DC
  Administered 2021-07-25 – 2021-07-27 (×3): 6 mg via ORAL
  Filled 2021-07-25 (×5): qty 1

## 2021-07-25 MED ORDER — HYDROCODONE-ACETAMINOPHEN 5-325 MG PO TABS
1.0000 | ORAL_TABLET | ORAL | Status: DC | PRN
  Administered 2021-07-25: 2 via ORAL
  Filled 2021-07-25: qty 2

## 2021-07-25 MED ORDER — ENOXAPARIN SODIUM 80 MG/0.8ML IJ SOSY
0.5000 mg/kg | PREFILLED_SYRINGE | INTRAMUSCULAR | Status: DC
  Administered 2021-07-25 – 2021-07-27 (×3): 65 mg via SUBCUTANEOUS
  Filled 2021-07-25 (×4): qty 0.65

## 2021-07-25 MED ORDER — SENNOSIDES-DOCUSATE SODIUM 8.6-50 MG PO TABS: 1.0000 | ORAL_TABLET | Freq: Every evening | ORAL | Status: DC | PRN

## 2021-07-25 MED ORDER — BISACODYL 5 MG PO TBEC: 5.0000 mg | DELAYED_RELEASE_TABLET | Freq: Every day | ORAL | Status: DC | PRN

## 2021-07-25 MED ORDER — ACETAMINOPHEN 650 MG RE SUPP
650.0000 mg | Freq: Four times a day (QID) | RECTAL | Status: DC | PRN
  Filled 2021-07-25: qty 1

## 2021-07-25 MED ORDER — ONDANSETRON HCL 4 MG PO TABS: 4.0000 mg | ORAL_TABLET | Freq: Four times a day (QID) | ORAL | Status: DC | PRN

## 2021-07-25 MED ORDER — ADULT MULTIVITAMIN W/MINERALS CH
1.0000 | ORAL_TABLET | Freq: Every day | ORAL | Status: DC
  Administered 2021-07-25 – 2021-07-28 (×4): 1 via ORAL
  Filled 2021-07-25 (×4): qty 1

## 2021-07-25 MED ORDER — SODIUM CHLORIDE 0.9 % IV SOLN
100.0000 mg | Freq: Every day | INTRAVENOUS | Status: DC
  Administered 2021-07-26 – 2021-07-28 (×3): 100 mg via INTRAVENOUS
  Filled 2021-07-25 (×3): qty 100

## 2021-07-25 MED ORDER — NICOTINE 14 MG/24HR TD PT24
14.0000 mg | MEDICATED_PATCH | Freq: Every day | TRANSDERMAL | Status: DC
  Administered 2021-07-25 – 2021-07-27 (×3): 14 mg via TRANSDERMAL
  Filled 2021-07-25 (×3): qty 1

## 2021-07-25 MED ORDER — MORPHINE SULFATE (PF) 2 MG/ML IV SOLN: 2.0000 mg | INTRAVENOUS | Status: DC | PRN

## 2021-07-25 MED ORDER — SODIUM CHLORIDE 0.9 % IV SOLN: INTRAVENOUS | Status: DC | PRN

## 2021-07-25 NOTE — Consult Note (Signed)
Remdesivir - Pharmacy Brief Note   O:  ALT: 37 on 10/10 CXR: No pneumonia by radiography, chronic bilateral hilar lymphadenopathy, present for many years. Question sarcoid. SpO2: 94%% on 4L [place]   A/P:  Remdesivir 200 mg IVPB once followed by 100 mg IVPB daily x 4 days.   Doloris Hall, PharmD Pharmacy Resident  07/25/2021 1:32 PM

## 2021-07-25 NOTE — ED Triage Notes (Addendum)
Patient to ER for shortness of breath while laying. Pain in chest with coughing. Patient states he went home from work last night d/t not being able to smell anything or taste anything. States he took home Covid test that was positive. Per EMS, patient was 88% on room air. Arrived on 4L O2.

## 2021-07-25 NOTE — H&P (Signed)
History and Physical    Ryan Maxwell FVC:[id number] DOB: 05/30/1986 DOA: 07/25/2021  PCP: Patient, No Pcp Per (Inactive) patient confirms that he does not have a primary care doctor Patient coming from: Home  I have personally briefly reviewed patient's old medical records in [facility].  Chief Complaint: Shortness of breath  HPI: Ryan Maxwell is a 35 y.o. male with medical history significant for hypertension untreated until recently, history of COVID infection 2021, who presents to the emergency department on 07/25/2021 with shortness of breath. Onset of patient's symptoms was in the evening on 07/24/21 and duration is constantly present. Shortness of breath is alleviated by nothing and exacerbated by walking/exertion. Shortness of breath initially was with exertion, but on admission he is short of breath even with sitting.  He ate some spicy food and was surprised that it did not taste like anything, and realized he had lost his sense of taste and smell. Associated symptoms: He felt ill and had fatigue.  Had muscle aches all over.  He did have some lateral right chest wall pain that did not radiate, was mild to moderate, was characterized as sharp, and occurred only when he coughed.  He has had coughing that is sometimes productive of sputum.  No wheezing.  No known fever.  No leg swelling.  No palpitations, abdominal pain, vomiting, diarrhea.  Patient reports some of his symptoms are similar to when he had COVID in 2021.  He does not use oxygen at home.  ED Course: Patient had had peripheral oxygen saturation of 88% by EMS. Emergency department SARS-CoV-2 PCR test was negative, but patient had a home Covid test that was positive.  Other labs include WBCs 11.5, initial troponin 55 and second troponin 48.  Chest x-ray showed bilateral hilar lymphadenopathy, which per radiologist has been present for many years; radiologist questions whether patient could have sarcoidosis; no acute  infiltrate.  Patient was placed on oxygen and eventually was requiring 4 L oxygen.  Review of Systems: As per HPI otherwise all other systems reviewed and are unremarable. NEUROLOGICAL: No headache or focal weakness.    Past Medical History:  Diagnosis Date   Acute respiratory failure with hypoxia (HCC) 03/06/2020   Bronchitis    Hypertension    He reports he has never been tested for sleep apnea. He reports he has no history of sarcoidosis.  Past surgical history: History reviewed. No pertinent surgical history.  Social History  reports that he has been smoking cigarettes. He has been smoking an average of 1 pack per day. He has never used smokeless tobacco. He reports that he does not currently use alcohol. He reports current drug use. Drug: Marijuana.    Allergies  Allergen Reactions   Dayquil [Pseudoephedrine-Apap-Dm] Shortness Of Breath   Nyquil Multi-Symptom [Pseudoeph-Doxylamine-Dm-Apap] Shortness Of Breath    Family History  Problem Relation Age of Onset   Hypertension Mother    Hypothyroidism Mother    Hypertension Other    No known family history of sarcoidosis.   Home Medications  Prior to Admission medications   Medication Sig Start Date End Date Taking? Authorizing Provider  albuterol (VENTOLIN HFA) 108 (90 Base) MCG/ACT inhaler Inhale 2 puffs into the lungs every 4 (four) hours. 03/11/20 04/10/20  Shahmehdi, Gemma Payor, MD  amLODipine (NORVASC) 5 MG tablet Take 1 tablet (5 mg total) by mouth daily. 07/18/21   Irean Hong, MD  amoxicillin-clavulanate (AUGMENTIN) 875-125 MG tablet Take 1 tablet by mouth 2 (two) times  daily. 07/18/21   Irean Hong, MD  beclomethasone (QVAR) 40 MCG/ACT inhaler Inhale 2 puffs into the lungs daily as needed (for shortness of breath). 11/03/18   Willy Eddy, MD  dicyclomine (BENTYL) 20 MG tablet Take 1 tablet (20 mg total) by mouth 2 (two) times daily. 04/04/21   Mare Ferrari, PA-C  ipratropium (ATROVENT HFA) 17 MCG/ACT inhaler  Inhale 2 puffs into the lungs every 4 (four) hours. 03/11/20   Shahmehdi, Gemma Payor, MD  ketorolac (TORADOL) 10 MG tablet Take 1 tablet (10 mg total) by mouth every 6 (six) hours as needed. 03/14/21   Joni Reining, PA-C  lisinopril (ZESTRIL) 5 MG tablet Take 1 tablet (5 mg total) by mouth daily. 03/11/20 04/10/20  Shahmehdi, Gemma Payor, MD  ondansetron (ZOFRAN ODT) 4 MG disintegrating tablet Take 1 tablet (4 mg total) by mouth every 8 (eight) hours as needed for nausea or vomiting. 04/04/21   Mare Ferrari, PA-C  orphenadrine (NORFLEX) 100 MG tablet Take 1 tablet (100 mg total) by mouth 2 (two) times daily. 03/14/21   Joni Reining, PA-C  oxyCODONE-acetaminophen (PERCOCET) 7.5-325 MG tablet Take 1 tablet by mouth every 4 (four) hours as needed for severe pain. 03/14/21 03/14/22  Joni Reining, PA-C    Physical Exam: Vitals:   07/25/21 0938 07/25/21 0939 07/25/21 1000 07/25/21 1130  BP: (!) 133/99  (!) 149/98 (!) 153/102  Pulse: (!) 106  (!) 102 90  Resp: 20  18 (!) 27  Temp: 98.7 F (37.1 C)     TempSrc: Oral     SpO2: 97%  93% 94%  Weight:  131.1 kg    Height:  6' (1.829 m)      Constitutional:  calm, obese, ill-appearing. Vitals:   07/25/21 0938 07/25/21 0939 07/25/21 1000 07/25/21 1130  BP: (!) 133/99  (!) 149/98 (!) 153/102  Pulse: (!) 106  (!) 102 90  Resp: 20  18 (!) 27  Temp: 98.7 F (37.1 C)     TempSrc: Oral     SpO2: 97%  93% 94%  Weight:  131.1 kg    Height:  6' (1.829 m)     Eyes: Pupils equal and round, lids and conjunctivae without icterus or erythema. ENMT: Mucous membranes are dry. Posterior pharynx clear of any exudate or lesions. Nares patent without discharge or bleeding.  Normocephalic, atraumatic.  Normal dentition.  Neck: normal, supple, no masses, trachea midline.  Thyroid nontender, no masses appreciated, no thyromegaly.  Large neck circumference. Respiratory: clear to auscultation bilaterally. Chest wall movements are symmetric. No wheezing, no crackles.  No  rhonchi. No accessory muscle use.  Oxygen by nasal cannula in place.  Intermittent tachypnea.  Decreased breath sounds in the bases. Cardiovascular: Regular rate and rhythm, no murmurs / rubs / gallops. Pulses: DP pulses 2+ bilaterally. No carotid bruits.  Capillary refill less than 3 seconds. Edema: None bilaterally. GI: soft, non-distended, normal active bowel sounds. No hepatosplenomegaly. No rigidity, rebound, or guarding. Non-tender. No masses palpated.  Musculoskeletal: no clubbing / cyanosis. No joint deformity upper and lower extremities. Good ROM, no contractures. Normal muscle tone.  No deformity in the back bilaterally.  Mild lower back tenderness bilaterally. Integument: no rashes, lesions, ulcers. No induration. Clean, dry, intact. Neurologic: CN 2-12 grossly intact. Sensation grossly intact to light touch. DTR 2+ bilaterally.  Babinski: Toes downgoing bilaterally.  Strength 5/5 in all 4.  Intact rapid alternating movements bilaterally.  No pronator drift. Psychiatric: Normal judgment and insight.  Alert and oriented x 3. Normal mood.  Normal and appropriate affect. Lymphatic: No cervical lymphadenopathy. No supraclavicular lymphadenopathy.   Labs on Admission: I have personally reviewed the following labs and imaging studies.  CBC: Recent Labs  Lab 07/25/21 0946  WBC 11.5*  HGB 15.2  HCT 47.3  MCV 88.7  PLT 224    Basic Metabolic Panel: Recent Labs  Lab 07/25/21 0946  NA 139  K 3.8  CL 98  CO2 32  GLUCOSE 104*  BUN 10  CREATININE 0.88  CALCIUM 8.7*    GFR: Estimated Creatinine Clearance: 167.2 mL/min (by C-G formula based on SCr of 0.88 mg/dL).  Liver Function Tests: No results for input(s): AST, ALT, ALKPHOS, BILITOT, PROT, ALBUMIN in the last 168 hours.   Radiological Exams on Admission: DG Chest 2 View  Result Date: 07/25/2021 CLINICAL DATA:  Shortness of breath.  Coronavirus infection. EXAM: CHEST - 2 VIEW COMPARISON:  07/18/2021.  CT 03/07/2020.  FINDINGS: Heart size is normal. Chronic bilateral hilar lymphadenopathy. No sign of infiltrate, collapse or effusion. Bony structures are unremarkable. IMPRESSION: No pneumonia by radiography. Chronic bilateral hilar lymphadenopathy, present for many years. Question sarcoid. Electronically Signed   By: Paulina Fusi M.D.   On: 07/25/2021 10:38    EKG: Independently reviewed. 102 bpm.  Sinus tachycardia.  J-point elevation in leads V2 and V3.  Flat T wave in lead III.  Compared to previous EKG from 03/07/2020, J-point elevation in V2 and V3 is old.     Assessment/Plan  Principal Problem:   Lower respiratory tract infection due to COVID-19 virus Home COVID test was positive.  Symptoms began less than 12 hours before arrival.  Clinical picture is most consistent with a COVID infection, despite the SARS-CoV-2 PCR test that was negative in the emergency department. Plan: Continuous oxygen support.  COVID isolation. Incentive spirometry. IV dexamethasone. IV remdesivir.   Active Problems:    Acute respiratory failure with hypoxia (HCC) Patient has tachypnea, does not use oxygen at home, and had peripheral oxygen saturation of 88% by EMS. Plan: Oxygen by nasal cannula and increase as needed.  Continuous oxygen support.    Essential hypertension Being treated at least temporarily by a prescription that was given to him by urgent care.  Patient very much needs a primary care doctor who can prescribe and manage his hypertension. Plan: Case management in the a.m. to help patient find a primary care doctor and set up a follow-up appointment.  Give amlodipine that was prescribed from urgent care.  Additional medications as needed.    Hilar lymphadenopathy Bilateral hilar lymphadenopathy on chest x-ray, which is apparently present on previous x-rays.  Radiologist strongly suspects sarcoidosis. Plan: Informed patient of the finding and the possibility of sarcoidosis.  Encouraged him to obtain a primary  care doctor and have further evaluation as an outpatient.    Cigarette nicotine dependence, uncomplicated Plan: Counseled to quit.    Obesity Patient is at high risk for obstructive sleep apnea given his obesity and large neck circumference.  Nurses did note that patient's oxygen level decreases while he is sleeping. Plan: Discussed with patient that he needs outpatient follow-up with her primary care doctor and that he should ask about a referral to obtain a sleep study to check for sleep apnea.     DVT prophylaxis: Lovenox.  Code Status:   Full Code   Disposition Plan:   Patient is from:  Home  Anticipated DC to:  Home  Anticipated DC date:  08/07/21   Anticipated DC barriers: Hypoxia  Consults called:  None  Admission status:  Inpatient   Severity of Illness: The appropriate patient status for this patient is INPATIENT. Inpatient status is judged to be reasonable and necessary in order to provide the required intensity of service to ensure the patient's safety. The patient's presenting symptoms, physical exam findings, and initial radiographic and laboratory data in the context of their chronic comorbidities is felt to place them at high risk for further clinical deterioration. Furthermore, it is not anticipated that the patient will be medically stable for discharge from the hospital within 2 midnights of admission.  Patient does not normally wear oxygen at home but is requiring continuous oxygen support of 4 L or more.  He is requiring IV remdesivir, IV steroids.  He is at risk of worsening.  * I certify that at the point of admission it is my clinical judgment that the patient will require inpatient hospital care spanning beyond 2 midnights from the point of admission due to high intensity of service, high risk for further deterioration and high frequency of surveillance required.Marlow Baars MD [facility]  How to contact the [facility] Attending or Consulting provider  7A - 7P or covering provider during after hours 7P -7A, for this patient?   Check the care team in [facility] and look for a) attending/consulting [facility] provider listed and b) the [facility] team listed Log into [email or web address] and use [place]'s universal password to access. If you do not have the password, please contact the hospital operator. Locate the [facility] provider you are looking for under [facility] and page to a number that you can be directly reached. If you still have difficulty reaching the provider, please page the [facility] (Director on Call) for the Hospitalists listed on amion for assistance.  07/25/2021, 12:31 PM

## 2021-07-25 NOTE — ED Provider Notes (Signed)
[facility] Emergency Department Provider Note  ____________________________________________   Event Date/Time   First MD Initiated Contact with Patient 07/25/21 [id number]     (approximate)  I have reviewed the triage vital signs and the nursing notes.   HISTORY  Chief Complaint Shortness of Breath   HPI Ryan Maxwell is a 35 y.o. male who had COVID in May of last year who noticed that he was short of breath yesterday and noticed he had lost his sense of smell and taste.  He took a home COVID test that was positive.  He comes in this morning because he is more short of breath.  O2 sats were in the high 80s and went up to the low 90s with 4 L of oxygen per nasal cannula.  Patient additionally reports he has right-sided pleuritic chest pain.        Past Medical History:  Diagnosis Date   Acute respiratory failure with hypoxia (HCC) 03/06/2020   Bronchitis    Hypertension    not taking medication at this time    Patient Active Problem List   Diagnosis Date Noted   Hypersomnolence 03/15/2020   COVID-19 virus infection 03/06/2020   Essential hypertension 03/06/2020    History reviewed. No pertinent surgical history.  Prior to Admission medications   Medication Sig Start Date End Date Taking? Authorizing Provider  albuterol (VENTOLIN HFA) 108 (90 Base) MCG/ACT inhaler Inhale 2 puffs into the lungs every 4 (four) hours. 03/11/20 04/10/20  Shahmehdi, Gemma Payor, MD  amLODipine (NORVASC) 5 MG tablet Take 1 tablet (5 mg total) by mouth daily. 07/18/21   Irean Hong, MD  amoxicillin-clavulanate (AUGMENTIN) 875-125 MG tablet Take 1 tablet by mouth 2 (two) times daily. 07/18/21   Irean Hong, MD  beclomethasone (QVAR) 40 MCG/ACT inhaler Inhale 2 puffs into the lungs daily as needed (for shortness of breath). 11/03/18   Willy Eddy, MD  dicyclomine (BENTYL) 20 MG tablet Take 1 tablet (20 mg total) by mouth 2 (two) times daily. 04/04/21   Mare Ferrari, PA-C   ipratropium (ATROVENT HFA) 17 MCG/ACT inhaler Inhale 2 puffs into the lungs every 4 (four) hours. 03/11/20   Shahmehdi, Gemma Payor, MD  ketorolac (TORADOL) 10 MG tablet Take 1 tablet (10 mg total) by mouth every 6 (six) hours as needed. 03/14/21   Joni Reining, PA-C  lisinopril (ZESTRIL) 5 MG tablet Take 1 tablet (5 mg total) by mouth daily. 03/11/20 04/10/20  Shahmehdi, Gemma Payor, MD  ondansetron (ZOFRAN ODT) 4 MG disintegrating tablet Take 1 tablet (4 mg total) by mouth every 8 (eight) hours as needed for nausea or vomiting. 04/04/21   Mare Ferrari, PA-C  orphenadrine (NORFLEX) 100 MG tablet Take 1 tablet (100 mg total) by mouth 2 (two) times daily. 03/14/21   Joni Reining, PA-C  oxyCODONE-acetaminophen (PERCOCET) 7.5-325 MG tablet Take 1 tablet by mouth every 4 (four) hours as needed for severe pain. 03/14/21 03/14/22  Joni Reining, PA-C    Allergies Dayquil [pseudoephedrine-apap-dm] and Nyquil multi-symptom [pseudoeph-doxylamine-dm-apap]  Family History  Problem Relation Age of Onset   Hypertension Other     Social History Social History   Tobacco Use   Smoking status: Every Day    Packs/day: 1.00    Types: Cigarettes   Smokeless tobacco: Never  Vaping Use   Vaping Use: Never used  Substance Use Topics   Alcohol use: Not Currently   Drug use: Yes    Types: Marijuana  Review of Systems  Constitutional: No fever/chills Eyes: No visual changes. ENT: No sore throat. Cardiovascular: chest pain. Respiratory: shortness of breath. Gastrointestinal: No abdominal pain.  No nausea, no vomiting.  No diarrhea.  No constipation. Genitourinary: Negative for dysuria. Musculoskeletal: Negative for back pain. Skin: Negative for rash. Neurological: Negative for headaches, focal weakness   ____________________________________________   PHYSICAL EXAM:  VITAL SIGNS: ED Triage Vitals  Enc Vitals Group     BP 07/25/21 0938 (!) 133/99     Pulse Rate 07/25/21 0938 (!) 106     Resp  07/25/21 0938 20     Temp 07/25/21 0938 98.7 F (37.1 C)     Temp Source 07/25/21 0938 Oral     SpO2 07/25/21 0938 97 %     Weight 07/25/21 0939 289 lb (131.1 kg)     Height 07/25/21 0939 6' (1.829 m)     Head Circumference --      Peak Flow --      Pain Score 07/25/21 0939 9     Pain Loc --      Pain Edu? --      Excl. in GC? --     Constitutional: Alert and oriented.  Looks ill uncomfortable and short of breath Eyes: Conjunctivae are normal.  Head: Atraumatic. Nose: No congestion/rhinnorhea. Mouth/Throat: Mucous membranes are moist.  Oropharynx non-erythematous. Neck: No stridor. Cardiovascular: Normal rate, regular rhythm. Grossly normal heart sounds.  Good peripheral circulation. Respiratory: Increased respiratory effort.  No retractions. Lungs CTAB. Gastrointestinal: Soft and nontender. No distention. No abdominal bruits.  Musculoskeletal: No lower extremity tenderness nor edema.   Neurologic:  Normal speech and language. No gross focal neurologic deficits are appreciated.  Skin:  Skin is warm, dry and intact. No rash noted.   ____________________________________________   LABS (all labs ordered are listed, but only abnormal results are displayed)  Labs Reviewed  BASIC METABOLIC PANEL - Abnormal; Notable for the following components:      Result Value   Glucose, Bld 104 (*)    Calcium 8.7 (*)    All other components within normal limits  CBC - Abnormal; Notable for the following components:   WBC 11.5 (*)    All other components within normal limits  TROPONIN I (HIGH SENSITIVITY) - Abnormal; Notable for the following components:   Troponin I (High Sensitivity) 55 (*)    All other components within normal limits  RESP PANEL BY RT-PCR (FLU A&B, COVID) ARPGX2  TROPONIN I (HIGH SENSITIVITY)   ____________________________________________  EKG  EKG read interpreted by me shows sinus tachycardia rate of 102 normal axis flipped T in V6 no change however from EKG from  03/07/2020 ____________________________________________  RADIOLOGY Jill Poling, personally viewed and evaluated these images (plain radiographs) as part of my medical decision making, as well as reviewing the written report by the radiologist.  ED MD interpretation: Chest x-ray reviewed by me does show some hilar adenopathy this appears to be present in previous chest x-ray although partially obscured by infiltrate.  Radiologist later ready reads the film and agrees.  Official radiology report(s): DG Chest 2 View  Result Date: 07/25/2021 CLINICAL DATA:  Shortness of breath.  Coronavirus infection. EXAM: CHEST - 2 VIEW COMPARISON:  07/18/2021.  CT 03/07/2020. FINDINGS: Heart size is normal. Chronic bilateral hilar lymphadenopathy. No sign of infiltrate, collapse or effusion. Bony structures are unremarkable. IMPRESSION: No pneumonia by radiography. Chronic bilateral hilar lymphadenopathy, present for many years. Question sarcoid. Electronically Signed  By: Paulina Fusi M.D.   On: 07/25/2021 10:38    ____________________________________________   PROCEDURES  Procedure(s) performed (including Critical Care): Critical care time 20 minutes.  This includes speaking with the patient reviewing his old records in chart review and reviewing his old chest x-ray.  I additionally reviewed his old EKG.  And then I will discussed the patient with the hospitalist.  Procedures   ____________________________________________   INITIAL IMPRESSION / ASSESSMENT AND PLAN / ED COURSE  As part of my medical decision making, I reviewed the following data within the electronic MEDICAL RECORD NUMBER  Please review the above critical care for medical decision making I should add that I did indeed review the labs that I ordered and a chest x-ray that I ordered.  The documentation of that is above.  Patient has a COVID with hypoxia.  He will need to come in the hospital for further treatment and of course oxygen  therapy.         ____________________________________________   FINAL CLINICAL IMPRESSION(S) / ED DIAGNOSES  Final diagnoses:  Hypoxia  Shortness of breath  COVID     ED Discharge Orders     None        Note:  This document was prepared using Dragon voice recognition software and may include unintentional dictation errors.    Arnaldo Natal, MD 07/25/21 1149

## 2021-07-25 NOTE — ED Notes (Signed)
Patient set up with lunch tray.

## 2021-07-26 DIAGNOSIS — R590 Localized enlarged lymph nodes: Secondary | ICD-10-CM

## 2021-07-26 LAB — COMPREHENSIVE METABOLIC PANEL
ALT: 32 U/L (ref 0–44)
AST: 19 U/L (ref 15–41)
Albumin: 3.7 g/dL (ref 3.5–5.0)
Alkaline Phosphatase: 60 U/L (ref 38–126)
Anion gap: 6 (ref 5–15)
BUN: 13 mg/dL (ref 6–20)
CO2: 29 mmol/L (ref 22–32)
Calcium: 8.9 mg/dL (ref 8.9–10.3)
Chloride: 102 mmol/L (ref 98–111)
Creatinine, Ser: 0.75 mg/dL (ref 0.61–1.24)
GFR, Estimated: >60 mL/min (ref >60)
Glucose, Bld: 122 mg/dL — ABNORMAL HIGH (ref 70–99)
Potassium: 4.9 mmol/L (ref 3.5–5.1)
Sodium: 137 mmol/L (ref 135–145)
Total Bilirubin: 0.7 mg/dL (ref 0.3–1.2)
Total Protein: 8.1 g/dL (ref 6.5–8.1)

## 2021-07-26 LAB — CBC
HCT: 49.3 % (ref 39.0–52.0)
Hemoglobin: 15.4 g/dL (ref 13.0–17.0)
MCH: 28.3 pg (ref 26.0–34.0)
MCHC: 31.2 g/dL (ref 30.0–36.0)
MCV: 90.5 fL (ref 80.0–100.0)
Platelets: 236 10*3/uL (ref 150–400)
RBC: 5.45 MIL/uL (ref 4.22–5.81)
RDW: 13.4 % (ref 11.5–15.5)
WBC: 8.8 10*3/uL (ref 4.0–10.5)
nRBC: 0 % (ref 0.0–0.2)

## 2021-07-26 LAB — HIV ANTIBODY (ROUTINE TESTING W REFLEX): HIV Screen 4th Generation wRfx: NONREACTIVE

## 2021-07-26 NOTE — Progress Notes (Signed)
PROGRESS NOTE    Ryan Maxwell  NID:[id number] DOB: 21-Nov-1985 DOA: 07/25/2021 PCP: Patient, No Pcp Per (Inactive)   Chief complaint.  Shortness of breath. Brief Narrative:  Ryan Maxwell is a 35 y.o. male with medical history significant for hypertension untreated until recently, history of COVID infection 2021, who presents to the emergency department on 07/25/2021 with shortness of breath.  He was tested positive for COVID at home using antigen test.  But PCR was negative.  Patient had a significant hypoxemia with ox saturation of 88%, he also had significant tachypnea.  He was placed on 4 L oxygen, started on steroids and remdesivir   Assessment & Plan:   Principal Problem:   Lower respiratory tract infection due to COVID-19 virus Active Problems:   Acute respiratory failure with hypoxia (HCC)   Essential hypertension   Hilar lymphadenopathy   Cigarette nicotine dependence, uncomplicated  Acute hypoxemic respiratory failure secondary to COVID-pneumonia. COVID-19 pneumonia. Patient had acute hypoxemic respiratory failure at the time admission, he had a significant short of breath, tachypnea, decreased oxygen saturation to 88%.  He has been requiring 4 L oxygen.  Currently on 3 L oxygen per We will continue IV steroids and remdesivir.  Essential hypertension. Continue home medicines.  Hilar lymphadenopathy. Possibility of sarcoidosis. Will refer him to pulmonology at time of discharge.  Obesity BMI 39.68.   DVT prophylaxis: Lovenox Code Status: full Family Communication:  Disposition Plan:    Status is: Inpatient  Remains inpatient appropriate because: Due to severity of disease, patient still have significant hypoxemia, requiring IV treatment.        No intake/output data recorded. No intake/output data recorded.     Consultants:  None  Procedures: None  Antimicrobials: None  Subjective: Still has significant short of breath with exertion.   No wheezing.  Still on 3 L oxygen today.  Cough, nonproductive. No fever chills No dysuria hematuria pain No chest pain palpitation. No abdominal pain or nausea vomiting. No Diarrhea.  Objective: Vitals:   07/25/21 1530 07/25/21 1627 07/25/21 2121 07/26/21 0832  BP: (!) 155/79 130/88 (!) 146/103 115/78  Pulse: (!) 102 94 95 85  Resp:  18 20   Temp:  98.9 F (37.2 C) 98.8 F (37.1 C) 98.1 F (36.7 C)  TempSrc:  Oral Oral Oral  SpO2:  97% 90% 95%  Weight:  132.7 kg    Height:  6' (1.829 m)      Intake/Output Summary (Last 24 hours) at 07/26/2021 1226 Last data filed at 07/25/2021 2100 Gross per 24 hour  Intake 0 ml  Output 0 ml  Net 0 ml   Filed Weights   07/25/21 0939 07/25/21 1627  Weight: 131.1 kg 132.7 kg    Examination:  General exam: Appears calm and comfortable  Respiratory system: Clear to auscultation. Respiratory effort normal. Cardiovascular system: S1 & S2 heard, RRR. No JVD, murmurs, rubs, gallops or clicks. No pedal edema. Gastrointestinal system: Abdomen is nondistended, soft and nontender. No organomegaly or masses felt. Normal bowel sounds heard. Central nervous system: Alert and oriented. No focal neurological deficits. Extremities: Symmetric 5 x 5 power. Skin: No rashes, lesions or ulcers Psychiatry: Judgement and insight appear normal. Mood & affect appropriate.     Data Reviewed: I have personally reviewed following labs and imaging studies  CBC: Recent Labs  Lab 07/25/21 0946 07/26/21 0414  WBC 11.5* 8.8  HGB 15.2 15.4  HCT 47.3 49.3  MCV 88.7 90.5  PLT 224 236  Basic Metabolic Panel: Recent Labs  Lab 07/25/21 0946 07/26/21 0414  NA 139 137  K 3.8 4.9  CL 98 102  CO2 32 29  GLUCOSE 104* 122*  BUN 10 13  CREATININE 0.88 0.75  CALCIUM 8.7* 8.9   GFR: Estimated Creatinine Clearance: 185 mL/min (by C-G formula based on SCr of 0.75 mg/dL). Liver Function Tests: Recent Labs  Lab 07/26/21 0414  AST 19  ALT 32  ALKPHOS 60   BILITOT 0.7  PROT 8.1  ALBUMIN 3.7   No results for input(s): LIPASE, AMYLASE in the last 168 hours. No results for input(s): AMMONIA in the last 168 hours. Coagulation Profile: No results for input(s): INR, PROTIME in the last 168 hours. Cardiac Enzymes: No results for input(s): CKTOTAL, CKMB, CKMBINDEX, TROPONINI in the last 168 hours. BNP (last 3 results) No results for input(s): PROBNP in the last 8760 hours. HbA1C: No results for input(s): HGBA1C in the last 72 hours. CBG: No results for input(s): GLUCAP in the last 168 hours. Lipid Profile: No results for input(s): CHOL, HDL, LDLCALC, TRIG, CHOLHDL, LDLDIRECT in the last 72 hours. Thyroid Function Tests: No results for input(s): TSH, T4TOTAL, FREET4, T3FREE, THYROIDAB in the last 72 hours. Anemia Panel: No results for input(s): VITAMINB12, FOLATE, FERRITIN, TIBC, IRON, RETICCTPCT in the last 72 hours. Sepsis Labs: No results for input(s): PROCALCITON, LATICACIDVEN in the last 168 hours.  Recent Results (from the past 240 hour(s))  Group A Strep by PCR     Status: None   Collection Time: 07/18/21 12:36 AM   Specimen: Throat; Sterile Swab  Result Value Ref Range Status   Group A Strep by PCR NOT DETECTED NOT DETECTED Final    Comment: Performed at [facility], [address] [place], [place] [address]  Resp Panel by RT-PCR (Flu A&B, Covid) Throat     Status: None   Collection Time: 07/18/21 12:36 AM   Specimen: Throat; Nasopharyngeal(NP) swabs in vial transport medium  Result Value Ref Range Status   SARS Coronavirus 2 by RT PCR NEGATIVE NEGATIVE Final    Comment: (NOTE) SARS-CoV-2 target nucleic acids are NOT DETECTED.  The SARS-CoV-2 RNA is generally detectable in upper respiratory specimens during the acute phase of infection. The lowest concentration of SARS-CoV-2 viral copies this assay can detect is 138 copies/mL. A negative result does not preclude SARS-Cov-2 infection and should not be used as the  sole basis for treatment or other patient management decisions. A negative result may occur with  improper specimen collection/handling, submission of specimen other than nasopharyngeal swab, presence of viral mutation(s) within the areas targeted by this assay, and inadequate number of viral copies(<138 copies/mL). A negative result must be combined with clinical observations, patient history, and epidemiological information. The expected result is Negative.  Fact Sheet for Patients:  BloggerCourse.com  Fact Sheet for Healthcare Providers:  SeriousBroker.it  This test is no t yet approved or cleared by the [place] FDA and  has been authorized for detection and/or diagnosis of SARS-CoV-2 by FDA under an Emergency Use Authorization (EUA). This EUA will remain  in effect (meaning this test can be used) for the duration of the COVID-19 declaration under Section 564(b)(1) of the Act, 21 U.S.C.section 360bbb-3(b)(1), unless the authorization is terminated  or revoked sooner.       Influenza A by PCR NEGATIVE NEGATIVE Final   Influenza B by PCR NEGATIVE NEGATIVE Final    Comment: (NOTE) The Xpert Xpress SARS-CoV-2/FLU/RSV plus assay is intended as  an aid in the diagnosis of influenza from Nasopharyngeal swab specimens and should not be used as a sole basis for treatment. Nasal washings and aspirates are unacceptable for Xpert Xpress SARS-CoV-2/FLU/RSV testing.  Fact Sheet for Patients: BloggerCourse.com  Fact Sheet for Healthcare Providers: SeriousBroker.it  This test is not yet approved or cleared by the [place] FDA and has been authorized for detection and/or diagnosis of SARS-CoV-2 by FDA under an Emergency Use Authorization (EUA). This EUA will remain in effect (meaning this test can be used) for the duration of the COVID-19 declaration under Section 564(b)(1) of the  Act, 21 U.S.C. section 360bbb-3(b)(1), unless the authorization is terminated or revoked.  Performed at [facility], [address] [place], [place] [address]   Resp Panel by RT-PCR (Flu A&B, Covid) Nasopharyngeal Swab     Status: None   Collection Time: 07/25/21 10:06 AM   Specimen: Nasopharyngeal Swab; Nasopharyngeal(NP) swabs in vial transport medium  Result Value Ref Range Status   SARS Coronavirus 2 by RT PCR NEGATIVE NEGATIVE Final    Comment: (NOTE) SARS-CoV-2 target nucleic acids are NOT DETECTED.  The SARS-CoV-2 RNA is generally detectable in upper respiratory specimens during the acute phase of infection. The lowest concentration of SARS-CoV-2 viral copies this assay can detect is 138 copies/mL. A negative result does not preclude SARS-Cov-2 infection and should not be used as the sole basis for treatment or other patient management decisions. A negative result may occur with  improper specimen collection/handling, submission of specimen other than nasopharyngeal swab, presence of viral mutation(s) within the areas targeted by this assay, and inadequate number of viral copies(<138 copies/mL). A negative result must be combined with clinical observations, patient history, and epidemiological information. The expected result is Negative.  Fact Sheet for Patients:  BloggerCourse.com  Fact Sheet for Healthcare Providers:  SeriousBroker.it  This test is no t yet approved or cleared by the [place] FDA and  has been authorized for detection and/or diagnosis of SARS-CoV-2 by FDA under an Emergency Use Authorization (EUA). This EUA will remain  in effect (meaning this test can be used) for the duration of the COVID-19 declaration under Section 564(b)(1) of the Act, 21 U.S.C.section 360bbb-3(b)(1), unless the authorization is terminated  or revoked sooner.       Influenza A by PCR NEGATIVE NEGATIVE Final    Influenza B by PCR NEGATIVE NEGATIVE Final    Comment: (NOTE) The Xpert Xpress SARS-CoV-2/FLU/RSV plus assay is intended as an aid in the diagnosis of influenza from Nasopharyngeal swab specimens and should not be used as a sole basis for treatment. Nasal washings and aspirates are unacceptable for Xpert Xpress SARS-CoV-2/FLU/RSV testing.  Fact Sheet for Patients: BloggerCourse.com  Fact Sheet for Healthcare Providers: SeriousBroker.it  This test is not yet approved or cleared by the [place] FDA and has been authorized for detection and/or diagnosis of SARS-CoV-2 by FDA under an Emergency Use Authorization (EUA). This EUA will remain in effect (meaning this test can be used) for the duration of the COVID-19 declaration under Section 564(b)(1) of the Act, 21 U.S.C. section 360bbb-3(b)(1), unless the authorization is terminated or revoked.  Performed at [facility], [address] [place], [place] [address]          Radiology Studies: DG Chest 2 View  Result Date: 07/25/2021 CLINICAL DATA:  Shortness of breath.  Coronavirus infection. EXAM: CHEST - 2 VIEW COMPARISON:  07/18/2021.  CT 03/07/2020. FINDINGS: Heart size is normal. Chronic bilateral hilar lymphadenopathy.  No sign of infiltrate, collapse or effusion. Bony structures are unremarkable. IMPRESSION: No pneumonia by radiography. Chronic bilateral hilar lymphadenopathy, present for many years. Question sarcoid. Electronically Signed   By: Paulina Fusi M.D.   On: 07/25/2021 10:38        Scheduled Meds:  amLODipine  5 mg Oral Daily   dexamethasone  6 mg Oral Q24H   enoxaparin (LOVENOX) injection  0.5 mg/kg Subcutaneous Q24H   lisinopril  5 mg Oral Daily   melatonin  5 mg Oral QHS   multivitamin with minerals  1 tablet Oral Daily   nicotine  14 mg Transdermal Daily   Continuous Infusions:  sodium chloride     remdesivir 100 mg in NS 100 mL 100 mg  (07/26/21 1010)     LOS: 1 day    Time spent: 32 minutes    Marrion Coy, MD [facility]   To contact the attending provider between 7A-7P or the covering provider during after hours 7P-7A, please log into the web site [email or web address] and access using universal [place] password for that web site. If you do not have the password, please call the hospital operator.  07/26/2021, 12:26 PM

## 2021-07-27 LAB — CBC WITH DIFFERENTIAL/PLATELET
Abs Immature Granulocytes: 0.15 10*3/uL — ABNORMAL HIGH (ref 0.00–0.07)
Basophils Absolute: 0 10*3/uL (ref 0.0–0.1)
Basophils Relative: 0 %
Eosinophils Absolute: 0 10*3/uL (ref 0.0–0.5)
Eosinophils Relative: 0 %
HCT: 49.7 % (ref 39.0–52.0)
Hemoglobin: 15.1 g/dL (ref 13.0–17.0)
Immature Granulocytes: 1 %
Lymphocytes Relative: 11 %
Lymphs Abs: 1.5 10*3/uL (ref 0.7–4.0)
MCH: 27.5 pg (ref 26.0–34.0)
MCHC: 30.4 g/dL (ref 30.0–36.0)
MCV: 90.4 fL (ref 80.0–100.0)
Monocytes Absolute: 1.6 10*3/uL — ABNORMAL HIGH (ref 0.1–1.0)
Monocytes Relative: 11 %
Neutro Abs: 10.6 10*3/uL — ABNORMAL HIGH (ref 1.7–7.7)
Neutrophils Relative %: 77 %
Platelets: 274 10*3/uL (ref 150–400)
RBC: 5.5 MIL/uL (ref 4.22–5.81)
RDW: 13 % (ref 11.5–15.5)
WBC: 13.9 10*3/uL — ABNORMAL HIGH (ref 4.0–10.5)
nRBC: 0 % (ref 0.0–0.2)

## 2021-07-27 LAB — MAGNESIUM: Magnesium: 2.4 mg/dL (ref 1.7–2.4)

## 2021-07-27 LAB — COMPREHENSIVE METABOLIC PANEL
ALT: 30 U/L (ref 0–44)
AST: 17 U/L (ref 15–41)
Albumin: 3.5 g/dL (ref 3.5–5.0)
Alkaline Phosphatase: 54 U/L (ref 38–126)
Anion gap: 6 (ref 5–15)
BUN: 12 mg/dL (ref 6–20)
CO2: 30 mmol/L (ref 22–32)
Calcium: 9 mg/dL (ref 8.9–10.3)
Chloride: 103 mmol/L (ref 98–111)
Creatinine, Ser: 0.65 mg/dL (ref 0.61–1.24)
GFR, Estimated: >60 mL/min (ref >60)
Glucose, Bld: 142 mg/dL — ABNORMAL HIGH (ref 70–99)
Potassium: 4.5 mmol/L (ref 3.5–5.1)
Sodium: 139 mmol/L (ref 135–145)
Total Bilirubin: 0.4 mg/dL (ref 0.3–1.2)
Total Protein: 7.8 g/dL (ref 6.5–8.1)

## 2021-07-27 LAB — C-REACTIVE PROTEIN: CRP: 6.4 mg/dL — ABNORMAL HIGH (ref <1.0)

## 2021-07-27 LAB — PHOSPHORUS: Phosphorus: 4.1 mg/dL (ref 2.5–4.6)

## 2021-07-27 NOTE — Progress Notes (Signed)
Patietn using IS  and is weaned to room air.  Patient verbalized  understanding to call with shortness of breath or other concerns.  95% on room air.

## 2021-07-27 NOTE — TOC Initial Note (Signed)
Transition of Care [facility]) - Initial/Assessment Note    Patient Details  Name: MARTAVIUS LUSTY MRN: [id number] Date of Birth: 1986-08-09  Transition of [facility] CM/SW Contact:    Chapman Fitch, RN Phone Number: 07/27/2021, 11:02 AM  Clinical Narrative:                 Patient admitted from home with Acute hypoxemic respiratory failure secondary to COVID-pneumonia Patient states that he lives at home with his fiance and child  Pcp - None.  Patient agreeable to be set up at one of the local [facility] Pharmacy - [facility]   Patient uninsured.  Please consult TOC for medication needs  Patient currently on acute O2.  RN to attempt to wean  New patient appointment at [facility] health made for 12/19 information added to AVS  Expected Discharge Plan: Home/Self Care Barriers to Discharge: Continued Medical Work up   Patient Goals and CMS Choice        Expected Discharge Plan and Services Expected Discharge Plan: Home/Self Care   Discharge Planning Services: CM Consult   Living arrangements for the past 2 months: Apartment                                      Prior Living Arrangements/Services Living arrangements for the past 2 months: Apartment Lives with:: Significant Other Patient language and need for interpreter reviewed:: Yes Do you feel safe going back to the place where you live?: Yes      Need for Family Participation in Patient Care: Yes (Comment) Care giver support system in place?: Yes (comment)   Criminal Activity/Legal Involvement Pertinent to Current Situation/Hospitalization: No - Comment as needed  Activities of Daily Living Home Assistive Devices/Equipment: None ADL Screening (condition at time of admission) Patient's cognitive ability adequate to safely complete daily activities?: Yes Is the patient deaf or have difficulty hearing?: No Does the patient have difficulty seeing, even when wearing glasses/contacts?:  No Does the patient have difficulty concentrating, remembering, or making decisions?: No Patient able to express need for assistance with ADLs?: Yes Does the patient have difficulty dressing or bathing?: No Independently performs ADLs?: Yes (appropriate for developmental age) Does the patient have difficulty walking or climbing stairs?: No Weakness of Legs: None Weakness of Arms/Hands: None  Permission Sought/Granted                  Emotional Assessment       Orientation: : Oriented to Self, Oriented to Place, Oriented to  Time, Oriented to Situation Alcohol / Substance Use: Not Applicable Psych Involvement: No (comment)  Admission diagnosis:  Shortness of breath [R06.02] Hypoxia [R09.02] COVID [U07.1] Pneumonia due to COVID-19 virus [U07.1, J12.82] Patient Active Problem List   Diagnosis Date Noted   Lower respiratory tract infection due to COVID-19 virus 07/25/2021   Hilar lymphadenopathy 07/25/2021   Cigarette nicotine dependence, uncomplicated 07/25/2021   Hypersomnolence 03/15/2020   COVID-19 virus infection 03/06/2020   Acute respiratory failure with hypoxia (HCC) 03/06/2020   Essential hypertension 03/06/2020   PCP:  Patient, No Pcp Per (Inactive) Pharmacy:   [facility] [address], [place] - [address] [place] [address] Phone: [phone] Fax: [phone]  [facility] [address] [place] - [address] [place] [address] Phone: [phone] Fax: [phone]     Social Determinants of  Health (SDOH) Interventions    Readmission Risk Interventions No flowsheet data found.

## 2021-07-27 NOTE — Progress Notes (Signed)
PROGRESS NOTE    Ryan Maxwell  RJJ:[id number] DOB: 09-May-1986 DOA: 07/25/2021 PCP: Patient, No Pcp Per (Inactive)   Chief complaint.  Shortness of breath. Brief Narrative:  Ryan Maxwell is a 35 y.o. male with medical history significant for hypertension untreated until recently, history of COVID infection 2021, who presents to the emergency department on 07/25/2021 with shortness of breath.  He was tested positive for COVID at home using antigen test.  But PCR was negative.  Patient had a significant hypoxemia with ox saturation of 88%, he also had significant tachypnea.  He was placed on 4 L oxygen, started on steroids and remdesivir   Assessment & Plan:   Principal Problem:   Lower respiratory tract infection due to COVID-19 virus Active Problems:   Acute respiratory failure with hypoxia (HCC)   Essential hypertension   Hilar lymphadenopathy   Cigarette nicotine dependence, uncomplicated  Acute hypoxemic respiratory failure secondary to COVID-pneumonia. COVID-19 pneumonia. Patient continues to improve, I have weaned down patient oxygen to 1 L today, continue IV steroids and remdesivir. Continue wean off oxygen.  Most likely will discharge home tomorrow.   Essential hypertension. Continue home medicines.  Hilar lymphadenopathy. Possibility of sarcoidosis. Will refer him to pulmonology at time of discharge.   Obesity BMI 39.68.  DVT prophylaxis: Lovenox Code Status: full Family Communication:  Disposition Plan:      Status is: Inpatient   Remains inpatient appropriate because: Due to severity of disease, patient still have significant hypoxemia, requiring IV treatment.            I/O last 3 completed shifts: In: 218 [P.O.:118; IV Piggyback:100] Out: 1150 [Urine:1150] No intake/output data recorded.     Consultants:  None   Procedures: None   Antimicrobials: None     Subjective: Patient continues to make improvement.  Able to wean down  oxygen to 1 L, short of breath better, cough, nonproductive. No fever or chills. No dysuria or hematuria. No abdominal pain or nausea vomiting, no diarrhea constipation.  Objective: Vitals:   07/26/21 2037 07/27/21 0345 07/27/21 0546 07/27/21 0824  BP: (!) 154/92  134/89 (!) 139/97  Pulse: 89  77 87  Resp: (!) 22  20 16   Temp: 98.7 F (37.1 C)  98.4 F (36.9 C) 98.6 F (37 C)  TempSrc:   Oral Oral  SpO2: 93%  98% 96%  Weight:  134.3 kg    Height:        Intake/Output Summary (Last 24 hours) at 07/27/2021 1239 Last data filed at 07/27/2021 0549 Gross per 24 hour  Intake 218 ml  Output 1150 ml  Net -932 ml   Filed Weights   07/25/21 0939 07/25/21 1627 07/27/21 0345  Weight: 131.1 kg 132.7 kg 134.3 kg    Examination:  General exam: Appears calm and comfortable  Respiratory system: Clear to auscultation. Respiratory effort normal. Cardiovascular system: S1 & S2 heard, RRR. No JVD, murmurs, rubs, gallops or clicks. No pedal edema. Gastrointestinal system: Abdomen is nondistended, soft and nontender. No organomegaly or masses felt. Normal bowel sounds heard. Central nervous system: Alert and oriented. No focal neurological deficits. Extremities: Symmetric 5 x 5 power. Skin: No rashes, lesions or ulcers Psychiatry: Judgement and insight appear normal. Mood & affect appropriate.     Data Reviewed: I have personally reviewed following labs and imaging studies  CBC: Recent Labs  Lab 07/25/21 0946 07/26/21 0414 07/27/21 0538  WBC 11.5* 8.8 13.9*  NEUTROABS  --   --  10.6*  HGB 15.2 15.4 15.1  HCT 47.3 49.3 49.7  MCV 88.7 90.5 90.4  PLT 224 236 274   Basic Metabolic Panel: Recent Labs  Lab 07/25/21 0946 07/26/21 0414 07/27/21 0538  NA 139 137 139  K 3.8 4.9 4.5  CL 98 102 103  CO2 32 29 30  GLUCOSE 104* 122* 142*  BUN 10 13 12   CREATININE 0.88 0.75 0.65  CALCIUM 8.7* 8.9 9.0  MG  --   --  2.4  PHOS  --   --  4.1   GFR: Estimated Creatinine Clearance:  186.3 mL/min (by C-G formula based on SCr of 0.65 mg/dL). Liver Function Tests: Recent Labs  Lab 07/26/21 0414 07/27/21 0538  AST 19 17  ALT 32 30  ALKPHOS 60 54  BILITOT 0.7 0.4  PROT 8.1 7.8  ALBUMIN 3.7 3.5   No results for input(s): LIPASE, AMYLASE in the last 168 hours. No results for input(s): AMMONIA in the last 168 hours. Coagulation Profile: No results for input(s): INR, PROTIME in the last 168 hours. Cardiac Enzymes: No results for input(s): CKTOTAL, CKMB, CKMBINDEX, TROPONINI in the last 168 hours. BNP (last 3 results) No results for input(s): PROBNP in the last 8760 hours. HbA1C: No results for input(s): HGBA1C in the last 72 hours. CBG: No results for input(s): GLUCAP in the last 168 hours. Lipid Profile: No results for input(s): CHOL, HDL, LDLCALC, TRIG, CHOLHDL, LDLDIRECT in the last 72 hours. Thyroid Function Tests: No results for input(s): TSH, T4TOTAL, FREET4, T3FREE, THYROIDAB in the last 72 hours. Anemia Panel: No results for input(s): VITAMINB12, FOLATE, FERRITIN, TIBC, IRON, RETICCTPCT in the last 72 hours. Sepsis Labs: No results for input(s): PROCALCITON, LATICACIDVEN in the last 168 hours.  Recent Results (from the past 240 hour(s))  Group A Strep by PCR     Status: None   Collection Time: 07/18/21 12:36 AM   Specimen: Throat; Sterile Swab  Result Value Ref Range Status   Group A Strep by PCR NOT DETECTED NOT DETECTED Final    Comment: Performed at [facility], [address] [place], [place]  Resp Panel by RT-PCR (Flu A&B, Covid) Throat     Status: None   Collection Time: 07/18/21 12:36 AM   Specimen: Throat; Nasopharyngeal(NP) swabs in vial transport medium  Result Value Ref Range Status   SARS Coronavirus 2 by RT PCR NEGATIVE NEGATIVE Final    Comment: (NOTE) SARS-CoV-2 target nucleic acids are NOT DETECTED.  The SARS-CoV-2 RNA is generally detectable in upper respiratory specimens during the acute phase of infection.  The lowest concentration of SARS-CoV-2 viral copies this assay can detect is 138 copies/mL. A negative result does not preclude SARS-Cov-2 infection and should not be used as the sole basis for treatment or other patient management decisions. A negative result may occur with  improper specimen collection/handling, submission of specimen other than nasopharyngeal swab, presence of viral mutation(s) within the areas targeted by this assay, and inadequate number of viral copies(<138 copies/mL). A negative result must be combined with clinical observations, patient history, and epidemiological information. The expected result is Negative.  Fact Sheet for Patients:  09/17/21  Fact Sheet for Healthcare Providers:  BloggerCourse.com  This test is no t yet approved or cleared by the [place] FDA and  has been authorized for detection and/or diagnosis of SARS-CoV-2 by FDA under an Emergency Use Authorization (EUA). This EUA will remain  in effect (meaning this test can be used) for the duration of  the COVID-19 declaration under Section 564(b)(1) of the Act, 21 U.S.C.section 360bbb-3(b)(1), unless the authorization is terminated  or revoked sooner.       Influenza A by PCR NEGATIVE NEGATIVE Final   Influenza B by PCR NEGATIVE NEGATIVE Final    Comment: (NOTE) The Xpert Xpress SARS-CoV-2/FLU/RSV plus assay is intended as an aid in the diagnosis of influenza from Nasopharyngeal swab specimens and should not be used as a sole basis for treatment. Nasal washings and aspirates are unacceptable for Xpert Xpress SARS-CoV-2/FLU/RSV testing.  Fact Sheet for Patients: BloggerCourse.com  Fact Sheet for Healthcare Providers: Serious[employer]ker.it  This test is not yet approved or cleared by the [place] FDA and has been authorized for detection and/or diagnosis of SARS-CoV-2 by FDA  under an Emergency Use Authorization (EUA). This EUA will remain in effect (meaning this test can be used) for the duration of the COVID-19 declaration under Section 564(b)(1) of the Act, 21 U.S.C. section 360bbb-3(b)(1), unless the authorization is terminated or revoked.  Performed at [facility], [address] [place], [place] [address]   Resp Panel by RT-PCR (Flu A&B, Covid) Nasopharyngeal Swab     Status: None   Collection Time: 07/25/21 10:06 AM   Specimen: Nasopharyngeal Swab; Nasopharyngeal(NP) swabs in vial transport medium  Result Value Ref Range Status   SARS Coronavirus 2 by RT PCR NEGATIVE NEGATIVE Final    Comment: (NOTE) SARS-CoV-2 target nucleic acids are NOT DETECTED.  The SARS-CoV-2 RNA is generally detectable in upper respiratory specimens during the acute phase of infection. The lowest concentration of SARS-CoV-2 viral copies this assay can detect is 138 copies/mL. A negative result does not preclude SARS-Cov-2 infection and should not be used as the sole basis for treatment or other patient management decisions. A negative result may occur with  improper specimen collection/handling, submission of specimen other than nasopharyngeal swab, presence of viral mutation(s) within the areas targeted by this assay, and inadequate number of viral copies(<138 copies/mL). A negative result must be combined with clinical observations, patient history, and epidemiological information. The expected result is Negative.  Fact Sheet for Patients:  BloggerCourse.com  Fact Sheet for Healthcare Providers:  [employer]ious[employer]ker[employer]it  This test is no t yet approved or cleared by the [place] FDA and  has been authorized for detection and/or diagnosis of SARS-CoV-2 by FDA under an Emergency Use Authorization (EUA). This EUA will remain  in effect (meaning this test can be used) for the duration of the COVID-19 declaration  under Section 564(b)(1) of the Act, 21 U.S.C.section 360bbb-3(b)(1), unless the authorization is terminated  or revoked sooner.       Influenza A by PCR NEGATIVE NEGATIVE Final   Influenza B by PCR NEGATIVE NEGATIVE Final    Comment: (NOTE) The Xpert Xpress SARS-CoV-2/FLU/RSV plus assay is intended as an aid in the diagnosis of influenza from Nasopharyngeal swab specimens and should not be used as a sole basis for treatment. Nasal washings and aspirates are unacceptable for Xpert Xpress SARS-CoV-2/FLU/RSV testing.  Fact Sheet for Patients: BloggerCourse.com  Fact Sheet for Healthcare Providers: [employer]iousBroker.it  This test is not yet approved or cleared by the [place] FDA and has been authorized for detection and/or diagnosis of SARS-CoV-2 by FDA under an Emergency Use Authorization (EUA). This EUA will remain in effect (meaning this test can be used) for the duration of the COVID-19 declaration under Section 564(b)(1) of the Act, 21 U.S.C. section 360bbb-3(b)(1), unless the authorization is terminated or revoked.  Performed at [facility], [address] [place], [place] [address]          Radiology Studies: No results found.      Scheduled Meds:  amLODipine  5 mg Oral Daily   dexamethasone  6 mg Oral Q24H   enoxaparin (LOVENOX) injection  0.5 mg/kg Subcutaneous Q24H   lisinopril  5 mg Oral Daily   melatonin  5 mg Oral QHS   multivitamin with minerals  1 tablet Oral Daily   nicotine  14 mg Transdermal Daily   Continuous Infusions:  sodium chloride     remdesivir 100 mg in NS 100 mL 100 mg (07/27/21 1038)     LOS: 2 days    Time spent: 27 minutes    Marrion Coy, MD [facility]   To contact the attending provider between 7A-7P or the covering provider during after hours 7P-7A, please log into the web site [email or web address] and access using universal [place] password for that  web site. If you do not have the password, please call the hospital operator.  07/27/2021, 12:39 PM

## 2021-07-27 NOTE — Progress Notes (Signed)
Mobility Specialist - Progress Note   07/27/21 1100  Mobility  Activity Ambulated in room  Level of Assistance Standby assist, set-up cues, supervision of patient - no hands on  Assistive Device None  Distance Ambulated (ft) 100 ft  Mobility Ambulated with assistance in hallway  Mobility Response Tolerated well  Mobility performed by Mobility specialist  $Mobility charge 1 Mobility    Pre-mobility: 82 HR, 92% SpO2 During mobility: 102 HR, 84-90% SpO2 Post-mobility: 94 HR, 100% SpO2   Pt lying in bed upon arrival, utilizing .5L. Pt able to exit bed without assist. Ambulated in room initially on RA, O2 desat to 84% after ~60'. Denied SOB. O2 reapplied for remainder of session with sats increasing > 96% on .5L. LOB noted x1 able to self-correct. Pt returned to bed with needs in reach.    Filiberto Pinks Mobility Specialist 07/27/21, 11:16 AM

## 2021-07-28 DIAGNOSIS — I10 Essential (primary) hypertension: Secondary | ICD-10-CM

## 2021-07-28 MED ORDER — DEXAMETHASONE 6 MG PO TABS: 6.0000 mg | ORAL_TABLET | ORAL | 0 refills | Status: AC

## 2021-07-28 NOTE — TOC Transition Note (Signed)
Transition of Care [facility]) - CM/SW Discharge Note   Patient Details  Name: Ryan Maxwell MRN: [id number] Date of Birth: 18-Feb-1986  [facility] CM/SW Contact:  Chapman Fitch, RN Phone Number: 07/28/2021, 10:41 AM   Clinical Narrative:      Notified MD that the soonest I was able to get patient an appointment was 12/19   Barriers to Discharge: Continued Medical Work up   Patient Goals and CMS Choice        Discharge Placement                       Discharge Plan and Services   Discharge Planning Services: CM Consult                                 Social Determinants of Health (SDOH) Interventions     Readmission Risk Interventions No flowsheet data found.

## 2021-07-28 NOTE — Discharge Summary (Signed)
Physician Discharge Summary  Patient ID: Ryan Maxwell MRN: [id number] DOB/AGE: [birth year] 34 y.o.  Admit date: 07/25/2021 Discharge date: 07/28/2021  Admission Diagnoses:  Discharge Diagnoses:  Principal Problem:   Lower respiratory tract infection due to COVID-19 virus Active Problems:   Acute respiratory failure with hypoxia (HCC)   Essential hypertension   Hilar lymphadenopathy   Cigarette nicotine dependence, uncomplicated   Discharged Condition: good  Hospital Course:  Ryan Maxwell is a 35 y.o. male with medical history significant for hypertension untreated until recently, history of COVID infection 2021, who presents to the emergency department on 07/25/2021 with shortness of breath.  He was tested positive for COVID at home using antigen test.  But PCR was negative.  Patient had a significant hypoxemia with ox saturation of 88%, he also had significant tachypnea.  He was placed on 4 L oxygen, started on steroids and remdesivir.  Acute hypoxemic respiratory failure secondary to COVID-pneumonia. COVID-19 pneumonia. Condition had improved, currently off oxygen.  Short of breath that resolved.  At this time, he is medically stable to be discharged.  I have asked social worker to arrange for PCP follow-up within 1 week.  Patient is also advised to quarantine for 10 days.     Essential hypertension. Continue home medicines.  Hilar lymphadenopathy. Possibility of sarcoidosis. Will refer him to pulmonology.   Obesity BMI 39.68.   Consults: None  Significant Diagnostic Studies:   Treatments: Steroids and remdesivir.  Discharge Exam: Blood pressure (!) 143/96, pulse 85, temperature 98.5 F (36.9 C), temperature source Oral, resp. rate 20, height 6' (1.829 m), weight 134.3 kg, SpO2 96 %. General appearance: alert and cooperative Resp: clear to auscultation bilaterally Cardio: regular rate and rhythm, S1, S2 normal, no murmur, click, rub or gallop GI: soft,  non-tender; bowel sounds normal; no masses,  no organomegaly Extremities: extremities normal, atraumatic, no cyanosis or edema  Disposition: Discharge disposition: 01-Home or Self Care       Discharge Instructions     Diet - low sodium heart healthy   Complete by: As directed    Increase activity slowly   Complete by: As directed       Allergies as of 07/28/2021       Reactions   Dayquil [pseudoephedrine-apap-dm] Shortness Of Breath   Nyquil Multi-symptom [pseudoeph-doxylamine-dm-apap] Shortness Of Breath        Medication List     STOP taking these medications    amoxicillin-clavulanate 875-125 MG tablet Commonly known as: Augmentin   beclomethasone 40 MCG/ACT inhaler Commonly known as: QVAR   dicyclomine 20 MG tablet Commonly known as: BENTYL   ipratropium 17 MCG/ACT inhaler Commonly known as: ATROVENT HFA   ondansetron 4 MG disintegrating tablet Commonly known as: Zofran ODT   orphenadrine 100 MG tablet Commonly known as: NORFLEX   oxyCODONE-acetaminophen 7.5-325 MG tablet Commonly known as: Percocet       TAKE these medications    albuterol 108 (90 Base) MCG/ACT inhaler Commonly known as: VENTOLIN HFA Inhale 2 puffs into the lungs every 4 (four) hours.   amLODipine 5 MG tablet Commonly known as: NORVASC Take 1 tablet (5 mg total) by mouth daily.   dexamethasone 6 MG tablet Commonly known as: DECADRON Take 1 tablet (6 mg total) by mouth daily for 6 days.   ketorolac 10 MG tablet Commonly known as: TORADOL Take 1 tablet (10 mg total) by mouth every 6 (six) hours as needed.   lisinopril 5 MG tablet Commonly known as: ZESTRIL Take  1 tablet (5 mg total) by mouth daily.        Follow-up Information     Center, [facility]. Go in 59 day(s).   Why: arrive ar 1245 bring id, proof of income, proof of address, current medications, paper work from hospital Contact information: [address] [place]  [address] [phone]                32 minutes Signed: Marrion Coy 07/28/2021, 10:16 AM

## 2021-11-21 ENCOUNTER — Emergency Department: Payer: Self-pay

## 2021-11-21 ENCOUNTER — Encounter: Payer: Self-pay | Admitting: Internal Medicine

## 2021-11-21 ENCOUNTER — Observation Stay
Admission: EM | Admit: 2021-11-21 | Discharge: 2021-11-22 | Disposition: A | Discharge status: Home / Self Care | Payer: Managed Care, Other (non HMO) | Attending: Student in an Organized Health Care Education/Training Program | Admitting: Student in an Organized Health Care Education/Training Program

## 2021-11-21 ENCOUNTER — Other Ambulatory Visit: Payer: Self-pay

## 2021-11-21 DIAGNOSIS — J209 Acute bronchitis, unspecified: Secondary | ICD-10-CM | POA: Diagnosis not present

## 2021-11-21 DIAGNOSIS — Z79899 Other long term (current) drug therapy: Secondary | ICD-10-CM | POA: Insufficient documentation

## 2021-11-21 DIAGNOSIS — J9601 Acute respiratory failure with hypoxia: Secondary | ICD-10-CM | POA: Diagnosis not present

## 2021-11-21 DIAGNOSIS — J206 Acute bronchitis due to rhinovirus: Secondary | ICD-10-CM

## 2021-11-21 DIAGNOSIS — B9789 Other viral agents as the cause of diseases classified elsewhere: Secondary | ICD-10-CM | POA: Insufficient documentation

## 2021-11-21 DIAGNOSIS — I10 Essential (primary) hypertension: Secondary | ICD-10-CM | POA: Insufficient documentation

## 2021-11-21 DIAGNOSIS — Z720 Tobacco use: Secondary | ICD-10-CM | POA: Diagnosis not present

## 2021-11-21 DIAGNOSIS — J9602 Acute respiratory failure with hypercapnia: Secondary | ICD-10-CM | POA: Diagnosis present

## 2021-11-21 DIAGNOSIS — R079 Chest pain, unspecified: Secondary | ICD-10-CM | POA: Diagnosis present

## 2021-11-21 DIAGNOSIS — F17210 Nicotine dependence, cigarettes, uncomplicated: Secondary | ICD-10-CM | POA: Insufficient documentation

## 2021-11-21 DIAGNOSIS — Z20822 Contact with and (suspected) exposure to covid-19: Secondary | ICD-10-CM | POA: Insufficient documentation

## 2021-11-21 DIAGNOSIS — J40 Bronchitis, not specified as acute or chronic: Secondary | ICD-10-CM

## 2021-11-21 HISTORY — DX: Acute bronchitis, unspecified: J20.9

## 2021-11-21 HISTORY — DX: Pneumonia, unspecified organism: J18.9

## 2021-11-21 HISTORY — DX: Tobacco use: Z72.0

## 2021-11-21 LAB — HEPATIC FUNCTION PANEL
ALT: 33 U/L (ref 0–44)
AST: 26 U/L (ref 15–41)
Albumin: 4 g/dL (ref 3.5–5.0)
Alkaline Phosphatase: 63 U/L (ref 38–126)
Bilirubin, Direct: <0.1 mg/dL (ref 0.0–0.2)
Total Bilirubin: 0.6 mg/dL (ref 0.3–1.2)
Total Protein: 7.8 g/dL (ref 6.5–8.1)

## 2021-11-21 LAB — BASIC METABOLIC PANEL
Anion gap: 6 (ref 5–15)
BUN: 16 mg/dL (ref 6–20)
CO2: 29 mmol/L (ref 22–32)
Calcium: 8.8 mg/dL — ABNORMAL LOW (ref 8.9–10.3)
Chloride: 105 mmol/L (ref 98–111)
Creatinine, Ser: 0.77 mg/dL (ref 0.61–1.24)
GFR, Estimated: >60 mL/min (ref >60)
Glucose, Bld: 104 mg/dL — ABNORMAL HIGH (ref 70–99)
Potassium: 3.8 mmol/L (ref 3.5–5.1)
Sodium: 140 mmol/L (ref 135–145)

## 2021-11-21 LAB — D-DIMER, QUANTITATIVE: D-Dimer, Quant: 0.44 ug/mL-FEU (ref 0.00–0.50)

## 2021-11-21 LAB — RESPIRATORY PANEL BY PCR

## 2021-11-21 LAB — TROPONIN I (HIGH SENSITIVITY)
Troponin I (High Sensitivity): 12 ng/L (ref <18)
Troponin I (High Sensitivity): 11 ng/L (ref <18)

## 2021-11-21 LAB — CBC
HCT: 49.7 % (ref 39.0–52.0)
Hemoglobin: 15.2 g/dL (ref 13.0–17.0)
MCH: 27.5 pg (ref 26.0–34.0)
MCHC: 30.6 g/dL (ref 30.0–36.0)
MCV: 89.9 fL (ref 80.0–100.0)
Platelets: 221 10*3/uL (ref 150–400)
RBC: 5.53 MIL/uL (ref 4.22–5.81)
RDW: 13.5 % (ref 11.5–15.5)
WBC: 8.9 10*3/uL (ref 4.0–10.5)
nRBC: 0 % (ref 0.0–0.2)

## 2021-11-21 LAB — LIPASE, BLOOD: Lipase: 25 U/L (ref 11–51)

## 2021-11-21 LAB — GLUCOSE, CAPILLARY: Glucose-Capillary: 169 mg/dL — ABNORMAL HIGH (ref 70–99)

## 2021-11-21 LAB — EXPECTORATED SPUTUM ASSESSMENT W GRAM STAIN, RFLX TO RESP C

## 2021-11-21 LAB — RESP PANEL BY RT-PCR (FLU A&B, COVID) ARPGX2
Influenza A by PCR: NEGATIVE
Influenza B by PCR: NEGATIVE
SARS Coronavirus 2 by RT PCR: NEGATIVE

## 2021-11-21 MED ORDER — IPRATROPIUM-ALBUTEROL 0.5-2.5 (3) MG/3ML IN SOLN
3.0000 mL | Freq: Once | RESPIRATORY_TRACT | Status: AC
  Administered 2021-11-21: 3 mL via RESPIRATORY_TRACT
  Filled 2021-11-21: qty 3

## 2021-11-21 MED ORDER — PREDNISONE 20 MG PO TABS: 40.0000 mg | ORAL_TABLET | Freq: Every day | ORAL | 0 refills | Status: AC

## 2021-11-21 MED ORDER — AMLODIPINE BESYLATE 5 MG PO TABS
5.0000 mg | ORAL_TABLET | Freq: Every day | ORAL | Status: DC
  Administered 2021-11-21 – 2021-11-22 (×2): 5 mg via ORAL
  Filled 2021-11-21 (×2): qty 1

## 2021-11-21 MED ORDER — ACETAMINOPHEN 325 MG PO TABS: 650.0000 mg | ORAL_TABLET | Freq: Four times a day (QID) | ORAL | Status: DC | PRN

## 2021-11-21 MED ORDER — ACETAMINOPHEN 500 MG PO TABS
1000.0000 mg | ORAL_TABLET | Freq: Once | ORAL | Status: AC
  Administered 2021-11-21: 1000 mg via ORAL
  Filled 2021-11-21: qty 2

## 2021-11-21 MED ORDER — HYDRALAZINE HCL 20 MG/ML IJ SOLN: 5.0000 mg | INTRAMUSCULAR | Status: DC | PRN

## 2021-11-21 MED ORDER — AZITHROMYCIN 500 MG PO TABS
250.0000 mg | ORAL_TABLET | Freq: Every day | ORAL | Status: DC
  Administered 2021-11-22: 09:00:00 250 mg via ORAL
  Filled 2021-11-21: qty 1

## 2021-11-21 MED ORDER — HYDRALAZINE HCL 20 MG/ML IJ SOLN
5.0000 mg | INTRAMUSCULAR | Status: DC | PRN
  Administered 2021-11-21: 17:00:00 5 mg via INTRAVENOUS
  Filled 2021-11-21: qty 1

## 2021-11-21 MED ORDER — GUAIFENESIN 100 MG/5ML PO LIQD
5.0000 mL | ORAL | Status: DC | PRN
  Administered 2021-11-22: 04:00:00 5 mL via ORAL
  Filled 2021-11-21: qty 10

## 2021-11-21 MED ORDER — INFLUENZA VAC SPLIT QUAD 0.5 ML IM SUSY: 0.5000 mL | PREFILLED_SYRINGE | INTRAMUSCULAR | Status: DC

## 2021-11-21 MED ORDER — IPRATROPIUM-ALBUTEROL 0.5-2.5 (3) MG/3ML IN SOLN
3.0000 mL | Freq: Once | RESPIRATORY_TRACT | Status: AC
Start: 2021-11-21 — End: 2021-11-21
  Administered 2021-11-21: 3 mL via RESPIRATORY_TRACT

## 2021-11-21 MED ORDER — MORPHINE SULFATE (PF) 2 MG/ML IV SOLN: 2.0000 mg | INTRAVENOUS | Status: DC | PRN

## 2021-11-21 MED ORDER — ALBUTEROL SULFATE HFA 108 (90 BASE) MCG/ACT IN AERS: 2.0000 | INHALATION_SPRAY | Freq: Four times a day (QID) | RESPIRATORY_TRACT | 2 refills | Status: DC | PRN

## 2021-11-21 MED ORDER — GUAIFENESIN 100 MG/5ML PO LIQD
200.0000 mg | Freq: Once | ORAL | Status: AC
  Administered 2021-11-21: 200 mg via ORAL
  Filled 2021-11-21: qty 5

## 2021-11-21 MED ORDER — LIDOCAINE 5 % EX PTCH
1.0000 | MEDICATED_PATCH | CUTANEOUS | Status: DC
  Administered 2021-11-21: 1 via TRANSDERMAL
  Filled 2021-11-21 (×2): qty 1

## 2021-11-21 MED ORDER — ORAL CARE MOUTH RINSE: 15.0000 mL | Freq: Two times a day (BID) | OROMUCOSAL | Status: DC

## 2021-11-21 MED ORDER — ONDANSETRON HCL 4 MG/2ML IJ SOLN: 4.0000 mg | Freq: Three times a day (TID) | INTRAMUSCULAR | Status: DC | PRN

## 2021-11-21 MED ORDER — LISINOPRIL 5 MG PO TABS
5.0000 mg | ORAL_TABLET | Freq: Every day | ORAL | Status: DC
  Administered 2021-11-21 – 2021-11-22 (×2): 5 mg via ORAL
  Filled 2021-11-21 (×2): qty 1

## 2021-11-21 MED ORDER — METHYLPREDNISOLONE SODIUM SUCC 125 MG IJ SOLR
120.0000 mg | INTRAMUSCULAR | Status: DC
  Administered 2021-11-21 – 2021-11-22 (×2): 120 mg via INTRAVENOUS
  Filled 2021-11-21 (×2): qty 2

## 2021-11-21 MED ORDER — NICOTINE 21 MG/24HR TD PT24
21.0000 mg | MEDICATED_PATCH | Freq: Every day | TRANSDERMAL | Status: DC
  Administered 2021-11-21 – 2021-11-22 (×2): 21 mg via TRANSDERMAL
  Filled 2021-11-21 (×2): qty 1

## 2021-11-21 MED ORDER — PNEUMOCOCCAL VAC POLYVALENT 25 MCG/0.5ML IJ INJ: 0.5000 mL | INJECTION | INTRAMUSCULAR | Status: DC

## 2021-11-21 MED ORDER — PREDNISONE 20 MG PO TABS
60.0000 mg | ORAL_TABLET | Freq: Once | ORAL | Status: AC
  Administered 2021-11-21: 60 mg via ORAL
  Filled 2021-11-21: qty 3

## 2021-11-21 MED ORDER — METHYLPREDNISOLONE SODIUM SUCC 125 MG IJ SOLR: 60.0000 mg | Freq: Two times a day (BID) | INTRAMUSCULAR | Status: DC

## 2021-11-21 MED ORDER — AZITHROMYCIN 500 MG PO TABS
500.0000 mg | ORAL_TABLET | Freq: Every day | ORAL | Status: AC
  Administered 2021-11-21: 500 mg via ORAL
  Filled 2021-11-21: qty 1

## 2021-11-21 MED ORDER — IPRATROPIUM-ALBUTEROL 0.5-2.5 (3) MG/3ML IN SOLN
3.0000 mL | RESPIRATORY_TRACT | Status: DC
  Administered 2021-11-21 – 2021-11-22 (×5): 3 mL via RESPIRATORY_TRACT
  Filled 2021-11-21 (×5): qty 3

## 2021-11-21 MED ORDER — BENZONATATE 100 MG PO CAPS: 100.0000 mg | ORAL_CAPSULE | Freq: Three times a day (TID) | ORAL | 0 refills | Status: DC | PRN

## 2021-11-21 MED ORDER — AZITHROMYCIN 250 MG PO TABS: ORAL_TABLET | ORAL | 0 refills | Status: DC

## 2021-11-21 MED ORDER — ALBUTEROL SULFATE (2.5 MG/3ML) 0.083% IN NEBU: 2.5000 mg | INHALATION_SOLUTION | RESPIRATORY_TRACT | Status: DC | PRN

## 2021-11-21 NOTE — H&P (Signed)
History and Physical    Ryan Maxwell [id number] DOB: 1985/10/09 DOA: 11/21/2021  Referring MD/NP/PA:   PCP: Patient, No Pcp Per (Inactive)   Patient coming from:  The patient is coming from home.  At baseline, pt is independent for most of ADL.        Chief Complaint: cough, SOB and chest pain  HPI: Ryan Maxwell is a 36 y.o. male with medical history significant of hypertension, COVID-19 infection, tobacco abuse, who presents with cough, shortness breath, chest pain.  Patient states that he has cough and shortness breath since yesterday.  His shortness breath has been progressively worsening.  He coughs up blood-tinged sputum twice.  Denies fever or chills.  He also reports chest pain, which is located in the front chest, mild to moderate, sharp, nonradiating.  He states that his chest pain has resolved currently.  Denies nausea, vomiting, diarrhea or abdominal pain.  No symptoms of UTI.  Of note, initially patient had normal oxygen saturation on room air, but decreased to 88% on ambulation.  2 L oxygen started in ED.  Data Reviewed and ED Course: pt was found to have   WBC 8.9, troponin level 11--> 11, negative COVID PCR, negative D-dimer, electrolytes renal function okay, temperature normal, blood pressure 166/116, 183/115, heart rate 90, 82, RR 17. CXR showed: Chronic bilateral hilar adenopathy, otherwise negative.  Patient is placed on MedSurg bed follow-up lesion.  EKG: I have personally reviewed.  Sinus rhythm, QTc 455, left axis deviation, left atrial enlargement.  Review of Systems:   General: no fevers, chills, no body weight gain, has fatigue HEENT: no blurry vision, hearing changes or sore throat Respiratory: has dyspnea, coughing, wheezing CV: has chest pain, no palpitations GI: no nausea, vomiting, abdominal pain, diarrhea, constipation GU: no dysuria, burning on urination, increased urinary frequency, hematuria  Ext: no leg edema Neuro: no unilateral  weakness, numbness, or tingling, no vision change or hearing loss Skin: no rash, no skin tear. MSK: No muscle spasm, no deformity, no limitation of range of movement in spin Heme: No easy bruising.  Travel history: No recent long distant travel.   Allergy:  Allergies  Allergen Reactions   Dayquil [Pseudoephedrine-Apap-Dm] Shortness Of Breath   Nyquil Multi-Symptom [Pseudoeph-Doxylamine-Dm-Apap] Shortness Of Breath    Past Medical History:  Diagnosis Date   Acute respiratory failure with hypoxia ([place]) 03/06/2020   Bronchitis    Hypertension    Pneumonia    Tobacco abuse     History reviewed. No pertinent surgical history.  Social History:  reports that he has been smoking cigarettes. He has been smoking an average of 1 pack per day. He has never used smokeless tobacco. He reports that he does not currently use alcohol. He reports current drug use. Drug: Marijuana.  Family History:  Family History  Problem Relation Age of Onset   Hypertension Mother    Hypothyroidism Mother    Hypertension Other      Prior to Admission medications   Medication Sig Start Date End Date Taking? Authorizing Provider  albuterol (VENTOLIN HFA) 108 (90 Base) MCG/ACT inhaler Inhale 2 puffs into the lungs every 6 (six) hours as needed for wheezing or shortness of breath. 11/21/21  Yes Vanessa Latty, MD  azithromycin (ZITHROMAX Z-PAK) 250 MG tablet Take 2 tablets (500 mg) on  Day 1,  followed by 1 tablet (250 mg) once daily on Days 2 through 5. 11/21/21 11/26/21 Yes Vanessa [place], MD  benzonatate (TESSALON PERLES) 100 MG  capsule Take 1 capsule (100 mg total) by mouth 3 (three) times daily as needed for cough. 11/21/21 11/21/22 Yes Vanessa Groton, MD  predniSONE (DELTASONE) 20 MG tablet Take 2 tablets (40 mg total) by mouth daily with breakfast for 5 days. 11/21/21 11/26/21 Yes Vanessa [place], MD  amLODipine (NORVASC) 5 MG tablet Take 1 tablet (5 mg total) by mouth daily. 07/18/21   Paulette Blanch, MD  ketorolac  (TORADOL) 10 MG tablet Take 1 tablet (10 mg total) by mouth every 6 (six) hours as needed. Patient not taking: Reported on 07/25/2021 03/14/21   Sable Feil, PA-C  lisinopril (ZESTRIL) 5 MG tablet Take 1 tablet (5 mg total) by mouth daily. 03/11/20 04/10/20  Deatra James, MD    Physical Exam: Vitals:   11/21/21 1009 11/21/21 1011 11/21/21 1205 11/21/21 1220  BP:  (!) 153/115 (!) 162/109   Pulse: 90 82 87   Resp: 17 17 18    Temp: 98.5 F (36.9 C)  98.3 F (36.8 C)   TempSrc: Oral  Oral   SpO2: 97% 99% 96%   Weight:    (!) 141.8 kg  Height:    6\' 1"  (1.854 m)   General: Not in acute distress HEENT:       Eyes: PERRL, EOMI, no scleral icterus.       ENT: No discharge from the ears and nose, no pharynx injection, no tonsillar enlargement.        Neck: No JVD, no bruit, no mass felt. Heme: No neck lymph node enlargement. Cardiac: S1/S2, RRR, No murmurs, No gallops or rubs. Respiratory: Has minimal wheezing bilaterally GI: Soft, nondistended, nontender, no rebound pain, no organomegaly, BS present. GU: No hematuria Ext: No pitting leg edema bilaterally. 1+DP/PT pulse bilaterally. Musculoskeletal: No joint deformities, No joint redness or warmth, no limitation of ROM in spin. Skin: No rashes.  Neuro: Alert, oriented X3, cranial nerves II-XII grossly intact, moves all extremities normally.  Psych: Patient is not psychotic, no suicidal or hemocidal ideation.  Labs on Admission: I have personally reviewed following labs and imaging studies  CBC: Recent Labs  Lab 11/21/21 0637  WBC 8.9  HGB 15.2  HCT 49.7  MCV 89.9  PLT [id number]   Basic Metabolic Panel: Recent Labs  Lab 11/21/21 0637  NA 140  K 3.8  CL 105  CO2 29  GLUCOSE 104*  BUN 16  CREATININE 0.77  CALCIUM 8.8*   GFR: Estimated Creatinine Clearance: 192.7 mL/min (by C-G formula based on SCr of 0.77 mg/dL). Liver Function Tests: Recent Labs  Lab 11/21/21 0637  AST 26  ALT 33  ALKPHOS 63  BILITOT 0.6  PROT  7.8  ALBUMIN 4.0   Recent Labs  Lab 11/21/21 0637  LIPASE 25   No results for input(s): AMMONIA in the last 168 hours. Coagulation Profile: No results for input(s): INR, PROTIME in the last 168 hours. Cardiac Enzymes: No results for input(s): CKTOTAL, CKMB, CKMBINDEX, TROPONINI in the last 168 hours. BNP (last 3 results) No results for input(s): PROBNP in the last 8760 hours. HbA1C: No results for input(s): HGBA1C in the last 72 hours. CBG: No results for input(s): GLUCAP in the last 168 hours. Lipid Profile: No results for input(s): CHOL, HDL, LDLCALC, TRIG, CHOLHDL, LDLDIRECT in the last 72 hours. Thyroid Function Tests: No results for input(s): TSH, T4TOTAL, FREET4, T3FREE, THYROIDAB in the last 72 hours. Anemia Panel: No results for input(s): VITAMINB12, FOLATE, FERRITIN, TIBC, IRON, RETICCTPCT in the last 72  hours. Urine analysis:    Component Value Date/Time   COLORURINE YELLOW 03/06/2020 1633   APPEARANCEUR TURBID (A) 03/06/2020 1633   LABSPEC 1.025 03/06/2020 1633   PHURINE 5.0 03/06/2020 1633   GLUCOSEU NEGATIVE 03/06/2020 1633   [place] 03/06/2020 1633   [place] 03/06/2020 1633   [place] 03/06/2020 1633   PROTEINUR 30 (A) 03/06/2020 1633   NITRITE NEGATIVE 03/06/2020 1633   LEUKOCYTESUR NEGATIVE 03/06/2020 1633   Sepsis Labs: @LABRCNTIP (procalcitonin:4,lacticidven:4) ) Recent Results (from the past 240 hour(s))  Resp Panel by RT-PCR (Flu A&B, Covid) Nasopharyngeal Swab     Status: None   Collection Time: 11/21/21  6:37 AM   Specimen: Nasopharyngeal Swab; Nasopharyngeal(NP) swabs in vial transport medium  Result Value Ref Range Status   SARS Coronavirus 2 by RT PCR NEGATIVE NEGATIVE Final    Comment: (NOTE) SARS-CoV-2 target nucleic acids are NOT DETECTED.  The SARS-CoV-2 RNA is generally detectable in upper respiratory specimens during the acute phase of infection. The lowest concentration of SARS-CoV-2 viral copies this  assay can detect is 138 copies/mL. A negative result does not preclude SARS-Cov-2 infection and should not be used as the sole basis for treatment or other patient management decisions. A negative result may occur with  improper specimen collection/handling, submission of specimen other than nasopharyngeal swab, presence of viral mutation(s) within the areas targeted by this assay, and inadequate number of viral copies(<138 copies/mL). A negative result must be combined with clinical observations, patient history, and epidemiological information. The expected result is Negative.  Fact Sheet for Patients:  EntrepreneurPulse.com.au  Fact Sheet for Healthcare Providers:  IncredibleEmployment.be  This test is no t yet approved or cleared by the [place] FDA and  has been authorized for detection and/or diagnosis of SARS-CoV-2 by FDA under an Emergency Use Authorization (EUA). This EUA will remain  in effect (meaning this test can be used) for the duration of the COVID-19 declaration under Section 564(b)(1) of the Act, 21 U.S.C.section 360bbb-3(b)(1), unless the authorization is terminated  or revoked sooner.       Influenza A by PCR NEGATIVE NEGATIVE Final   Influenza B by PCR NEGATIVE NEGATIVE Final    Comment: (NOTE) The Xpert Xpress SARS-CoV-2/FLU/RSV plus assay is intended as an aid in the diagnosis of influenza from Nasopharyngeal swab specimens and should not be used as a sole basis for treatment. Nasal washings and aspirates are unacceptable for Xpert Xpress SARS-CoV-2/FLU/RSV testing.  Fact Sheet for Patients: EntrepreneurPulse.com.au  Fact Sheet for Healthcare Providers: IncredibleEmployment.be  This test is not yet approved or cleared by the [place] FDA and has been authorized for detection and/or diagnosis of SARS-CoV-2 by FDA under an Emergency Use Authorization (EUA). This EUA will  remain in effect (meaning this test can be used) for the duration of the COVID-19 declaration under Section 564(b)(1) of the Act, 21 U.S.C. section 360bbb-3(b)(1), unless the authorization is terminated or revoked.  Performed at [facility], [address], [place], [place] [address]      Radiological Exams on Admission: DG Chest 2 View  Result Date: 11/21/2021 CLINICAL DATA:  Shortness of breath. EXAM: CHEST - 2 VIEW COMPARISON:  07/25/2021 FINDINGS: Heart size is normal. Chronic bilateral hilar adenopathy. No pleural effusion or edema identified. No airspace opacification. Visualized osseous structures are unremarkable. IMPRESSION: No active cardiopulmonary abnormalities. Electronically Signed   By: Kerby Moors M.D.   On: 11/21/2021 07:37      Assessment/Plan Principal Problem:   Acute bronchitis  Active Problems:   Acute respiratory failure with hypoxia (HCC)   Essential hypertension   Tobacco abuse   Chest pain   Acute respiratory failure with hypoxia due to possible acute bronchitis: Patient has cough, shortness of breath, blood-tinged sputum production, D-dimer negative, low suspicions for PE.  Chest x-ray negative for infiltration.  Patient has minimal wheezing on auscultation, indicating possible acute bronchitis.  -will place to med-surg bed for observation -Bronchodilators -Solu-Medrol 60 mg IV bid -Z pak  -Mucinex for cough  -sputum culture -check RVP -Nasal cannula oxygen as needed to maintain O2 saturation 93% or greater  Essential hypertension -IV hydralazine as needed -Continue home lisinopril and amlodipine  Tobacco abuse -Did counseling about importance of quitting tobacco -Nicotine patch  Chest pain: Troponin 11 --> 11.  Chest pain has resolved.  Possibly related to bronchitis -Observe closely -Will not start aspirin due to possible hemoptysis             DVT ppx: SCD  Code Status: Full code  Family Communication:  Yes,  patient's mother and fianc at bedside  Disposition Plan:  Anticipate discharge back to previous environment  Consults called:  none  Admission status and Level of care: Telemetry Medical:   for obs    Severity of Illness:  The appropriate patient status for this patient is OBSERVATION. Observation status is judged to be reasonable and necessary in order to provide the required intensity of service to ensure the patient's safety. The patient's presenting symptoms, physical exam findings, and initial radiographic and laboratory data in the context of their medical condition is felt to place them at decreased risk for further clinical deterioration. Furthermore, it is anticipated that the patient will be medically stable for discharge from the hospital within 2 midnights of admission.        Date of Service 11/21/2021    Ivor Costa [facility]   If 7PM-7AM, please contact night-coverage [email or web address] 11/21/2021, 12:35 PM

## 2021-11-21 NOTE — Discharge Instructions (Addendum)
Take the steroids and antibiotics to help with your infection.  Use inhaler as needed for shortness of breath.  I suspect that you could have some bronchitis and is important that you cut down and eventually stop smoking.  He can also get guaifenesin over-the-counter to help with the cough  Return to the ER for worsening shortness of breath or any other concern

## 2021-11-21 NOTE — ED Triage Notes (Signed)
Pt states he started having a cough yesterday. He took some medicine for the cough but then vomited. Now complains of chest pain that started as he was driving here.

## 2021-11-21 NOTE — ED Notes (Signed)
John RN aware of assigned bed 

## 2021-11-21 NOTE — Progress Notes (Signed)
PHARMACIST - PHYSICIAN COMMUNICATION   CONCERNING: Methylprednisolone IV    Current order: Methylprednisolone IV 60 mg every 12 hours      DESCRIPTION: Per [place] Protocol:   IV methylprednisolone will be converted to either a q12h or q24h frequency with the same total daily dose (TDD).  Ordered Dose: 1 to 125 mg TDD; convert to: TDD q24h.  Ordered Dose: 126 to 250 mg TDD; convert to: TDD div q12h.  Ordered Dose: >250 mg TDD; DAW.  Order has been adjusted to: Methylprednisolone IV 120mg   every 24 hours    , PharmD, BCPS Clinical Pharmacist  11/21/2021 11:09 AM

## 2021-11-21 NOTE — ED Provider Notes (Addendum)
[facility] Provider Note    Event Date/Time   First MD Initiated Contact with Patient 11/21/21 [phone]     (approximate)   History   Cough, Nausea, Chest Pain, and Emesis   HPI  Ryan Maxwell is a 36 y.o. male with history of hypertension, prior COVID who comes in with cough.  Patient reportedly took some cough medicine but then had an episode of vomitus.  He now complains of chest pain while driving here.  Patient reports having coughing that started yesterday.  He then stated that he tried to take some cough drops and then he started to cough up some blood-tinged sputum which made him worried so he was clinic come to the ER to be evaluated.Marland Kitchen  He reports 1 episode of vomiting with the coughing but denies any continued vomiting or abdominal pain.  He does report some midsternal chest pain as well that is worse with the coughing as well as some mild shortness of breath associated with it.  I reviewed the hospital admission from October 2022 where patient had home positive for COVID but negative PCR but was hypoxic to 88%.  There was concern for possible sarcoidosis so he was referred to pulmonary   Physical Exam   Triage Vital Signs: ED Triage Vitals  Enc Vitals Group     BP 11/21/21 0638 (!) 166/116     Pulse Rate 11/21/21 0638 88     Resp 11/21/21 0638 20     Temp 11/21/21 0638 97.7 F (36.5 C)     Temp Source 11/21/21 0638 Oral     SpO2 11/21/21 0638 94 %     Weight 11/21/21 0634 296 lb 1.2 oz (134.3 kg)     Height --      Head Circumference --      Peak Flow --      Pain Score 11/21/21 0634 9     Pain Loc --      Pain Edu? --      Excl. in GC? --     Most recent vital signs: Vitals:   11/21/21 0638  BP: (!) 166/116  Pulse: 88  Resp: 20  Temp: 97.7 F (36.5 C)  SpO2: 94%     General: Awake, no distress.  CV:  Good peripheral perfusion.  Resp:  Normal effort.  Clear lung Abd:  No distention.  Nontender Other:  No leg swelling or  calf tenderness   ED Results / Procedures / Treatments   Labs (all labs ordered are listed, but only abnormal results are displayed) Labs Reviewed  RESP PANEL BY RT-PCR (FLU A&B, COVID) [id number]  BASIC METABOLIC PANEL  CBC  TROPONIN I (HIGH SENSITIVITY)     EKG  My interpretation of EKG: Normal sinus rate of 89, no ST elevation, no T wave inversions, normal intervals   RADIOLOGY I have reviewed the xray personally and agree with radiology read negative pna    PROCEDURES:  Critical Care performed: No  .1-3 Lead EKG Interpretation Performed by: Concha Se, MD Authorized by: Concha Se, MD     Interpretation: normal     ECG rate:  80   ECG rate assessment: normal     Rhythm: sinus rhythm     Ectopy: none     Conduction: normal   .Critical Care Performed by: Concha Se, MD Authorized by: Concha Se, MD   Critical care provider statement:    Critical care time (minutes):  30   Critical care was necessary to treat or prevent imminent or life-threatening deterioration of the following conditions:  Respiratory failure   Critical care was time spent personally by me on the following activities:  Development of treatment plan with patient or surrogate, discussions with consultants, evaluation of patient's response to treatment, examination of patient, ordering and review of laboratory studies, ordering and review of radiographic studies, ordering and performing treatments and interventions, pulse oximetry, re-evaluation of patient's condition and review of old charts   MEDICATIONS ORDERED IN ED: Medications  lidocaine (LIDODERM) 5 % 1 patch (1 patch Transdermal Patch Applied 11/21/21 0748)  guaiFENesin (ROBITUSSIN) 100 MG/5ML liquid 200 mg (200 mg Oral Given 11/21/21 0747)  acetaminophen (TYLENOL) tablet 1,000 mg (1,000 mg Oral Given 11/21/21 0747)  ipratropium-albuterol (DUONEB) 0.5-2.5 (3) MG/3ML nebulizer solution 3 mL (3 mLs Nebulization Given 11/21/21 0747)   predniSONE (DELTASONE) tablet 60 mg (60 mg Oral Given 11/21/21 0857)     IMPRESSION / MDM / ASSESSMENT AND PLAN / ED COURSE  I reviewed the triage vital signs and the nursing notes.                              Differential diagnosis includes, but is not limited to, pneumonia, COVID, flu, pneumothorax, ACS.  We will also get D-dimer to evaluate for PE given unable to [facility] out due to hemoptysis but I suspect that if it is negative it could just be bronchitis.  Do not hear any obvious wheezing at this time but he does have a significant smoking history with history of bronchitis in the past.  We will trial a DuoNeb, cough syrup, lidocaine patch and Tylenol to help with symptoms  D-dimer negative and patient is low risk for PE BMP shows normal kidney function CBC no anemia Initial troponin negative  On repeat evaluation patient feeling better after the medications.  When I read listen to him he does have some wheezing noted therefore I do suspect that he has some COPD versus bronchitis.  Encouraged smoking cessation.  We will start him on inhaler, prednisone, azithromycin and recommend over-the-counter cough syrup.  We will give him pulmonary number for follow-up patient expressed understanding felt comfortable with this plan.  We will get ambulatory sat and if he does well we will plan for discharge home   Attempted to ambulate patient and oxygen levels dropped down to 86% with barely any movement and even his sitting in the bed he was satting at 88% therefore patient was placed on 2 L given his concern for new hypoxic respiratory failure I will discuss with the hospital team for admission.  Suspect that it could be from some bronchitis.   The patient is on the cardiac monitor to evaluate for evidence of arrhythmia and/or significant heart rate changes  FINAL CLINICAL IMPRESSION(S) / ED DIAGNOSES   Final diagnoses:  Bronchitis     Rx / DC Orders   ED Discharge Orders           Ordered    azithromycin (ZITHROMAX Z-PAK) 250 MG tablet        11/21/21 0921    predniSONE (DELTASONE) 20 MG tablet  Daily with breakfast        11/21/21 0921    benzonatate (TESSALON PERLES) 100 MG capsule  3 times daily PRN        11/21/21 0921    albuterol (VENTOLIN HFA) 108 (90 Base) MCG/ACT inhaler  Every 6 hours PRN        11/21/21 2505             Note:  This document was prepared using Dragon voice recognition software and may include unintentional dictation errors.   Concha Se, MD 11/21/21 3976    Concha Se, MD 11/21/21 1009

## 2021-11-22 DIAGNOSIS — J9601 Acute respiratory failure with hypoxia: Secondary | ICD-10-CM | POA: Diagnosis not present

## 2021-11-22 DIAGNOSIS — J206 Acute bronchitis due to rhinovirus: Secondary | ICD-10-CM | POA: Diagnosis not present

## 2021-11-22 DIAGNOSIS — Z720 Tobacco use: Secondary | ICD-10-CM | POA: Diagnosis not present

## 2021-11-22 DIAGNOSIS — I10 Essential (primary) hypertension: Secondary | ICD-10-CM | POA: Diagnosis not present

## 2021-11-22 LAB — CULTURE, RESPIRATORY W GRAM STAIN

## 2021-11-22 LAB — CBC
HCT: 49.9 % (ref 39.0–52.0)
Hemoglobin: 14.9 g/dL (ref 13.0–17.0)
MCH: 26.7 pg (ref 26.0–34.0)
MCHC: 29.9 g/dL — ABNORMAL LOW (ref 30.0–36.0)
MCV: 89.4 fL (ref 80.0–100.0)
Platelets: 242 10*3/uL (ref 150–400)
RBC: 5.58 MIL/uL (ref 4.22–5.81)
RDW: 13.4 % (ref 11.5–15.5)
WBC: 14.3 10*3/uL — ABNORMAL HIGH (ref 4.0–10.5)
nRBC: 0 % (ref 0.0–0.2)

## 2021-11-22 MED ORDER — AZITHROMYCIN 250 MG PO TABS: 250.0000 mg | ORAL_TABLET | Freq: Every day | ORAL | 0 refills | Status: AC

## 2021-11-22 MED ORDER — IPRATROPIUM-ALBUTEROL 0.5-2.5 (3) MG/3ML IN SOLN
3.0000 mL | Freq: Four times a day (QID) | RESPIRATORY_TRACT | Status: DC
  Administered 2021-11-22: 13:00:00 3 mL via RESPIRATORY_TRACT
  Filled 2021-11-22: qty 3

## 2021-11-22 NOTE — Discharge Summary (Signed)
Physician Discharge Summary  Ryan Maxwell NLZ:[id number] DOB: August 15, 1986 DOA: 11/21/2021  PCP: Patient, No Pcp Per (Inactive)  Admit date: 11/21/2021 Discharge date: 11/22/2021  Admitted From: Home Disposition: Home  Recommendations for Outpatient Follow-up:  Follow up with PCP within 1-2 weeks Follow up with pulmonology outpatient Recommend PFTs and/or sleep study when fully recovered  Discharge Condition:stable, improved CODE STATUS:  Code Status: Full Code  Regular healthy diet  Brief/Interim Summary: Ryan Maxwell is a 36 y.o. male with medical history significant of hypertension, COVID-19 infection, tobacco abuse, who presents with cough, shortness breath, chest pain.   Patient states that he has cough and shortness breath since yesterday.  His shortness breath has been progressively worsening.  He coughs up blood-tinged sputum twice.  Denies fever or chills.  He also reports chest pain, which is located in the front chest, mild to moderate, sharp, nonradiating.  He states that his chest pain has resolved currently.  Denies nausea, vomiting, diarrhea or abdominal pain.  No symptoms of UTI.   Of note, initially patient had normal oxygen saturation on room air, but decreased to 88% on ambulation.  2 L oxygen started in ED and that was the maximum requirement throughout stay. He also received steroid course and antibiotics.   Since admission- patient was gradually weaned from oxygen and was able to maintain saturations >90% while ambulating. He endorsed recovery to baseline respiratory status and requested to be discharged.  His respiratory panel resulted positive for rhinovirus. Even with feeling back to baseline and having normal vital signs, patient appeared to have increased WOB which may be related to body habitus or chronic lung disease related to tobacco use. I suspect that he has poor baseline pulmonary function based on such a significant decrease in respiratory status with  rhinovirus. Recommend outpatient pulmonology follow up for PFTs and/or sleep study.  Discharge Diagnoses:  Principal Problem:   Acute bronchitis Active Problems:   Acute respiratory failure with hypoxia [facility])   Essential hypertension   Tobacco abuse   Chest pain   Discharge Instructions     Ambulatory referral to Pulmonology   Complete by: As directed    Reason for referral: Asthma/COPD      Allergies as of 11/22/2021       Reactions   Dayquil [pseudoephedrine-apap-dm] Shortness Of Breath   Nyquil Multi-symptom [pseudoeph-doxylamine-dm-apap] Shortness Of Breath        Medication List     STOP taking these medications    ketorolac 10 MG tablet Commonly known as: TORADOL       TAKE these medications    albuterol 108 (90 Base) MCG/ACT inhaler Commonly known as: VENTOLIN HFA Inhale 2 puffs into the lungs every 6 (six) hours as needed for wheezing or shortness of breath. What changed:  when to take this reasons to take this   amLODipine 5 MG tablet Commonly known as: NORVASC Take 1 tablet (5 mg total) by mouth daily.   azithromycin 250 MG tablet Commonly known as: ZITHROMAX Take 1 tablet (250 mg total) by mouth daily for 4 days. Start taking on: November 23, 2021   benzonatate 100 MG capsule Commonly known as: Lawyer Take 1 capsule (100 mg total) by mouth 3 (three) times daily as needed for cough.   lisinopril 5 MG tablet Commonly known as: ZESTRIL Take 1 tablet (5 mg total) by mouth daily.   predniSONE 20 MG tablet Commonly known as: DELTASONE Take 2 tablets (40 mg total) by mouth daily with  breakfast for 5 days.        Follow-up Information     Mertie Moores, MD. Schedule an appointment as soon as possible for a visit .   Specialty: Specialist Contact information: [address] [place] [address] [phone]                Allergies  Allergen Reactions   Dayquil [Pseudoephedrine-Apap-Dm] Shortness Of  Breath   Nyquil Multi-Symptom [Pseudoeph-Doxylamine-Dm-Apap] Shortness Of Breath    Consultations: none  Procedures/Studies: DG Chest 2 View  Result Date: 11/21/2021 CLINICAL DATA:  Shortness of breath. EXAM: CHEST - 2 VIEW COMPARISON:  07/25/2021 FINDINGS: Heart size is normal. Chronic bilateral hilar adenopathy. No pleural effusion or edema identified. No airspace opacification. Visualized osseous structures are unremarkable. IMPRESSION: No active cardiopulmonary abnormalities. Electronically Signed   By: Signa Kell M.D.   On: 11/21/2021 07:37    Subjective: Patient feels back to baseline. Denies dyspnea with ambulation. Continues to have mild cough. He stresses that he needs to be discharged immediately so he can get back to work.  Discharge Exam: Vitals:   11/22/21 0753 11/22/21 1100  BP:    Pulse:    Resp:    Temp:    SpO2: 92% 94%    General: Pt is alert, awake, not in acute distress Cardiovascular: RRR, S1/S2 +, no rubs, no gallops Respiratory: CTA bilaterally, no wheezing, no rhonchi. Increased work of breathing that appears to be related to upper respiratory stertor. No dyspnea with conversation or ambulation Abdominal: Soft, NT, ND, bowel sounds + Extremities: no edema, no cyanosis  Labs: Basic Metabolic Panel: Recent Labs  Lab 11/21/21 0637  NA 140  K 3.8  CL 105  CO2 29  GLUCOSE 104*  BUN 16  CREATININE 0.77  CALCIUM 8.8*   CBC: Recent Labs  Lab 11/21/21 0637 11/22/21 0432  WBC 8.9 14.3*  HGB 15.2 14.9  HCT 49.7 49.9  MCV 89.9 89.4  PLT 221 242    Microbiology Recent Results (from the past 240 hour(s))  Resp Panel by RT-PCR (Flu A&B, Covid) Nasopharyngeal Swab     Status: None   Collection Time: 11/21/21  6:37 AM   Specimen: Nasopharyngeal Swab; Nasopharyngeal(NP) swabs in vial transport medium  Result Value Ref Range Status   SARS Coronavirus 2 by RT PCR NEGATIVE NEGATIVE Final    Comment: (NOTE) SARS-CoV-2 target nucleic acids are NOT  DETECTED.  The SARS-CoV-2 RNA is generally detectable in upper respiratory specimens during the acute phase of infection. The lowest concentration of SARS-CoV-2 viral copies this assay can detect is 138 copies/mL. A negative result does not preclude SARS-Cov-2 infection and should not be used as the sole basis for treatment or other patient management decisions. A negative result may occur with  improper specimen collection/handling, submission of specimen other than nasopharyngeal swab, presence of viral mutation(s) within the areas targeted by this assay, and inadequate number of viral copies(<138 copies/mL). A negative result must be combined with clinical observations, patient history, and epidemiological information. The expected result is Negative.  Fact Sheet for Patients:  BloggerCourse.com  Fact Sheet for Healthcare Providers:  SeriousBroker.it  This test is no t yet approved or cleared by the [place] FDA and  has been authorized for detection and/or diagnosis of SARS-CoV-2 by FDA under an Emergency Use Authorization (EUA). This EUA will remain  in effect (meaning this test can be used) for the duration of the COVID-19 declaration under Section 564(b)(1) of the Act,  21 U.S.C.section 360bbb-3(b)(1), unless the authorization is terminated  or revoked sooner.       Influenza A by PCR NEGATIVE NEGATIVE Final   Influenza B by PCR NEGATIVE NEGATIVE Final    Comment: (NOTE) The Xpert Xpress SARS-CoV-2/FLU/RSV plus assay is intended as an aid in the diagnosis of influenza from Nasopharyngeal swab specimens and should not be used as a sole basis for treatment. Nasal washings and aspirates are unacceptable for Xpert Xpress SARS-CoV-2/FLU/RSV testing.  Fact Sheet for Patients: BloggerCourse.com  Fact Sheet for Healthcare Providers: SeriousBroker.it  This test is not yet  approved or cleared by the [place] FDA and has been authorized for detection and/or diagnosis of SARS-CoV-2 by FDA under an Emergency Use Authorization (EUA). This EUA will remain in effect (meaning this test can be used) for the duration of the COVID-19 declaration under Section 564(b)(1) of the Act, 21 U.S.C. section 360bbb-3(b)(1), unless the authorization is terminated or revoked.  Performed at [facility], [address] [place], [place] [address]   Respiratory (~20 pathogens) panel by PCR     Status: Abnormal   Collection Time: 11/21/21  6:37 AM   Specimen: Nasopharyngeal Swab; Respiratory  Result Value Ref Range Status   Adenovirus NOT DETECTED NOT DETECTED Final   Coronavirus 229E NOT DETECTED NOT DETECTED Final    Comment: (NOTE) The Coronavirus on the Respiratory Panel, DOES NOT test for the novel  Coronavirus (2019 nCoV)    Coronavirus HKU1 NOT DETECTED NOT DETECTED Final   Coronavirus NL63 NOT DETECTED NOT DETECTED Final   Coronavirus OC43 NOT DETECTED NOT DETECTED Final   Metapneumovirus NOT DETECTED NOT DETECTED Final   Rhinovirus / Enterovirus DETECTED (A) NOT DETECTED Final   Influenza A NOT DETECTED NOT DETECTED Final   Influenza B NOT DETECTED NOT DETECTED Final   Parainfluenza Virus 1 NOT DETECTED NOT DETECTED Final   Parainfluenza Virus 2 NOT DETECTED NOT DETECTED Final   Parainfluenza Virus 3 NOT DETECTED NOT DETECTED Final   Parainfluenza Virus 4 NOT DETECTED NOT DETECTED Final   Respiratory Syncytial Virus NOT DETECTED NOT DETECTED Final   Bordetella pertussis NOT DETECTED NOT DETECTED Final   Bordetella Parapertussis NOT DETECTED NOT DETECTED Final   Chlamydophila pneumoniae NOT DETECTED NOT DETECTED Final   Mycoplasma pneumoniae NOT DETECTED NOT DETECTED Final    Comment: Performed at [facility], [address], [place], [place] [address]  Expectorated Sputum Assessment w Gram Stain, Rflx to Resp Cult     Status: None    Collection Time: 11/21/21 12:31 PM   Specimen: Expectorated Sputum  Result Value Ref Range Status   Specimen Description EXPECTORATED SPUTUM  Final   Special Requests NONE  Final   Sputum evaluation   Final    THIS SPECIMEN IS ACCEPTABLE FOR SPUTUM CULTURE Performed at [facility], [address] [place], [place] [address]    Report Status 11/21/2021 FINAL  Final  Culture, Respiratory w Gram Stain     Status: None   Collection Time: 11/21/21 12:31 PM  Result Value Ref Range Status   Specimen Description   Final    EXPECTORATED SPUTUM Performed at [facility], [address], [place], [place] [address]    Special Requests   Final    NONE Reflexed from [id number] Performed at [facility], [address] [place], [place] [address]    Gram Stain   Final    FEW SQUAMOUS EPITHELIAL CELLS PRESENT ABUNDANT WBC PRESENT,BOTH PMN  AND MONONUCLEAR FEW GRAM POSITIVE COCCI MODERATE GRAM VARIABLE ROD    Culture   Final    RARE GROUP B STREP(S.AGALACTIAE)ISOLATED TESTING AGAINST S. AGALACTIAE NOT ROUTINELY PERFORMED DUE TO PREDICTABILITY OF AMP/PEN/VAN SUSCEPTIBILITY. Performed at [facility], [address] [place], [place] [address]    Report Status 11/22/2021 FINAL  Final    Time coordinating discharge: Over 30 minutes  Leeroy Bockhelsey L Vadim Centola, MD  [facility] 11/22/2021, 1:40 PM

## 2021-11-26 IMAGING — CR DG CHEST 2V
2 series · 2 of 2 positions shown · non-contrast
Comparison: 07/18/2021.  CT 03/07/2020.

CLINICAL DATA: Shortness of breath.  Coronavirus infection.

EXAM:
CHEST - 2 VIEW

[chest lat]
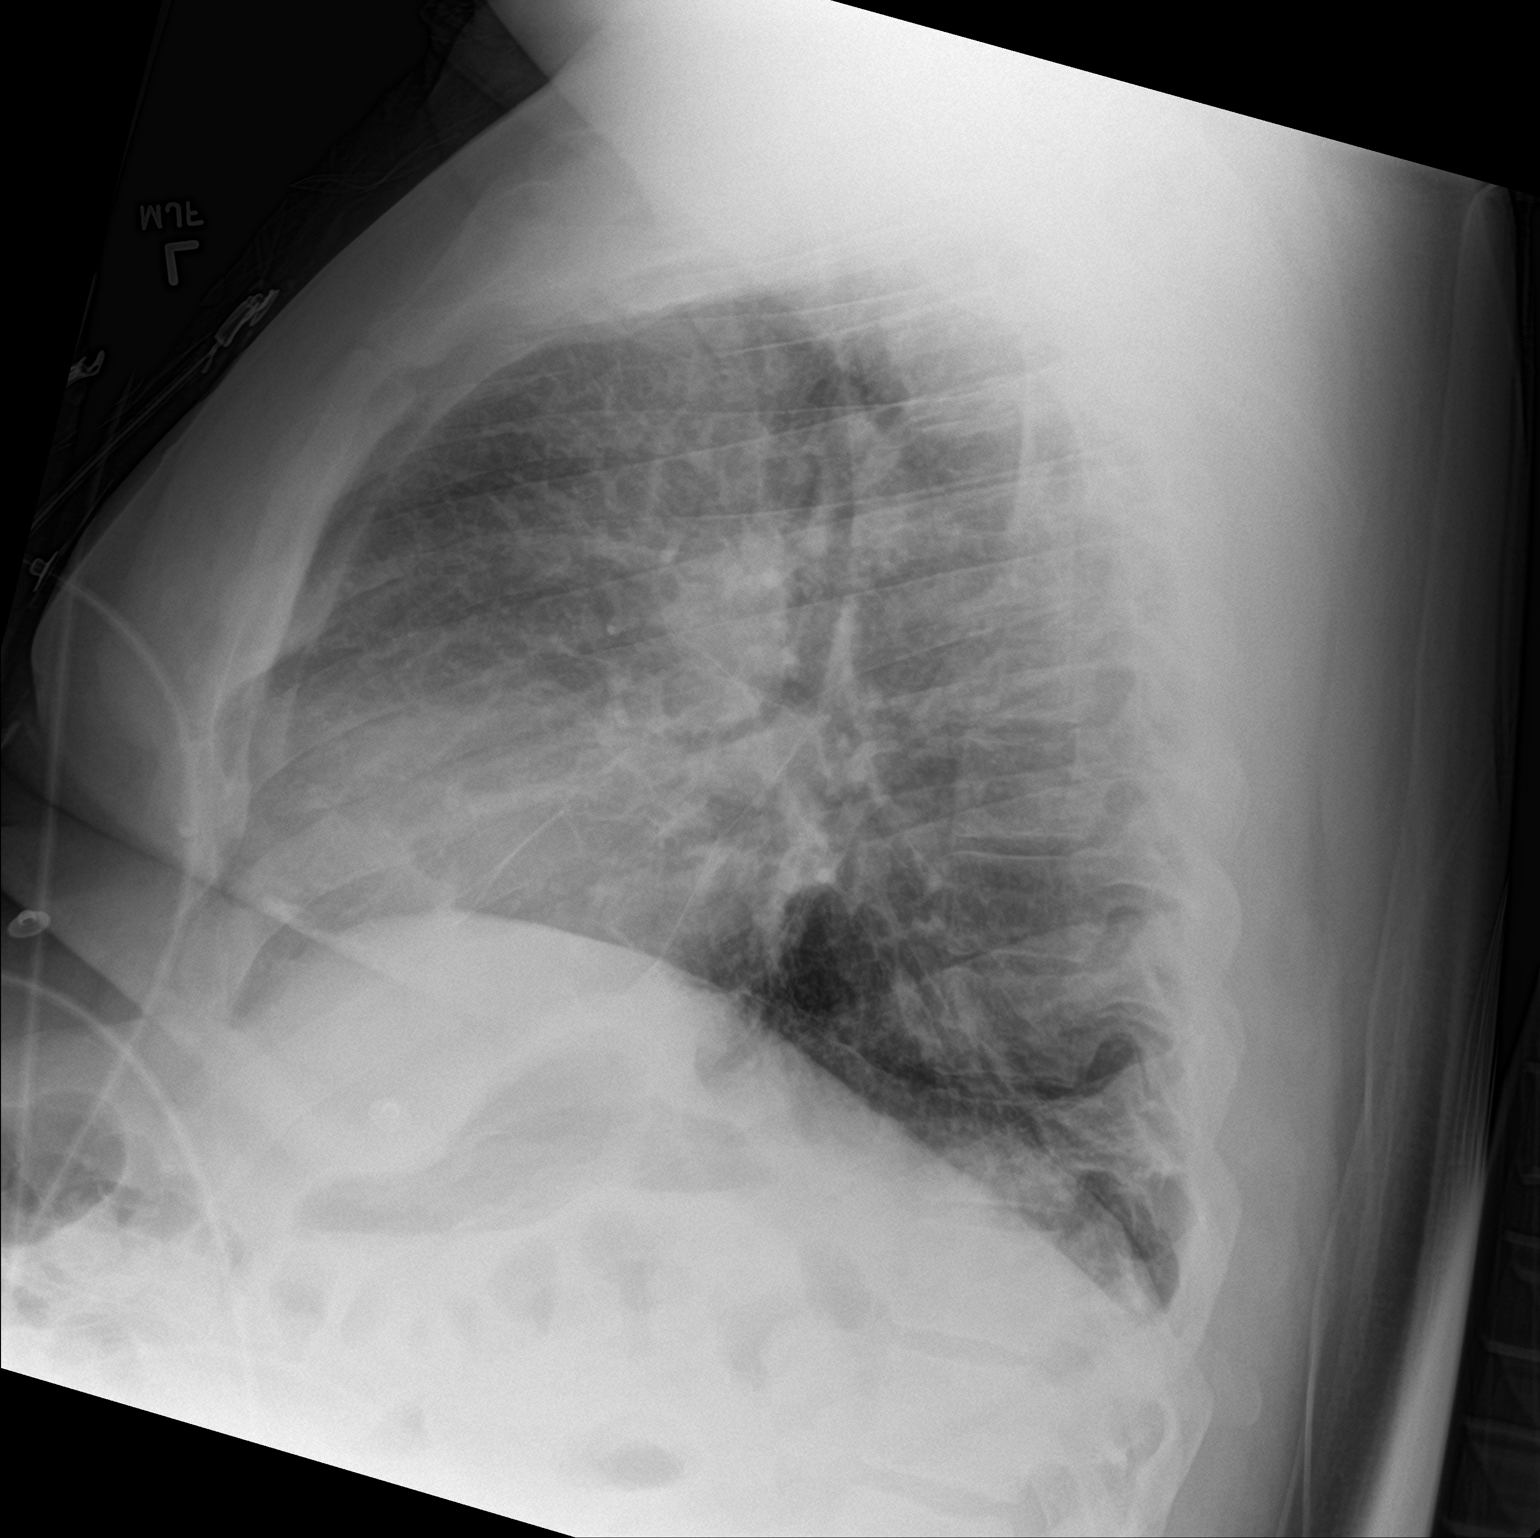

[chest ap]
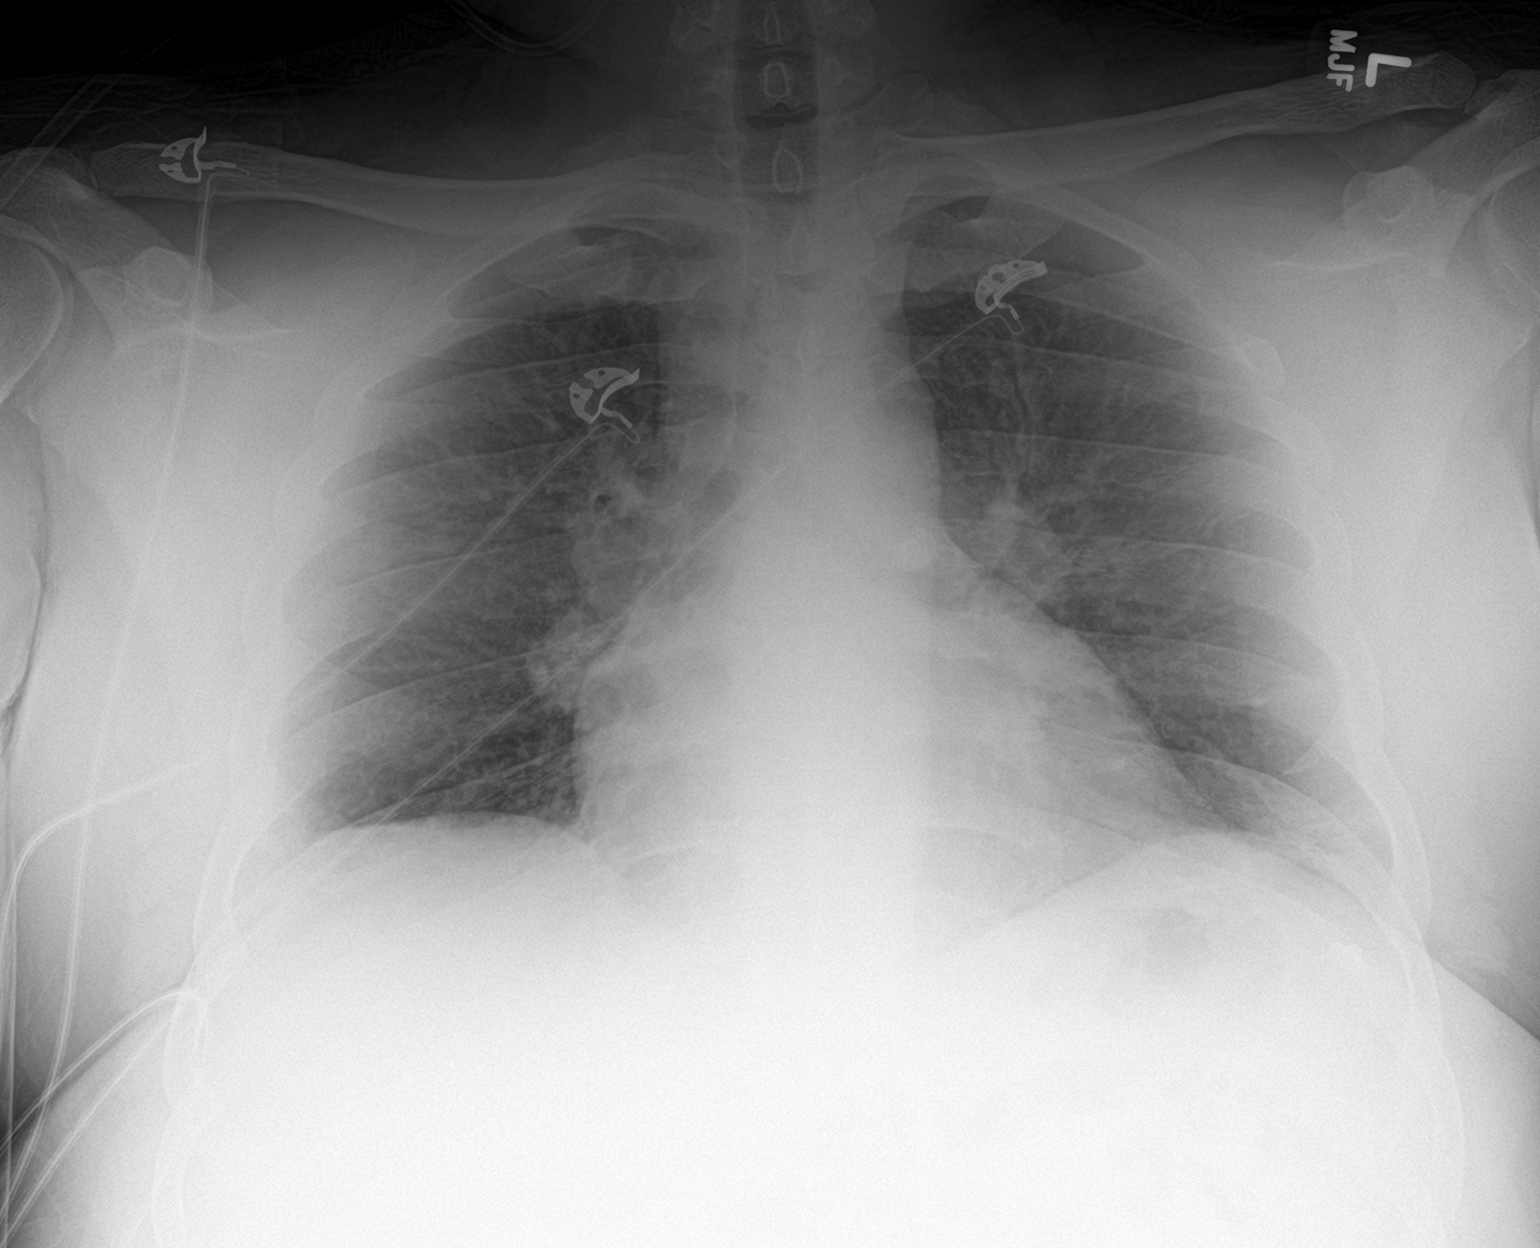

[2 of 2 positions shown; findings below may reference images not displayed]

FINDINGS: Heart size is normal. Chronic bilateral hilar lymphadenopathy. No
sign of infiltrate, collapse or effusion. Bony structures are
unremarkable.
IMPRESSION: No pneumonia by radiography.

Chronic bilateral hilar lymphadenopathy, present for many years.
Question sarcoid.

## 2021-12-01 ENCOUNTER — Encounter: Payer: Self-pay | Admitting: Intensive Care

## 2021-12-01 ENCOUNTER — Other Ambulatory Visit: Payer: Self-pay

## 2021-12-01 ENCOUNTER — Emergency Department: Payer: Managed Care, Other (non HMO)

## 2021-12-01 ENCOUNTER — Emergency Department
Admission: EM | Admit: 2021-12-01 | Discharge: 2021-12-01 | Disposition: A | Discharge status: Home / Self Care | Payer: Managed Care, Other (non HMO) | Attending: Emergency Medicine | Admitting: Emergency Medicine

## 2021-12-01 DIAGNOSIS — R079 Chest pain, unspecified: Secondary | ICD-10-CM | POA: Insufficient documentation

## 2021-12-01 DIAGNOSIS — Z7982 Long term (current) use of aspirin: Secondary | ICD-10-CM | POA: Diagnosis not present

## 2021-12-01 DIAGNOSIS — Z5321 Procedure and treatment not carried out due to patient leaving prior to being seen by health care provider: Secondary | ICD-10-CM | POA: Insufficient documentation

## 2021-12-01 DIAGNOSIS — Z20822 Contact with and (suspected) exposure to covid-19: Secondary | ICD-10-CM | POA: Diagnosis not present

## 2021-12-01 LAB — BASIC METABOLIC PANEL
Anion gap: 9 (ref 5–15)
BUN: 16 mg/dL (ref 6–20)
CO2: 30 mmol/L (ref 22–32)
Calcium: 8.2 mg/dL — ABNORMAL LOW (ref 8.9–10.3)
Chloride: 98 mmol/L (ref 98–111)
Creatinine, Ser: 0.88 mg/dL (ref 0.61–1.24)
GFR, Estimated: >60 mL/min (ref >60)
Glucose, Bld: 134 mg/dL — ABNORMAL HIGH (ref 70–99)
Potassium: 3.6 mmol/L (ref 3.5–5.1)
Sodium: 137 mmol/L (ref 135–145)

## 2021-12-01 LAB — RESP PANEL BY RT-PCR (FLU A&B, COVID) ARPGX2
Influenza A by PCR: NEGATIVE
Influenza B by PCR: NEGATIVE
SARS Coronavirus 2 by RT PCR: NEGATIVE

## 2021-12-01 LAB — CBC
HCT: 48.9 % (ref 39.0–52.0)
Hemoglobin: 14.4 g/dL (ref 13.0–17.0)
MCH: 26.6 pg (ref 26.0–34.0)
MCHC: 29.4 g/dL — ABNORMAL LOW (ref 30.0–36.0)
MCV: 90.2 fL (ref 80.0–100.0)
Platelets: 225 10*3/uL (ref 150–400)
RBC: 5.42 MIL/uL (ref 4.22–5.81)
RDW: 13.5 % (ref 11.5–15.5)
WBC: 8.4 10*3/uL (ref 4.0–10.5)
nRBC: 0 % (ref 0.0–0.2)

## 2021-12-01 LAB — TROPONIN I (HIGH SENSITIVITY)
Troponin I (High Sensitivity): 56 ng/L — ABNORMAL HIGH (ref <18)
Troponin I (High Sensitivity): 64 ng/L — ABNORMAL HIGH (ref <18)

## 2021-12-01 NOTE — ED Notes (Signed)
Patient placed on 2L O2 at this time

## 2021-12-01 NOTE — ED Notes (Signed)
Called pt [phone] no answer left message

## 2021-12-01 NOTE — ED Triage Notes (Signed)
Patient c/o dull central chest pain that started last night. Turned into sharp chest pain today. Arrived by EMS who administered 324 aspirin and 1inch nitro to chest. Patient denies pain upon arrival

## 2021-12-01 NOTE — ED Notes (Signed)
Called pt several times no answer  

## 2022-04-22 ENCOUNTER — Other Ambulatory Visit: Payer: Self-pay

## 2022-04-22 ENCOUNTER — Inpatient Hospital Stay
Admission: EM | Admit: 2022-04-22 | Discharge: 2022-04-24 | DRG: 193 | Disposition: A | Discharge status: Home / Self Care | Payer: Managed Care, Other (non HMO) | Attending: Internal Medicine | Admitting: Internal Medicine

## 2022-04-22 ENCOUNTER — Emergency Department: Payer: Managed Care, Other (non HMO)

## 2022-04-22 ENCOUNTER — Encounter: Payer: Self-pay | Admitting: Family Medicine

## 2022-04-22 DIAGNOSIS — G4733 Obstructive sleep apnea (adult) (pediatric): Secondary | ICD-10-CM | POA: Diagnosis not present

## 2022-04-22 DIAGNOSIS — J42 Unspecified chronic bronchitis: Secondary | ICD-10-CM | POA: Diagnosis present

## 2022-04-22 DIAGNOSIS — R7303 Prediabetes: Secondary | ICD-10-CM | POA: Diagnosis present

## 2022-04-22 DIAGNOSIS — Z888 Allergy status to other drugs, medicaments and biological substances status: Secondary | ICD-10-CM

## 2022-04-22 DIAGNOSIS — Z6841 Body Mass Index (BMI) 40.0 and over, adult: Secondary | ICD-10-CM

## 2022-04-22 DIAGNOSIS — Z8249 Family history of ischemic heart disease and other diseases of the circulatory system: Secondary | ICD-10-CM | POA: Diagnosis not present

## 2022-04-22 DIAGNOSIS — J40 Bronchitis, not specified as acute or chronic: Secondary | ICD-10-CM | POA: Diagnosis not present

## 2022-04-22 DIAGNOSIS — J189 Pneumonia, unspecified organism: Secondary | ICD-10-CM | POA: Diagnosis not present

## 2022-04-22 DIAGNOSIS — I10 Essential (primary) hypertension: Secondary | ICD-10-CM | POA: Diagnosis present

## 2022-04-22 DIAGNOSIS — Z20822 Contact with and (suspected) exposure to covid-19: Secondary | ICD-10-CM | POA: Diagnosis present

## 2022-04-22 DIAGNOSIS — F17210 Nicotine dependence, cigarettes, uncomplicated: Secondary | ICD-10-CM | POA: Diagnosis present

## 2022-04-22 DIAGNOSIS — Z79899 Other long term (current) drug therapy: Secondary | ICD-10-CM

## 2022-04-22 DIAGNOSIS — E662 Morbid (severe) obesity with alveolar hypoventilation: Secondary | ICD-10-CM | POA: Diagnosis present

## 2022-04-22 DIAGNOSIS — Z720 Tobacco use: Secondary | ICD-10-CM | POA: Diagnosis present

## 2022-04-22 DIAGNOSIS — R0902 Hypoxemia: Principal | ICD-10-CM

## 2022-04-22 DIAGNOSIS — J9601 Acute respiratory failure with hypoxia: Secondary | ICD-10-CM | POA: Diagnosis present

## 2022-04-22 DIAGNOSIS — R519 Headache, unspecified: Secondary | ICD-10-CM | POA: Diagnosis present

## 2022-04-22 DIAGNOSIS — J9602 Acute respiratory failure with hypercapnia: Secondary | ICD-10-CM | POA: Diagnosis present

## 2022-04-22 HISTORY — DX: Pneumonia, unspecified organism: J18.9

## 2022-04-22 LAB — BASIC METABOLIC PANEL
Anion gap: 7 (ref 5–15)
BUN: 11 mg/dL (ref 6–20)
CO2: 28 mmol/L (ref 22–32)
Calcium: 8.8 mg/dL — ABNORMAL LOW (ref 8.9–10.3)
Chloride: 104 mmol/L (ref 98–111)
Creatinine, Ser: 0.81 mg/dL (ref 0.61–1.24)
GFR, Estimated: >60 mL/min (ref >60)
Glucose, Bld: 193 mg/dL — ABNORMAL HIGH (ref 70–99)
Potassium: 4.6 mmol/L (ref 3.5–5.1)
Sodium: 139 mmol/L (ref 135–145)

## 2022-04-22 LAB — CBC WITH DIFFERENTIAL/PLATELET
Abs Immature Granulocytes: 0.06 10*3/uL (ref 0.00–0.07)
Basophils Absolute: 0 10*3/uL (ref 0.0–0.1)
Basophils Relative: 1 %
Eosinophils Absolute: 0.1 10*3/uL (ref 0.0–0.5)
Eosinophils Relative: 2 %
HCT: 47.8 % (ref 39.0–52.0)
Hemoglobin: 14.3 g/dL (ref 13.0–17.0)
Immature Granulocytes: 1 %
Lymphocytes Relative: 31 %
Lymphs Abs: 2.5 10*3/uL (ref 0.7–4.0)
MCH: 27.2 pg (ref 26.0–34.0)
MCHC: 29.9 g/dL — ABNORMAL LOW (ref 30.0–36.0)
MCV: 90.9 fL (ref 80.0–100.0)
Monocytes Absolute: 1.3 10*3/uL — ABNORMAL HIGH (ref 0.1–1.0)
Monocytes Relative: 16 %
Neutro Abs: 4.1 10*3/uL (ref 1.7–7.7)
Neutrophils Relative %: 49 %
Platelets: 227 10*3/uL (ref 150–400)
RBC: 5.26 MIL/uL (ref 4.22–5.81)
RDW: 13.2 % (ref 11.5–15.5)
WBC: 8.2 10*3/uL (ref 4.0–10.5)
nRBC: 0 % (ref 0.0–0.2)

## 2022-04-22 LAB — RESPIRATORY PANEL BY PCR

## 2022-04-22 LAB — CBC
HCT: 51.1 % (ref 39.0–52.0)
Hemoglobin: 15.4 g/dL (ref 13.0–17.0)
MCH: 27.3 pg (ref 26.0–34.0)
MCHC: 30.1 g/dL (ref 30.0–36.0)
MCV: 90.4 fL (ref 80.0–100.0)
Platelets: 242 10*3/uL (ref 150–400)
RBC: 5.65 MIL/uL (ref 4.22–5.81)
RDW: 13.2 % (ref 11.5–15.5)
WBC: 10.1 10*3/uL (ref 4.0–10.5)
nRBC: 0 % (ref 0.0–0.2)

## 2022-04-22 LAB — COMPREHENSIVE METABOLIC PANEL
ALT: 30 U/L (ref 0–44)
AST: 21 U/L (ref 15–41)
Albumin: 3.6 g/dL (ref 3.5–5.0)
Alkaline Phosphatase: 60 U/L (ref 38–126)
Anion gap: 7 (ref 5–15)
BUN: 12 mg/dL (ref 6–20)
CO2: 28 mmol/L (ref 22–32)
Calcium: 8.3 mg/dL — ABNORMAL LOW (ref 8.9–10.3)
Chloride: 106 mmol/L (ref 98–111)
Creatinine, Ser: 0.79 mg/dL (ref 0.61–1.24)
GFR, Estimated: >60 mL/min (ref >60)
Glucose, Bld: 127 mg/dL — ABNORMAL HIGH (ref 70–99)
Potassium: 3.5 mmol/L (ref 3.5–5.1)
Sodium: 141 mmol/L (ref 135–145)
Total Bilirubin: 0.5 mg/dL (ref 0.3–1.2)
Total Protein: 7.2 g/dL (ref 6.5–8.1)

## 2022-04-22 LAB — STREP PNEUMONIAE URINARY ANTIGEN: Strep Pneumo Urinary Antigen: NEGATIVE

## 2022-04-22 LAB — TROPONIN I (HIGH SENSITIVITY)
Troponin I (High Sensitivity): 10 ng/L (ref <18)
Troponin I (High Sensitivity): 13 ng/L (ref <18)

## 2022-04-22 LAB — SARS CORONAVIRUS 2 BY RT PCR: SARS Coronavirus 2 by RT PCR: NEGATIVE

## 2022-04-22 MED ORDER — LISINOPRIL 5 MG PO TABS
5.0000 mg | ORAL_TABLET | Freq: Every day | ORAL | Status: DC
  Administered 2022-04-22 – 2022-04-24 (×3): 5 mg via ORAL
  Filled 2022-04-22 (×3): qty 1

## 2022-04-22 MED ORDER — ENOXAPARIN SODIUM 80 MG/0.8ML IJ SOSY
0.5000 mg/kg | PREFILLED_SYRINGE | INTRAMUSCULAR | Status: DC
  Administered 2022-04-23 – 2022-04-24 (×2): 80 mg via SUBCUTANEOUS
  Filled 2022-04-22 (×3): qty 0.8

## 2022-04-22 MED ORDER — IOHEXOL 350 MG/ML SOLN
75.0000 mL | Freq: Once | INTRAVENOUS | Status: AC | PRN
  Administered 2022-04-22: 75 mL via INTRAVENOUS

## 2022-04-22 MED ORDER — LIDOCAINE 5 % EX PTCH
1.0000 | MEDICATED_PATCH | CUTANEOUS | Status: DC
  Administered 2022-04-22 – 2022-04-24 (×3): 1 via TRANSDERMAL
  Filled 2022-04-22 (×3): qty 1

## 2022-04-22 MED ORDER — BUTALBITAL-APAP-CAFFEINE 50-325-40 MG PO TABS
2.0000 | ORAL_TABLET | Freq: Once | ORAL | Status: AC
  Administered 2022-04-22: 2 via ORAL
  Filled 2022-04-22: qty 2

## 2022-04-22 MED ORDER — SODIUM CHLORIDE 0.9 % IV SOLN
2.0000 g | INTRAVENOUS | Status: DC
  Administered 2022-04-22 – 2022-04-24 (×3): 2 g via INTRAVENOUS
  Filled 2022-04-22: qty 2
  Filled 2022-04-22 (×2): qty 20

## 2022-04-22 MED ORDER — METHYLPREDNISOLONE SODIUM SUCC 125 MG IJ SOLR
125.0000 mg | Freq: Once | INTRAMUSCULAR | Status: AC
  Administered 2022-04-22: 125 mg via INTRAVENOUS
  Filled 2022-04-22: qty 2

## 2022-04-22 MED ORDER — ACETAMINOPHEN 325 MG PO TABS
650.0000 mg | ORAL_TABLET | Freq: Four times a day (QID) | ORAL | Status: DC | PRN
  Administered 2022-04-24: 650 mg via ORAL
  Filled 2022-04-22: qty 2

## 2022-04-22 MED ORDER — AMLODIPINE BESYLATE 5 MG PO TABS: 5.0000 mg | ORAL_TABLET | Freq: Every day | ORAL | Status: DC

## 2022-04-22 MED ORDER — NICOTINE 14 MG/24HR TD PT24
14.0000 mg | MEDICATED_PATCH | Freq: Every day | TRANSDERMAL | Status: DC
Start: 2022-04-22 — End: 2022-04-24
  Administered 2022-04-22 – 2022-04-24 (×3): 14 mg via TRANSDERMAL
  Filled 2022-04-22 (×3): qty 1

## 2022-04-22 MED ORDER — HYDRALAZINE HCL 20 MG/ML IJ SOLN: 20.0000 mg | Freq: Four times a day (QID) | INTRAMUSCULAR | Status: DC | PRN

## 2022-04-22 MED ORDER — BENZONATATE 100 MG PO CAPS
100.0000 mg | ORAL_CAPSULE | Freq: Three times a day (TID) | ORAL | Status: DC | PRN
Start: 2022-04-22 — End: 2022-04-24

## 2022-04-22 MED ORDER — ACETAMINOPHEN 650 MG RE SUPP: 650.0000 mg | Freq: Four times a day (QID) | RECTAL | Status: DC | PRN

## 2022-04-22 MED ORDER — OXYCODONE-ACETAMINOPHEN 5-325 MG PO TABS
1.0000 | ORAL_TABLET | Freq: Four times a day (QID) | ORAL | Status: DC | PRN
  Administered 2022-04-22 – 2022-04-23 (×2): 1 via ORAL
  Filled 2022-04-22 (×2): qty 1

## 2022-04-22 MED ORDER — IPRATROPIUM-ALBUTEROL 0.5-2.5 (3) MG/3ML IN SOLN
3.0000 mL | Freq: Four times a day (QID) | RESPIRATORY_TRACT | Status: DC
  Administered 2022-04-22 – 2022-04-23 (×6): 3 mL via RESPIRATORY_TRACT
  Filled 2022-04-22 (×6): qty 3

## 2022-04-22 MED ORDER — MORPHINE SULFATE (PF) 2 MG/ML IV SOLN: 1.0000 mg | INTRAVENOUS | Status: DC | PRN

## 2022-04-22 MED ORDER — TRAZODONE HCL 50 MG PO TABS: 25.0000 mg | ORAL_TABLET | Freq: Every evening | ORAL | Status: DC | PRN

## 2022-04-22 MED ORDER — ONDANSETRON HCL 4 MG PO TABS: 4.0000 mg | ORAL_TABLET | Freq: Four times a day (QID) | ORAL | Status: DC | PRN

## 2022-04-22 MED ORDER — IPRATROPIUM-ALBUTEROL 0.5-2.5 (3) MG/3ML IN SOLN
3.0000 mL | Freq: Once | RESPIRATORY_TRACT | Status: AC
  Administered 2022-04-22: 3 mL via RESPIRATORY_TRACT
  Filled 2022-04-22: qty 3

## 2022-04-22 MED ORDER — POTASSIUM CHLORIDE IN NACL 20-0.9 MEQ/L-% IV SOLN
INTRAVENOUS | Status: DC
  Filled 2022-04-22: qty 1000

## 2022-04-22 MED ORDER — DOXYCYCLINE HYCLATE 100 MG PO TABS
100.0000 mg | ORAL_TABLET | Freq: Once | ORAL | Status: AC
  Administered 2022-04-22: 100 mg via ORAL
  Filled 2022-04-22: qty 1

## 2022-04-22 MED ORDER — SODIUM CHLORIDE 0.9 % IV SOLN
500.0000 mg | INTRAVENOUS | Status: DC
  Administered 2022-04-22 – 2022-04-24 (×3): 500 mg via INTRAVENOUS
  Filled 2022-04-22 (×3): qty 5

## 2022-04-22 MED ORDER — ONDANSETRON HCL 4 MG/2ML IJ SOLN: 4.0000 mg | Freq: Four times a day (QID) | INTRAMUSCULAR | Status: DC | PRN

## 2022-04-22 MED ORDER — MAGNESIUM HYDROXIDE 400 MG/5ML PO SUSP: 30.0000 mL | Freq: Every day | ORAL | Status: DC | PRN

## 2022-04-22 MED ORDER — AMLODIPINE BESYLATE 10 MG PO TABS
10.0000 mg | ORAL_TABLET | Freq: Every day | ORAL | Status: DC
  Administered 2022-04-22 – 2022-04-24 (×3): 10 mg via ORAL
  Filled 2022-04-22: qty 1
  Filled 2022-04-22: qty 2
  Filled 2022-04-22: qty 1

## 2022-04-22 MED ORDER — LACTATED RINGERS IV BOLUS
1000.0000 mL | Freq: Once | INTRAVENOUS | Status: AC
  Administered 2022-04-22: 1000 mL via INTRAVENOUS

## 2022-04-22 NOTE — ED Notes (Signed)
Pt O2 saturation noted to drop to 60% on room air while sleeping with perfect waveform noted on monitor. Pt was easily aroused by RN and states that he is supposed to have a sleep study but has not yet scheduled the test, and he does not have a CPAP at home. Notified Dr. Katrinka Blazing of pt vitals and will continue to monitor. Pt is in no acute distress and O2 increased immediately to mid-90s% when pt was awake.

## 2022-04-22 NOTE — Assessment & Plan Note (Signed)
-   He was counseled for smoking cessation and will receive further counseling here. 

## 2022-04-22 NOTE — ED Notes (Signed)
RN to bedside to introduce self to pt. Pt sleeping with CPAP in place.

## 2022-04-22 NOTE — Progress Notes (Signed)
PROGRESS NOTE    Ryan Maxwell  DEY:[id number] DOB: Oct 16, 1985 DOA: 04/22/2022 PCP: Patient, No Pcp Per    Assessment & Plan:   Principal Problem:   CAP (community acquired pneumonia) Active Problems:   Acute respiratory failure with hypoxia (HCC)   Essential hypertension   Tobacco abuse  Assessment and Plan: CAP: likely atypical pneumonia as per CTA chest. Continue on IV rocephin, azithormycin. Strep, legionella, mycoplasma & viral resp PCR panel ordered. COVID19 is neg. Continue on bronchodilators & encourage incentive spirometry.   Acute hypoxic respiratory failure: found to be 73% on RA on 7/16. Continue on supplemental oxygen and wean as tolerated. Likely secondary to above    HTN: continue on lisinopril & increase dose of amlodipine. IV hydralazine prn    Tobacco abuse: smoking cessation counseling x 5 mins  Morbid obesity: BMI 47.4. Complicates overall care & prognosis. HbA1c ordered    DVT prophylaxis: lovenox  Code Status: full  Family Communication: Disposition Plan: likely d/c back home   Level of care: Telemetry Medical  Status is: Inpatient Remains inpatient appropriate because: severity of illness    Consultants:    Procedures:   Antimicrobials: rocephin, azithromycin    Subjective: Pt c/o shortness of breath.   Objective: Vitals:   04/22/22 0445 04/22/22 0500 04/22/22 0600 04/22/22 0700  BP: (!) 154/112 (!) 153/109 (!) 163/115 (!) 147/101  Pulse: 75 75 71 70  Resp: 12 20 (!) 22 20  Temp:      TempSrc:      SpO2: 100% 99% 100% 98%  Weight:      Height:       No intake or output data in the 24 hours ending 04/22/22 0831 Filed Weights   04/22/22 0057  Weight: (!) 158.8 kg    Examination:  General exam: Appears lethargic  Respiratory system: diminished breath sounds b/l  Cardiovascular system: S1 & S2+. No  rubs, gallops or clicks.  Gastrointestinal system: Abdomen is obese, soft and nontender.  Normal bowel sounds  heard. Central nervous system: Lethargic. Moves all extremities. Psychiatry: Judgement and insight appear normal. Flat mood and affect      Data Reviewed: I have personally reviewed following labs and imaging studies  CBC: Recent Labs  Lab 04/22/22 0054  WBC 8.2  NEUTROABS 4.1  HGB 14.3  HCT 47.8  MCV 90.9  PLT 227   Basic Metabolic Panel: Recent Labs  Lab 04/22/22 0054  NA 141  K 3.5  CL 106  CO2 28  GLUCOSE 127*  BUN 12  CREATININE 0.79  CALCIUM 8.3*   GFR: Estimated Creatinine Clearance: 202.6 mL/min (by C-G formula based on SCr of 0.79 mg/dL). Liver Function Tests: Recent Labs  Lab 04/22/22 0054  AST 21  ALT 30  ALKPHOS 60  BILITOT 0.5  PROT 7.2  ALBUMIN 3.6   No results for input(s): "LIPASE", "AMYLASE" in the last 168 hours. No results for input(s): "AMMONIA" in the last 168 hours. Coagulation Profile: No results for input(s): "INR", "PROTIME" in the last 168 hours. Cardiac Enzymes: No results for input(s): "CKTOTAL", "CKMB", "CKMBINDEX", "TROPONINI" in the last 168 hours. BNP (last 3 results) No results for input(s): "PROBNP" in the last 8760 hours. HbA1C: No results for input(s): "HGBA1C" in the last 72 hours. CBG: No results for input(s): "GLUCAP" in the last 168 hours. Lipid Profile: No results for input(s): "CHOL", "HDL", "LDLCALC", "TRIG", "CHOLHDL", "LDLDIRECT" in the last 72 hours. Thyroid Function Tests: No results for input(s): "TSH", "T4TOTAL", "FREET4", "T3FREE", "  THYROIDAB" in the last 72 hours. Anemia Panel: No results for input(s): "VITAMINB12", "FOLATE", "FERRITIN", "TIBC", "IRON", "RETICCTPCT" in the last 72 hours. Sepsis Labs: No results for input(s): "PROCALCITON", "LATICACIDVEN" in the last 168 hours.  Recent Results (from the past 240 hour(s))  SARS Coronavirus 2 by RT PCR (hospital order, performed in [facility] hospital lab) *cepheid single result test* Anterior Nasal Swab     Status: None   Collection Time: 04/22/22   2:55 AM   Specimen: Anterior Nasal Swab  Result Value Ref Range Status   SARS Coronavirus 2 by RT PCR NEGATIVE NEGATIVE Final    Comment: (NOTE) SARS-CoV-2 target nucleic acids are NOT DETECTED.  The SARS-CoV-2 RNA is generally detectable in upper and lower respiratory specimens during the acute phase of infection. The lowest concentration of SARS-CoV-2 viral copies this assay can detect is 250 copies / mL. A negative result does not preclude SARS-CoV-2 infection and should not be used as the sole basis for treatment or other patient management decisions.  A negative result may occur with improper specimen collection / handling, submission of specimen other than nasopharyngeal swab, presence of viral mutation(s) within the areas targeted by this assay, and inadequate number of viral copies (<250 copies / mL). A negative result must be combined with clinical observations, patient history, and epidemiological information.  Fact Sheet for Patients:   RoadLapTop.co.za  Fact Sheet for Healthcare Providers: http://kim-miller.com/  This test is not yet approved or  cleared by the [place] FDA and has been authorized for detection and/or diagnosis of SARS-CoV-2 by FDA under an Emergency Use Authorization (EUA).  This EUA will remain in effect (meaning this test can be used) for the duration of the COVID-19 declaration under Section 564(b)(1) of the Act, 21 U.S.C. section 360bbb-3(b)(1), unless the authorization is terminated or revoked sooner.  Performed at [facility], [address], [place], [place] [address]          Radiology Studies: CT Angio Chest PE W and/or Wo Contrast  Result Date: 04/22/2022 CLINICAL DATA:  Hypoxia and syncope. Shortness of breath. Evaluate for PE. EXAM: CT ANGIOGRAPHY CHEST WITH CONTRAST TECHNIQUE: Multidetector CT imaging of the chest was performed using the standard protocol during bolus  administration of intravenous contrast. Multiplanar CT image reconstructions and MIPs were obtained to evaluate the vascular anatomy. RADIATION DOSE REDUCTION: This exam was performed according to the departmental dose-optimization program which includes automated exposure control, adjustment of the mA and/or kV according to patient size and/or use of iterative reconstruction technique. CONTRAST:  73mL OMNIPAQUE IOHEXOL 350 MG/ML SOLN COMPARISON:  Chest radiograph dated 04/22/2022. Chest CT dated 03/07/2020. FINDINGS: Cardiovascular: There is no cardiomegaly or pericardial effusion. The thoracic aorta is unremarkable. The origins of the great vessels of the aortic arch appear patent. Evaluation of the pulmonary arteries is limited due to respiratory motion and suboptimal opacification and timing of the contrast. No pulmonary artery embolus identified. Mediastinum/Nodes: Bilateral hilar adenopathy measuring 17 mm short axis on the right. The esophagus is grossly unremarkable. No mediastinal fluid collection. Lungs/Pleura: Scattered clusters of nodular density primarily in the subpleural right upper lobe suspicious for atypical pneumonia. A cluster of nodular density in the left lung base may be sequela of prior infection or represent recurrent pneumonia. Clinical correlation and follow-up after treatment to resolution recommended. There is no pleural effusion or pneumothorax. The central airways are patent. Upper Abdomen: Fatty liver. Musculoskeletal: No chest wall abnormality. No acute or significant osseous findings. Review of  the MIP images confirms the above findings. IMPRESSION: 1. No CT evidence of pulmonary embolism. 2. Scattered clusters of nodular density primarily in the subpleural right upper lobe as well as along the lung bases suspicious for atypical pneumonia. Clinical correlation and follow-up after treatment to resolution recommended. 3. Bilateral hilar adenopathy, likely reactive. 4. Fatty liver.  Electronically Signed   By: Elgie Collard M.D.   On: 04/22/2022 01:59   DG Chest 2 View  Result Date: 04/22/2022 CLINICAL DATA:  Shortness of breath and productive cough. EXAM: CHEST - 2 VIEW COMPARISON:  12/01/2021 FINDINGS: Heart size upper normal.The cardiomediastinal contours are normal. Subsegmental opacities in the lingula typical of atelectasis. Pulmonary vasculature is normal. No consolidation, pleural effusion, or pneumothorax. No acute osseous abnormalities are seen. IMPRESSION: Subsegmental lingular atelectasis. Electronically Signed   By: Narda Rutherford M.D.   On: 04/22/2022 01:37   CT HEAD WO CONTRAST ( )  Result Date: 04/22/2022 CLINICAL DATA:  Headache, hypertension syncope.  Evaluate ICH EXAM: CT HEAD WITHOUT CONTRAST TECHNIQUE: Contiguous axial images were obtained from the base of the skull through the vertex without intravenous contrast. RADIATION DOSE REDUCTION: This exam was performed according to the departmental dose-optimization program which includes automated exposure control, adjustment of the mA and/or kV according to patient size and/or use of iterative reconstruction technique. COMPARISON:  Head CT 03/13/2021 FINDINGS: Brain: No intracranial hemorrhage, mass effect, or midline shift. No hydrocephalus. The basilar cisterns are patent. No evidence of territorial infarct or acute ischemia. No extra-axial or intracranial fluid collection. Vascular: No hyperdense vessel or unexpected calcification. Skull: No fracture or focal lesion. Sinuses/Orbits: Chronic mucous retention cyst in the central sphenoid sinus. No acute findings. Chronic calcification in the left supraorbital scalp soft tissues. Other: None. IMPRESSION: No acute intracranial abnormality. Electronically Signed   By: Narda Rutherford M.D.   On: 04/22/2022 01:35        Scheduled Meds:  amLODipine  5 mg Oral Daily   enoxaparin (LOVENOX) injection  0.5 mg/kg Subcutaneous Q24H   ipratropium-albuterol  3 mL  Nebulization QID   lisinopril  5 mg Oral Daily   Continuous Infusions:  0.9 % NaCl with KCl 20 mEq / L     azithromycin 500 mg (04/22/22 0410)   cefTRIAXone (ROCEPHIN)  IV Stopped (04/22/22 0408)     LOS: 0 days    Time spent: 35 mins    Charise Killian, MD [facility] Pager [phone]  If 7PM-7AM, please contact night-coverage [email or web address] 04/22/2022, 8:31 AM

## 2022-04-22 NOTE — Progress Notes (Signed)
Hospital owned CPAP set up in room. Patient and RN instructed to call RRT when patient is ready to go to sleep so RRT can place patient on machine.

## 2022-04-22 NOTE — Progress Notes (Signed)
PHARMACIST - PHYSICIAN COMMUNICATION  CONCERNING:  Enoxaparin (Lovenox) for DVT Prophylaxis    RECOMMENDATION: Patient was prescribed enoxaprin 40mg  q24 hours for VTE prophylaxis.   Filed Weights   04/22/22 0057  Weight: (!) 158.8 kg (350 lb)    Body mass index is 47.47 kg/m.  Estimated Creatinine Clearance: 202.6 mL/min (by C-G formula based on SCr of 0.79 mg/dL).   Based on [facility] policy patient is candidate for enoxaparin 0.5mg /kg TBW SQ every 24 hours based on BMI being >30.  DESCRIPTION: Pharmacy has adjusted enoxaparin dose per [facility] policy.  Patient is now receiving enoxaparin 0.5 mg/kg every 24 hours   [facility], PharmD, [facility] 04/22/2022 3:11 AM

## 2022-04-22 NOTE — ED Notes (Signed)
Pt has pulled his CPAP off to eat breakfast. Pt has hard time staying awake while RN is trying to speak to him. Pulse ox dropping to mid 80s without wearing oxygen. But when pt is aroused it improved to 96%/

## 2022-04-22 NOTE — ED Notes (Signed)
Pt oxygen in low 80s while awake. RN spoke with pt about risks of being hypoxic. Agreed to wear [place].

## 2022-04-22 NOTE — ED Notes (Signed)
Patient transported to CT 

## 2022-04-22 NOTE — H&P (Signed)
[facility]   PATIENT NAME: Ryan Maxwell    MR#:  [id number]  DATE OF BIRTH:  Dec 30, 1985  DATE OF ADMISSION:  04/22/2022  PRIMARY CARE PHYSICIAN: Patient, No Pcp Per   Patient is coming from: Home  REQUESTING/REFERRING PHYSICIAN: Delton Prairie, MD  CHIEF COMPLAINT:   Chief Complaint  Patient presents with   Headache    HISTORY OF PRESENT ILLNESS:  Ryan Maxwell is a 36 y.o. African-American male with medical history significant for essential hypertension, chronic bronchitis, ongoing tobacco abuse, who presented to the emergency room with acute onset of syncope.  History is a stated that he passed out briefly and his eye rolled back.  He has been having cough productive of yellowish sputum lately with mild dyspnea without wheezing.  He denies any fever or chills.  No nausea or vomiting or abdominal pain.  He has been having headache.  He has likely been diagnosed with sleep apnea but does not use a CPAP.  He has been having occasional midsternal chest pain felt as pressure without nausea or vomiting or diaphoresis or palpitations.  ED Course: When he came to the ER, BP was 161/104 with otherwise normal vital signs.  Pulse continues down to 82% on room given history of sleep apnea as placed on CPAP.  Labs revealed borderline potassium of 3.5 and calcium 8.3 with otherwise unremarkable CMP.  High sensitive troponin I was 10 and later 13.  CBC showed slightly low MCHC was otherwise unremarkable. EKG as reviewed by me : EKG showed sinus rhythm with a rate of 94 with Q waves anteroseptally and T wave inversion laterally. Imaging: 2 view chest x-ray showed subsegmental lingular atelectasis Chest CTA revealed no evidence for PE.  It showed scattered clusters of nodular density primarily in the subpleural right upper lobe as well as the lung bases suspicious for atypical pneumonia.  It showed bilateral hilar adenopathy likely reactive and fatty liver.  The patient was given 2  Fioricet, 100 mg p.o. doxycycline, DuoNeb, 1 L bolus of IV lactated Ringer 125 mg of IV Solu-Medrol.  He will be admitted to a medical telemetry bed for further evaluation and management PAST MEDICAL HISTORY:   Past Medical History:  Diagnosis Date   Acute respiratory failure with hypoxia (HCC) 03/06/2020   Bronchitis    Hypertension    Pneumonia    Tobacco abuse     PAST SURGICAL HISTORY:  He may have had a chest tube as a child for collapsed lung at age of 57.  SOCIAL HISTORY:   Social History   Tobacco Use   Smoking status: Every Day    Packs/day: 1.00    Types: Cigarettes   Smokeless tobacco: Never  Substance Use Topics   Alcohol use: Not Currently    FAMILY HISTORY:   Family History  Problem Relation Age of Onset   Hypertension Mother    Hypothyroidism Mother    Hypertension Other     DRUG ALLERGIES:   Allergies  Allergen Reactions   Dayquil [Pseudoephedrine-Apap-Dm] Shortness Of Breath   Nyquil Multi-Symptom [Pseudoeph-Doxylamine-Dm-Apap] Shortness Of Breath    REVIEW OF SYSTEMS:   ROS As per history of present illness. All pertinent systems were reviewed above. Constitutional, HEENT, cardiovascular, respiratory, GI, GU, musculoskeletal, neuro, psychiatric, endocrine, integumentary and hematologic systems were reviewed and are otherwise negative/unremarkable except for positive findings mentioned above in the HPI.   MEDICATIONS AT HOME:   Prior to Admission medications   Medication Sig Start  Date End Date Taking? Authorizing Provider  albuterol (VENTOLIN HFA) 108 (90 Base) MCG/ACT inhaler Inhale 2 puffs into the lungs every 6 (six) hours as needed for wheezing or shortness of breath. 11/21/21   Concha Se, MD  amLODipine (NORVASC) 5 MG tablet Take 1 tablet (5 mg total) by mouth daily. 07/18/21   Irean Hong, MD  benzonatate (TESSALON PERLES) 100 MG capsule Take 1 capsule (100 mg total) by mouth 3 (three) times daily as needed for cough. 11/21/21 11/21/22   Concha Se, MD  lisinopril (ZESTRIL) 5 MG tablet Take 1 tablet (5 mg total) by mouth daily. 03/11/20 11/21/21  ShahmehdiGemma Payor, MD      VITAL SIGNS:  Blood pressure (!) 159/112, pulse 74, temperature 98.6 F (37 C), temperature source Oral, resp. rate 18, height 6' (1.829 m), weight (!) 158.8 kg, SpO2 100 %.  PHYSICAL EXAMINATION:  Physical Exam  GENERAL:  36 y.o.-year-old African-American male patient lying in the bed with mild respiratory distress with conversational dyspnea, on BiPAP, somnolent but arousable. EYES: Pupils equal, round, reactive to light and accommodation. No scleral icterus. Extraocular muscles intact.  HEENT: Head atraumatic, normocephalic. Oropharynx and nasopharynx clear.  NECK:  Supple, no jugular venous distention. No thyroid enlargement, no tenderness.  LUNGS: Diminished bibasilar breath sounds with bibasal crackles and diminished right upper lobe breath sounds. No use of accessory muscles of respiration.  CARDIOVASCULAR: Regular rate and rhythm, S1, S2 normal. No murmurs, rubs, or gallops.  ABDOMEN: Soft, nondistended, nontender. Bowel sounds present. No organomegaly or mass.  EXTREMITIES: No pedal edema, cyanosis, or clubbing.  NEUROLOGIC: Cranial nerves II through XII are intact. Muscle strength 5/5 in all extremities. Sensation intact. Gait not checked.  PSYCHIATRIC: The patient is somnolent but arousable and cooperative.  No good eye contact.  SKIN: No obvious rash, lesion, or ulcer.   LABORATORY PANEL:   CBC Recent Labs  Lab 04/22/22 0054  WBC 8.2  HGB 14.3  HCT 47.8  PLT 227   ------------------------------------------------------------------------------------------------------------------  Chemistries  Recent Labs  Lab 04/22/22 0054  NA 141  K 3.5  CL 106  CO2 28  GLUCOSE 127*  BUN 12  CREATININE 0.79  CALCIUM 8.3*  AST 21  ALT 30  ALKPHOS 60  BILITOT 0.5    ------------------------------------------------------------------------------------------------------------------  Cardiac Enzymes No results for input(s): "TROPONINI" in the last 168 hours. ------------------------------------------------------------------------------------------------------------------  RADIOLOGY:  CT Angio Chest PE W and/or Wo Contrast  Result Date: 04/22/2022 CLINICAL DATA:  Hypoxia and syncope. Shortness of breath. Evaluate for PE. EXAM: CT ANGIOGRAPHY CHEST WITH CONTRAST TECHNIQUE: Multidetector CT imaging of the chest was performed using the standard protocol during bolus administration of intravenous contrast. Multiplanar CT image reconstructions and MIPs were obtained to evaluate the vascular anatomy. RADIATION DOSE REDUCTION: This exam was performed according to the departmental dose-optimization program which includes automated exposure control, adjustment of the mA and/or kV according to patient size and/or use of iterative reconstruction technique. CONTRAST:  1mL OMNIPAQUE IOHEXOL 350 MG/ML SOLN COMPARISON:  Chest radiograph dated 04/22/2022. Chest CT dated 03/07/2020. FINDINGS: Cardiovascular: There is no cardiomegaly or pericardial effusion. The thoracic aorta is unremarkable. The origins of the great vessels of the aortic arch appear patent. Evaluation of the pulmonary arteries is limited due to respiratory motion and suboptimal opacification and timing of the contrast. No pulmonary artery embolus identified. Mediastinum/Nodes: Bilateral hilar adenopathy measuring 17 mm short axis on the right. The esophagus is grossly unremarkable. No mediastinal fluid collection. Lungs/Pleura: Scattered clusters  of nodular density primarily in the subpleural right upper lobe suspicious for atypical pneumonia. A cluster of nodular density in the left lung base may be sequela of prior infection or represent recurrent pneumonia. Clinical correlation and follow-up after treatment to  resolution recommended. There is no pleural effusion or pneumothorax. The central airways are patent. Upper Abdomen: Fatty liver. Musculoskeletal: No chest wall abnormality. No acute or significant osseous findings. Review of the MIP images confirms the above findings. IMPRESSION: 1. No CT evidence of pulmonary embolism. 2. Scattered clusters of nodular density primarily in the subpleural right upper lobe as well as along the lung bases suspicious for atypical pneumonia. Clinical correlation and follow-up after treatment to resolution recommended. 3. Bilateral hilar adenopathy, likely reactive. 4. Fatty liver. Electronically Signed   By: Elgie Collard M.D.   On: 04/22/2022 01:59   DG Chest 2 View  Result Date: 04/22/2022 CLINICAL DATA:  Shortness of breath and productive cough. EXAM: CHEST - 2 VIEW COMPARISON:  12/01/2021 FINDINGS: Heart size upper normal.The cardiomediastinal contours are normal. Subsegmental opacities in the lingula typical of atelectasis. Pulmonary vasculature is normal. No consolidation, pleural effusion, or pneumothorax. No acute osseous abnormalities are seen. IMPRESSION: Subsegmental lingular atelectasis. Electronically Signed   By: Narda Rutherford M.D.   On: 04/22/2022 01:37   CT HEAD WO CONTRAST ( )  Result Date: 04/22/2022 CLINICAL DATA:  Headache, hypertension syncope.  Evaluate ICH EXAM: CT HEAD WITHOUT CONTRAST TECHNIQUE: Contiguous axial images were obtained from the base of the skull through the vertex without intravenous contrast. RADIATION DOSE REDUCTION: This exam was performed according to the departmental dose-optimization program which includes automated exposure control, adjustment of the mA and/or kV according to patient size and/or use of iterative reconstruction technique. COMPARISON:  Head CT 03/13/2021 FINDINGS: Brain: No intracranial hemorrhage, mass effect, or midline shift. No hydrocephalus. The basilar cisterns are patent. No evidence of territorial  infarct or acute ischemia. No extra-axial or intracranial fluid collection. Vascular: No hyperdense vessel or unexpected calcification. Skull: No fracture or focal lesion. Sinuses/Orbits: Chronic mucous retention cyst in the central sphenoid sinus. No acute findings. Chronic calcification in the left supraorbital scalp soft tissues. Other: None. IMPRESSION: No acute intracranial abnormality. Electronically Signed   By: Narda Rutherford M.D.   On: 04/22/2022 01:35      IMPRESSION AND PLAN:  Assessment and Plan: * CAP (community acquired pneumonia) - It seems like atypical pneumonia. - The patient will be admitted to a medical telemetry bed. - We will place him on IV Rocephin and Zithromax. - O2 protocol will be followed. - Bronchodilator therapy and mucolytic therapy will be provided. - We will follow blood cultures  Acute respiratory failure with hypoxia (HCC) - This query secondary to his pneumonia and obesity hypoventilation syndrome with sleep apnea. - We will continue him on CPAP. - We will manage his pneumonia as mentioned above.  Essential hypertension - We will continue his antihypertensives.  Tobacco abuse - He was counseled for smoking cessation and will receive further counseling here.     DVT prophylaxis: Lovenox.  Advanced Care Planning:  Code Status: full code.  Family Communication:  The plan of care was discussed in details with the patient (and family). I answered all questions. The patient agreed to proceed with the above mentioned plan. Further management will depend upon hospital course. Disposition Plan: Back to previous home environment Consults called: none.  All the records are reviewed and case discussed with ED provider.  Status is: Inpatient  At the time of the admission, it appears that the appropriate admission status for this patient is inpatient.  This is judged to be reasonable and necessary in order to provide the required intensity of service to  ensure the patient's safety given the presenting symptoms, physical exam findings and initial radiographic and laboratory data in the context of comorbid conditions.  The patient requires inpatient status due to high intensity of service, high risk of further deterioration and high frequency of surveillance required.  I certify that at the time of admission, it is my clinical judgment that the patient will require inpatient hospital care extending more than 2 midnights.                            Dispo: The patient is from: Home              Anticipated d/c is to: Home              Patient currently is not medically stable to d/c.              Difficult to place patient: No  Hannah Beat M.D on 04/22/2022 at 5:25 AM  [facility]   From 7 PM-7 AM, contact night-coverage [email or web address]  CC: Primary care physician; Patient, No Pcp Per

## 2022-04-22 NOTE — ED Triage Notes (Signed)
Via EMS from home c/o left-sided headache with left hearing loss x 2 hours. Pt reports LOC and hitting head on floor. Hx HTN noncompliant with meds x 2 weeks. 20g IV left AC. GCS 15 on arrival.

## 2022-04-22 NOTE — ED Notes (Signed)
Unable to complete orthostatic vitals at this time.

## 2022-04-22 NOTE — Assessment & Plan Note (Signed)
-   It seems like atypical pneumonia. - The patient will be admitted to a medical telemetry bed. - We will place him on IV Rocephin and Zithromax. - O2 protocol will be followed. - Bronchodilator therapy and mucolytic therapy will be provided. - We will follow blood cultures

## 2022-04-22 NOTE — ED Notes (Signed)
Respiratory at bedside to fit pt for CPAP

## 2022-04-22 NOTE — ED Notes (Signed)
Pt is sleeping and has placed CPAP back on his face at this time.

## 2022-04-22 NOTE — ED Notes (Signed)
Pt O2 saturation dropped to 42% on 3L while pt was asleep. When pt was awakened, O2 sat rapidly increased to 99%. MD notified. Will continue to monitor.

## 2022-04-22 NOTE — Assessment & Plan Note (Signed)
-   We will continue his antihypertensives. 

## 2022-04-22 NOTE — ED Notes (Signed)
Pt given phone per request

## 2022-04-22 NOTE — ED Provider Notes (Signed)
[facility] Provider Note    Event Date/Time   First MD Initiated Contact with Patient 04/22/22 0100     (approximate)   History   Headache   HPI  Ryan Maxwell is a 36 y.o. male who presents to the ED for evaluation of Headache   I reviewed 2/15 DC summary patient was admitted for acute bronchitis and mild hypoxia in the setting of tobacco abuse.  Otherwise history of morbid obesity, hypertension.   Patient presents to the ED for evaluation of headache, syncope, shortness of breath and cough.  Reports upper respiratory congestion and "bronchitis" for the past 3-5 days without fevers.  Does report increased sputum production.  Denies chest pain.  This evening, he reports walking out to his car at the request of his wife to get some milk out of the car when he developed a severe headache, about 2 hours prior to arrival, and "wound up on the floor."  He thinks he may have passed out.  He was able to get himself back up and is now reporting primarily headache and photophobia.   Physical Exam   Triage Vital Signs: ED Triage Vitals  Enc Vitals Group     BP 04/22/22 0056 (!) 161/104     Pulse Rate 04/22/22 0056 89     Resp 04/22/22 0056 15     Temp 04/22/22 0056 98.6 F (37 C)     Temp Source 04/22/22 0056 Oral     SpO2 04/22/22 0054 96 %     Weight 04/22/22 0057 (!) 350 lb (158.8 kg)     Height 04/22/22 0057 6' (1.829 m)     Head Circumference --      Peak Flow --      Pain Score 04/22/22 0057 8     Pain Loc --      Pain Edu? --      Excl. in GC? --     Most recent vital signs: Vitals:   04/22/22 0200 04/22/22 0230  BP: (!) 141/97 (!) 145/91  Pulse: 83 89  Resp: 19 (!) 26  Temp:    SpO2: 91% (!) 73%    General: Awake, no distress.  Photophobic.  Conversational. CV:  Good peripheral perfusion. RRR Resp:  Normal effort.  Faint end expiratory wheezes throughout. Abd:  No distention.  Soft and benign MSK:  No deformity noted.  No signs  of trauma Neuro:  No focal deficits appreciated. Cranial nerves II through XII intact 5/5 strength and sensation in all 4 extremities Other:     ED Results / Procedures / Treatments   Labs (all labs ordered are listed, but only abnormal results are displayed) Labs Reviewed  COMPREHENSIVE METABOLIC PANEL - Abnormal; Notable for the following components:      Result Value   Glucose, Bld 127 (*)    Calcium 8.3 (*)    All other components within normal limits  CBC WITH DIFFERENTIAL/PLATELET - Abnormal; Notable for the following components:   MCHC 29.9 (*)    Monocytes Absolute 1.3 (*)    All other components within normal limits  SARS CORONAVIRUS 2 BY RT PCR  BASIC METABOLIC PANEL  CBC  TROPONIN I (HIGH SENSITIVITY)  TROPONIN I (HIGH SENSITIVITY)    EKG Sinus rhythm with a rate of 94 bpm.  Normal axis and intervals.  QTc 469.  Nonspecific ST changes inferiorly and laterally without STEMI.  RADIOLOGY CXR interpreted by me without evidence of acute cardiopulmonary pathology.  CT head interpreted by me without evidence of acute intracranial pathology CTA chest interpreted by me without evidence of acute PE  Official radiology report(s): CT Angio Chest PE W and/or Wo Contrast  Result Date: 04/22/2022 CLINICAL DATA:  Hypoxia and syncope. Shortness of breath. Evaluate for PE. EXAM: CT ANGIOGRAPHY CHEST WITH CONTRAST TECHNIQUE: Multidetector CT imaging of the chest was performed using the standard protocol during bolus administration of intravenous contrast. Multiplanar CT image reconstructions and MIPs were obtained to evaluate the vascular anatomy. RADIATION DOSE REDUCTION: This exam was performed according to the departmental dose-optimization program which includes automated exposure control, adjustment of the mA and/or kV according to patient size and/or use of iterative reconstruction technique. CONTRAST:  68mL OMNIPAQUE IOHEXOL 350 MG/ML SOLN COMPARISON:  Chest radiograph dated  04/22/2022. Chest CT dated 03/07/2020. FINDINGS: Cardiovascular: There is no cardiomegaly or pericardial effusion. The thoracic aorta is unremarkable. The origins of the great vessels of the aortic arch appear patent. Evaluation of the pulmonary arteries is limited due to respiratory motion and suboptimal opacification and timing of the contrast. No pulmonary artery embolus identified. Mediastinum/Nodes: Bilateral hilar adenopathy measuring 17 mm short axis on the right. The esophagus is grossly unremarkable. No mediastinal fluid collection. Lungs/Pleura: Scattered clusters of nodular density primarily in the subpleural right upper lobe suspicious for atypical pneumonia. A cluster of nodular density in the left lung base may be sequela of prior infection or represent recurrent pneumonia. Clinical correlation and follow-up after treatment to resolution recommended. There is no pleural effusion or pneumothorax. The central airways are patent. Upper Abdomen: Fatty liver. Musculoskeletal: No chest wall abnormality. No acute or significant osseous findings. Review of the MIP images confirms the above findings. IMPRESSION: 1. No CT evidence of pulmonary embolism. 2. Scattered clusters of nodular density primarily in the subpleural right upper lobe as well as along the lung bases suspicious for atypical pneumonia. Clinical correlation and follow-up after treatment to resolution recommended. 3. Bilateral hilar adenopathy, likely reactive. 4. Fatty liver. Electronically Signed   By: Anner Crete M.D.   On: 04/22/2022 01:59   DG Chest 2 View  Result Date: 04/22/2022 CLINICAL DATA:  Shortness of breath and productive cough. EXAM: CHEST - 2 VIEW COMPARISON:  12/01/2021 FINDINGS: Heart size upper normal.The cardiomediastinal contours are normal. Subsegmental opacities in the lingula typical of atelectasis. Pulmonary vasculature is normal. No consolidation, pleural effusion, or pneumothorax. No acute osseous abnormalities  are seen. IMPRESSION: Subsegmental lingular atelectasis. Electronically Signed   By: Keith Rake M.D.   On: 04/22/2022 01:37   CT HEAD WO CONTRAST (5MM)  Result Date: 04/22/2022 CLINICAL DATA:  Headache, hypertension syncope.  Evaluate ICH EXAM: CT HEAD WITHOUT CONTRAST TECHNIQUE: Contiguous axial images were obtained from the base of the skull through the vertex without intravenous contrast. RADIATION DOSE REDUCTION: This exam was performed according to the departmental dose-optimization program which includes automated exposure control, adjustment of the mA and/or kV according to patient size and/or use of iterative reconstruction technique. COMPARISON:  Head CT 03/13/2021 FINDINGS: Brain: No intracranial hemorrhage, mass effect, or midline shift. No hydrocephalus. The basilar cisterns are patent. No evidence of territorial infarct or acute ischemia. No extra-axial or intracranial fluid collection. Vascular: No hyperdense vessel or unexpected calcification. Skull: No fracture or focal lesion. Sinuses/Orbits: Chronic mucous retention cyst in the central sphenoid sinus. No acute findings. Chronic calcification in the left supraorbital scalp soft tissues. Other: None. IMPRESSION: No acute intracranial abnormality. Electronically Signed   By: Aurther Loft.D.  On: 04/22/2022 01:35    PROCEDURES and INTERVENTIONS:  .1-3 Lead EKG Interpretation  Performed by: Vladimir Crofts, MD Authorized by: Vladimir Crofts, MD     Interpretation: normal     ECG rate:  86   ECG rate assessment: normal     Rhythm: sinus rhythm     Ectopy: none     Conduction: normal   .Critical Care  Performed by: Vladimir Crofts, MD Authorized by: Vladimir Crofts, MD   Critical care provider statement:    Critical care time (minutes):  30   Critical care time was exclusive of:  Separately billable procedures and treating other patients   Critical care was necessary to treat or prevent imminent or life-threatening deterioration  of the following conditions:  Respiratory failure   Critical care was time spent personally by me on the following activities:  Development of treatment plan with patient or surrogate, discussions with consultants, evaluation of patient's response to treatment, examination of patient, ordering and review of laboratory studies, ordering and review of radiographic studies, ordering and performing treatments and interventions, pulse oximetry, re-evaluation of patient's condition and review of old charts   Medications  amLODipine (NORVASC) tablet 5 mg (has no administration in time range)  lisinopril (ZESTRIL) tablet 5 mg (has no administration in time range)  benzonatate (TESSALON) capsule 100 mg (has no administration in time range)  ipratropium-albuterol (DUONEB) 0.5-2.5 (3) MG/3ML nebulizer solution 3 mL (has no administration in time range)  cefTRIAXone (ROCEPHIN) 2 g in sodium chloride 0.9 % 100 mL IVPB (2 g Intravenous New Bag/Given 04/22/22 0337)  azithromycin (ZITHROMAX) 500 mg in sodium chloride 0.9 % 250 mL IVPB (has no administration in time range)  enoxaparin (LOVENOX) injection 80 mg (has no administration in time range)  0.9 % NaCl with KCl 20 mEq/ L  infusion (has no administration in time range)  acetaminophen (TYLENOL) tablet 650 mg (has no administration in time range)    Or  acetaminophen (TYLENOL) suppository 650 mg (has no administration in time range)  traZODone (DESYREL) tablet 25 mg (has no administration in time range)  magnesium hydroxide (MILK OF MAGNESIA) suspension 30 mL (has no administration in time range)  ondansetron (ZOFRAN) tablet 4 mg (has no administration in time range)    Or  ondansetron (ZOFRAN) injection 4 mg (has no administration in time range)  butalbital-acetaminophen-caffeine (FIORICET) 50-325-40 MG per tablet 2 tablet (2 tablets Oral Given 04/22/22 0106)  lactated ringers bolus 1,000 mL (0 mLs Intravenous Stopped 04/22/22 0337)  ipratropium-albuterol  (DUONEB) 0.5-2.5 (3) MG/3ML nebulizer solution 3 mL (3 mLs Nebulization Given 04/22/22 0107)  methylPREDNISolone sodium succinate (SOLU-MEDROL) 125 mg/2 mL injection 125 mg (125 mg Intravenous Given 04/22/22 0107)  iohexol (OMNIPAQUE) 350 MG/ML injection 75 mL (75 mLs Intravenous Contrast Given 04/22/22 0151)  doxycycline (VIBRA-TABS) tablet 100 mg (100 mg Oral Given 04/22/22 0211)     IMPRESSION / MDM / [place] / ED COURSE  I reviewed the triage vital signs and the nursing notes.  Differential diagnosis includes, but is not limited to, stroke, seizure, dysrhythmia, PE, ACS, sepsis, pneumonia, ICH  {Patient presents with symptoms of an acute illness or injury that is potentially life-threatening.  Patient presents to the ED for evaluation of a bad headache after a syncopal episode, found of evidence of hypoxia likely related to untreated OSA and ultimately require medical admission.  Mildly hypertensive, but largely reassuring vital signs on arrival to the ED.  Headache resolved with Fioricet.  Reassuring examination without  evidence of neurologic deficits or significant trauma.  EKG with typical intervals without signs of cardiogenic syncope on this or telemetry.  Has some end expiratory wheezes improved with breathing treatments and steroids.  Blood work with essentially normal CBC and metabolic panel.  Negative troponin.  CXR without infiltrate or PTX and CT head is reassuring without signs of ICH.  Considering his hypoxia and syncope, CTA chest added on without evidence of acute PE. After recurrent desaturations into the 60s-70s with a good waveform while patient is sleeping, I discussed with him my concern about going home, as well as a syncopal episode and my recommendation for observation and social work consult in the morning to discuss try to get him a CPAP at home.  He is agreeable to stay.  We will order CPAP for this evening and consult with medicine.  Clinical Course as of  04/22/22 0341  Sun Apr 22, 2022  0134 Nurse informs me that patient has significant desaturation event to the 60s with a good waveform in the setting of his sleeping and snoring.  Patient admits that he has a history of sleep apnea and does not wear his CPAP.  Quickly resolves with awakening the patient.  [DS]    Clinical Course User Index [DS] Delton Prairie, MD     FINAL CLINICAL IMPRESSION(S) / ED DIAGNOSES   Final diagnoses:  Hypoxia  OSA (obstructive sleep apnea)  Bronchitis  Atypical pneumonia     Rx / DC Orders   ED Discharge Orders     None        Note:  This document was prepared using Dragon voice recognition software and may include unintentional dictation errors.   Delton Prairie, MD 04/22/22 [phone]

## 2022-04-22 NOTE — ED Notes (Signed)
Pt is complaint of pain. Offered PRN tylenol. Pt refused. Will make MD aware.

## 2022-04-22 NOTE — ED Notes (Signed)
Pt placed in hospital bed for comfort.

## 2022-04-22 NOTE — Assessment & Plan Note (Addendum)
-   This query secondary to his pneumonia and obesity hypoventilation syndrome with sleep apnea. - We will continue him on CPAP. - We will manage his pneumonia as mentioned above.

## 2022-04-23 DIAGNOSIS — J189 Pneumonia, unspecified organism: Secondary | ICD-10-CM | POA: Diagnosis not present

## 2022-04-23 DIAGNOSIS — J9601 Acute respiratory failure with hypoxia: Secondary | ICD-10-CM | POA: Diagnosis not present

## 2022-04-23 DIAGNOSIS — R7303 Prediabetes: Secondary | ICD-10-CM | POA: Diagnosis not present

## 2022-04-23 LAB — CBC
HCT: 49.2 % (ref 39.0–52.0)
Hemoglobin: 14.7 g/dL (ref 13.0–17.0)
MCH: 27 pg (ref 26.0–34.0)
MCHC: 29.9 g/dL — ABNORMAL LOW (ref 30.0–36.0)
MCV: 90.4 fL (ref 80.0–100.0)
Platelets: 253 10*3/uL (ref 150–400)
RBC: 5.44 MIL/uL (ref 4.22–5.81)
RDW: 13.2 % (ref 11.5–15.5)
WBC: 15.1 10*3/uL — ABNORMAL HIGH (ref 4.0–10.5)
nRBC: 0 % (ref 0.0–0.2)

## 2022-04-23 LAB — BASIC METABOLIC PANEL
Anion gap: 7 (ref 5–15)
BUN: 15 mg/dL (ref 6–20)
CO2: 29 mmol/L (ref 22–32)
Calcium: 8.8 mg/dL — ABNORMAL LOW (ref 8.9–10.3)
Chloride: 106 mmol/L (ref 98–111)
Creatinine, Ser: 0.79 mg/dL (ref 0.61–1.24)
GFR, Estimated: >60 mL/min (ref >60)
Glucose, Bld: 163 mg/dL — ABNORMAL HIGH (ref 70–99)
Potassium: 4.2 mmol/L (ref 3.5–5.1)
Sodium: 142 mmol/L (ref 135–145)

## 2022-04-23 LAB — HEMOGLOBIN A1C
Hgb A1c MFr Bld: 5.9 % — ABNORMAL HIGH (ref 4.8–5.6)
Mean Plasma Glucose: 122.63 mg/dL

## 2022-04-23 MED ORDER — IPRATROPIUM-ALBUTEROL 0.5-2.5 (3) MG/3ML IN SOLN
3.0000 mL | Freq: Three times a day (TID) | RESPIRATORY_TRACT | Status: DC
  Administered 2022-04-23: 3 mL via RESPIRATORY_TRACT
  Filled 2022-04-23: qty 3

## 2022-04-23 MED ORDER — ORAL CARE MOUTH RINSE: 15.0000 mL | OROMUCOSAL | Status: DC | PRN

## 2022-04-23 NOTE — TOC CM/SW Note (Addendum)
Per MD request, [employer] will review chart to see if patient qualifies for NIV.  Charlynn Court, CSW [phone]  2:49 pm: [employer] requesting a bedside pulmonary function test. Sent secure chat to MD to notify.  Charlynn Court, CSW [phone]

## 2022-04-23 NOTE — Progress Notes (Signed)
PROGRESS NOTE    Ryan Maxwell  WKM:[id number] DOB: Jun 03, 1986 DOA: 04/22/2022 PCP: Patient, No Pcp Per    Assessment & Plan:   Principal Problem:   CAP (community acquired pneumonia) Active Problems:   Acute respiratory failure with hypoxia (HCC)   Essential hypertension   Tobacco abuse  Assessment and Plan: CAP: likely atypical pneumonia as per CTA chest. Continue on IV rocephin, azithromycin. Strep is neg & viral respiratory PCR panel was neg. Legionella, mycoplasma are pending. COVID19 is neg. Continue on bronchodilators & encourage incentive spirometry    Acute hypoxic respiratory failure: found to be 73% on RA on 7/16. Likely secondary to above. Continue on supplemental oxygen and wean as tolerated.    HTN: continue on increase dose of amlodipine & continue on home dose of lisinopril. IV hydralazine prn     Tobacco abuse: smoking cessation counseling x 5 mins  Morbid obesity: BMI 47.4. Concern for OSA but pt needs an outpatient sleep study. CPAP qhs & w/ naps inpatient. Complicates overall care & prognosis  Pre-DM: HbA1c 5.9. Received pre-DM education. Pt verbalized his understanding     DVT prophylaxis: lovenox  Code Status: full  Family Communication: Disposition Plan: likely d/c back home   Level of care: Telemetry Medical  Status is: Inpatient Remains inpatient appropriate because: severity of illness    Consultants:    Procedures:   Antimicrobials: rocephin, azithromycin    Subjective: Pt c/o shortness of breath, improved from day prior   Objective: Vitals:   04/23/22 0030 04/23/22 0040 04/23/22 0046 04/23/22 0400  BP: (!) 139/97   (!) 151/100  Pulse: 94 96 94 90  Resp: 20 20 20 20   Temp: 98.2 F (36.8 C)   98 F (36.7 C)  TempSrc: Oral   Oral  SpO2: 97% 96% 95% 94%  Weight:      Height:        Intake/Output Summary (Last 24 hours) at 04/23/2022 0758 Last data filed at 04/23/2022 0454 Gross per 24 hour  Intake 939.09 ml  Output --   Net 939.09 ml   Filed Weights   04/22/22 0057  Weight: (!) 158.8 kg    Examination:  General exam: Appears calm & comfortable   Respiratory system: decreased breath sounds b/l  Cardiovascular system: S1/S2+. No gallops or rubs  Gastrointestinal system: Abd is soft, NT, obese & hypoactive bowel sounds  Central nervous system: Alert and oriented. Moves all extremities Psychiatry: judgement and insight appears normal. Appropriate mood and affect     Data Reviewed: I have personally reviewed following labs and imaging studies  CBC: Recent Labs  Lab 04/22/22 0054 04/22/22 0845 04/23/22 0438  WBC 8.2 10.1 15.1*  NEUTROABS 4.1  --   --   HGB 14.3 15.4 14.7  HCT 47.8 51.1 49.2  MCV 90.9 90.4 90.4  PLT 227 242 253   Basic Metabolic Panel: Recent Labs  Lab 04/22/22 0054 04/22/22 0845 04/23/22 0438  NA 141 139 142  K 3.5 4.6 4.2  CL 106 104 106  CO2 28 28 29   GLUCOSE 127* 193* 163*  BUN 12 11 15   CREATININE 0.79 0.81 0.79  CALCIUM 8.3* 8.8* 8.8*   GFR: Estimated Creatinine Clearance: 202.6 mL/min (by C-G formula based on SCr of 0.79 mg/dL). Liver Function Tests: Recent Labs  Lab 04/22/22 0054  AST 21  ALT 30  ALKPHOS 60  BILITOT 0.5  PROT 7.2  ALBUMIN 3.6   No results for input(s): "LIPASE", "AMYLASE" in the last  168 hours. No results for input(s): "AMMONIA" in the last 168 hours. Coagulation Profile: No results for input(s): "INR", "PROTIME" in the last 168 hours. Cardiac Enzymes: No results for input(s): "CKTOTAL", "CKMB", "CKMBINDEX", "TROPONINI" in the last 168 hours. BNP (last 3 results) No results for input(s): "PROBNP" in the last 8760 hours. HbA1C: No results for input(s): "HGBA1C" in the last 72 hours. CBG: No results for input(s): "GLUCAP" in the last 168 hours. Lipid Profile: No results for input(s): "CHOL", "HDL", "LDLCALC", "TRIG", "CHOLHDL", "LDLDIRECT" in the last 72 hours. Thyroid Function Tests: No results for input(s): "TSH",  "T4TOTAL", "FREET4", "T3FREE", "THYROIDAB" in the last 72 hours. Anemia Panel: No results for input(s): "VITAMINB12", "FOLATE", "FERRITIN", "TIBC", "IRON", "RETICCTPCT" in the last 72 hours. Sepsis Labs: No results for input(s): "PROCALCITON", "LATICACIDVEN" in the last 168 hours.  Recent Results (from the past 240 hour(s))  SARS Coronavirus 2 by RT PCR (hospital order, performed in [facility] hospital lab) *cepheid single result test* Anterior Nasal Swab     Status: None   Collection Time: 04/22/22  2:55 AM   Specimen: Anterior Nasal Swab  Result Value Ref Range Status   SARS Coronavirus 2 by RT PCR NEGATIVE NEGATIVE Final    Comment: (NOTE) SARS-CoV-2 target nucleic acids are NOT DETECTED.  The SARS-CoV-2 RNA is generally detectable in upper and lower respiratory specimens during the acute phase of infection. The lowest concentration of SARS-CoV-2 viral copies this assay can detect is 250 copies / mL. A negative result does not preclude SARS-CoV-2 infection and should not be used as the sole basis for treatment or other patient management decisions.  A negative result may occur with improper specimen collection / handling, submission of specimen other than nasopharyngeal swab, presence of viral mutation(s) within the areas targeted by this assay, and inadequate number of viral copies (<250 copies / mL). A negative result must be combined with clinical observations, patient history, and epidemiological information.  Fact Sheet for Patients:   RoadLapTop.co.za  Fact Sheet for Healthcare Providers: http://kim-miller.com/  This test is not yet approved or  cleared by the [place] FDA and has been authorized for detection and/or diagnosis of SARS-CoV-2 by FDA under an Emergency Use Authorization (EUA).  This EUA will remain in effect (meaning this test can be used) for the duration of the COVID-19 declaration under Section 564(b)(1)  of the Act, 21 U.S.C. section 360bbb-3(b)(1), unless the authorization is terminated or revoked sooner.  Performed at [facility], [address] [place], [place] [address]   Respiratory (~20 pathogens) panel by PCR     Status: None   Collection Time: 04/22/22  1:21 PM   Specimen: Nasopharyngeal Swab; Respiratory  Result Value Ref Range Status   Adenovirus NOT DETECTED NOT DETECTED Final   Coronavirus 229E NOT DETECTED NOT DETECTED Final    Comment: (NOTE) The Coronavirus on the Respiratory Panel, DOES NOT test for the novel  Coronavirus (2019 nCoV)    Coronavirus HKU1 NOT DETECTED NOT DETECTED Final   Coronavirus NL63 NOT DETECTED NOT DETECTED Final   Coronavirus OC43 NOT DETECTED NOT DETECTED Final   Metapneumovirus NOT DETECTED NOT DETECTED Final   Rhinovirus / Enterovirus NOT DETECTED NOT DETECTED Final   Influenza A NOT DETECTED NOT DETECTED Final   Influenza B NOT DETECTED NOT DETECTED Final   Parainfluenza Virus 1 NOT DETECTED NOT DETECTED Final   Parainfluenza Virus 2 NOT DETECTED NOT DETECTED Final   Parainfluenza Virus 3 NOT DETECTED NOT DETECTED Final  Parainfluenza Virus 4 NOT DETECTED NOT DETECTED Final   Respiratory Syncytial Virus NOT DETECTED NOT DETECTED Final   Bordetella pertussis NOT DETECTED NOT DETECTED Final   Bordetella Parapertussis NOT DETECTED NOT DETECTED Final   Chlamydophila pneumoniae NOT DETECTED NOT DETECTED Final   Mycoplasma pneumoniae NOT DETECTED NOT DETECTED Final    Comment: Performed at [facility], [address] [place], [place] [address]         Radiology Studies: CT Angio Chest PE W and/or Wo Contrast  Result Date: 04/22/2022 CLINICAL DATA:  Hypoxia and syncope. Shortness of breath. Evaluate for PE. EXAM: CT ANGIOGRAPHY CHEST WITH CONTRAST TECHNIQUE: Multidetector CT imaging of the chest was performed using the standard protocol during bolus administration of intravenous contrast. Multiplanar CT image  reconstructions and MIPs were obtained to evaluate the vascular anatomy. RADIATION DOSE REDUCTION: This exam was performed according to the departmental dose-optimization program which includes automated exposure control, adjustment of the mA and/or kV according to patient size and/or use of iterative reconstruction technique. CONTRAST:  77mL OMNIPAQUE IOHEXOL 350 MG/ML SOLN COMPARISON:  Chest radiograph dated 04/22/2022. Chest CT dated 03/07/2020. FINDINGS: Cardiovascular: There is no cardiomegaly or pericardial effusion. The thoracic aorta is unremarkable. The origins of the great vessels of the aortic arch appear patent. Evaluation of the pulmonary arteries is limited due to respiratory motion and suboptimal opacification and timing of the contrast. No pulmonary artery embolus identified. Mediastinum/Nodes: Bilateral hilar adenopathy measuring 17 mm short axis on the right. The esophagus is grossly unremarkable. No mediastinal fluid collection. Lungs/Pleura: Scattered clusters of nodular density primarily in the subpleural right upper lobe suspicious for atypical pneumonia. A cluster of nodular density in the left lung base may be sequela of prior infection or represent recurrent pneumonia. Clinical correlation and follow-up after treatment to resolution recommended. There is no pleural effusion or pneumothorax. The central airways are patent. Upper Abdomen: Fatty liver. Musculoskeletal: No chest wall abnormality. No acute or significant osseous findings. Review of the MIP images confirms the above findings. IMPRESSION: 1. No CT evidence of pulmonary embolism. 2. Scattered clusters of nodular density primarily in the subpleural right upper lobe as well as along the lung bases suspicious for atypical pneumonia. Clinical correlation and follow-up after treatment to resolution recommended. 3. Bilateral hilar adenopathy, likely reactive. 4. Fatty liver. Electronically Signed   By: Elgie Collard M.D.   On:  04/22/2022 01:59   DG Chest 2 View  Result Date: 04/22/2022 CLINICAL DATA:  Shortness of breath and productive cough. EXAM: CHEST - 2 VIEW COMPARISON:  12/01/2021 FINDINGS: Heart size upper normal.The cardiomediastinal contours are normal. Subsegmental opacities in the lingula typical of atelectasis. Pulmonary vasculature is normal. No consolidation, pleural effusion, or pneumothorax. No acute osseous abnormalities are seen. IMPRESSION: Subsegmental lingular atelectasis. Electronically Signed   By: Narda Rutherford M.D.   On: 04/22/2022 01:37   CT HEAD WO CONTRAST ( )  Result Date: 04/22/2022 CLINICAL DATA:  Headache, hypertension syncope.  Evaluate ICH EXAM: CT HEAD WITHOUT CONTRAST TECHNIQUE: Contiguous axial images were obtained from the base of the skull through the vertex without intravenous contrast. RADIATION DOSE REDUCTION: This exam was performed according to the departmental dose-optimization program which includes automated exposure control, adjustment of the mA and/or kV according to patient size and/or use of iterative reconstruction technique. COMPARISON:  Head CT 03/13/2021 FINDINGS: Brain: No intracranial hemorrhage, mass effect, or midline shift. No hydrocephalus. The basilar cisterns are patent. No evidence of territorial infarct or acute ischemia. No  extra-axial or intracranial fluid collection. Vascular: No hyperdense vessel or unexpected calcification. Skull: No fracture or focal lesion. Sinuses/Orbits: Chronic mucous retention cyst in the central sphenoid sinus. No acute findings. Chronic calcification in the left supraorbital scalp soft tissues. Other: None. IMPRESSION: No acute intracranial abnormality. Electronically Signed   By: Narda Rutherford M.D.   On: 04/22/2022 01:35        Scheduled Meds:  amLODipine  10 mg Oral Daily   enoxaparin (LOVENOX) injection  0.5 mg/kg Subcutaneous Q24H   ipratropium-albuterol  3 mL Nebulization QID   lidocaine  1 patch Transdermal Q24H    lisinopril  5 mg Oral Daily   nicotine  14 mg Transdermal Daily   Continuous Infusions:  azithromycin Stopped (04/23/22 0446)   cefTRIAXone (ROCEPHIN)  IV Stopped (04/23/22 0322)     LOS: 1 day    Time spent: 33 mins    Charise Killian, MD [facility] Pager [phone]  If 7PM-7AM, please contact night-coverage [email or web address] 04/23/2022, 7:58 AM

## 2022-04-23 NOTE — Progress Notes (Signed)
Spirometry best of 3. Pt gave good effort. FVC  1.85, 38.7% of predicted FEV1 1.43,36.1% of predicted FEV1/FVC 77.2, 93% of predicted Hard copy is in the chart.

## 2022-04-23 NOTE — Progress Notes (Signed)
Pt scored a 6 on the RT protocol assessment. His breath sounds are clear and diminished. The duoneb nebulizer treatments are to be changed to TID per protocol.

## 2022-04-24 DIAGNOSIS — J9601 Acute respiratory failure with hypoxia: Secondary | ICD-10-CM | POA: Diagnosis not present

## 2022-04-24 DIAGNOSIS — J189 Pneumonia, unspecified organism: Secondary | ICD-10-CM | POA: Diagnosis not present

## 2022-04-24 LAB — BASIC METABOLIC PANEL
Anion gap: 6 (ref 5–15)
BUN: 13 mg/dL (ref 6–20)
CO2: 30 mmol/L (ref 22–32)
Calcium: 8.7 mg/dL — ABNORMAL LOW (ref 8.9–10.3)
Chloride: 104 mmol/L (ref 98–111)
Creatinine, Ser: 0.8 mg/dL (ref 0.61–1.24)
GFR, Estimated: >60 mL/min (ref >60)
Glucose, Bld: 102 mg/dL — ABNORMAL HIGH (ref 70–99)
Potassium: 4.2 mmol/L (ref 3.5–5.1)
Sodium: 140 mmol/L (ref 135–145)

## 2022-04-24 LAB — CBC
HCT: 51.4 % (ref 39.0–52.0)
Hemoglobin: 15.8 g/dL (ref 13.0–17.0)
MCH: 27.7 pg (ref 26.0–34.0)
MCHC: 30.7 g/dL (ref 30.0–36.0)
MCV: 90.2 fL (ref 80.0–100.0)
Platelets: 251 10*3/uL (ref 150–400)
RBC: 5.7 MIL/uL (ref 4.22–5.81)
RDW: 13.4 % (ref 11.5–15.5)
WBC: 11.4 10*3/uL — ABNORMAL HIGH (ref 4.0–10.5)
nRBC: 0 % (ref 0.0–0.2)

## 2022-04-24 LAB — MYCOPLASMA PNEUMONIAE ANTIBODY, IGM: Mycoplasma pneumo IgM: <770 U/mL (ref 0–769)

## 2022-04-24 MED ORDER — AMLODIPINE BESYLATE 10 MG PO TABS: 10.0000 mg | ORAL_TABLET | Freq: Every day | ORAL | 0 refills | Status: DC

## 2022-04-24 MED ORDER — AZITHROMYCIN 500 MG PO TABS: 500.0000 mg | ORAL_TABLET | Freq: Every day | ORAL | 0 refills | Status: AC

## 2022-04-24 MED ORDER — AZITHROMYCIN 250 MG PO TABS: 500.0000 mg | ORAL_TABLET | Freq: Every day | ORAL | Status: DC

## 2022-04-24 MED ORDER — IPRATROPIUM-ALBUTEROL 0.5-2.5 (3) MG/3ML IN SOLN: 3.0000 mL | RESPIRATORY_TRACT | Status: DC | PRN

## 2022-04-24 NOTE — Progress Notes (Signed)
Discharge instructions, RX's and follow up appts explained and provided to patient verbalized understanding. See SW previous notes regarding NIV equipment from [employer]. Patient had already spoken with [employer] rep and has contact information.   \Clarann Helvey, Kae Heller, RN

## 2022-04-24 NOTE — TOC CM/SW Note (Signed)
[employer] representative said patient does not need to wait on NIV to discharge. They have started insurance authorization and will follow up with him outpatient. No further concerns. CSW signing off.  Charlynn Court, CSW [phone]

## 2022-04-24 NOTE — Plan of Care (Signed)

## 2022-04-24 NOTE — Discharge Summary (Signed)
Physician Discharge Summary  Ryan Maxwell UEA:[id number] DOB: 07/28/1986 DOA: 04/22/2022  PCP: Ryan Maxwell  Admit date: 04/22/2022 Discharge date: 04/24/2022  Admitted From: home  Disposition:  home   Recommendations for Outpatient Follow-up:  Follow up with PCP in 1-2 weeks   Home Health: no  Equipment/Devices:  Discharge Condition: stable  CODE STATUS: full  Diet recommendation: Heart Healthy / Carb Modified   Brief/Interim Summary: HPI was taken from Ryan Maxwell: Ryan BastaJermaine D Maxwell is a 36 y.o. African-American male with medical history significant for essential hypertension, chronic bronchitis, ongoing tobacco abuse, who presented to the emergency room with acute onset of syncope.  History is a stated that he passed out briefly and his eye rolled back.  He has been having cough productive of yellowish sputum lately with mild dyspnea without wheezing.  He denies any fever or chills.  No nausea or vomiting or abdominal pain.  He has been having headache.  He has likely been diagnosed with sleep apnea but does not use a CPAP.  He has been having occasional midsternal chest pain felt as pressure without nausea or vomiting or diaphoresis or palpitations.  ED Course: When he came to the ER, BP was 161/104 with otherwise normal vital signs.  Pulse continues down to 82% on room given history of sleep apnea as placed on CPAP.  Labs revealed borderline potassium of 3.5 and calcium 8.3 with otherwise unremarkable CMP.  High sensitive troponin I was 10 and later 13.  CBC showed slightly low MCHC was otherwise unremarkable. EKG as reviewed by me : EKG showed sinus rhythm with a rate of 94 with Q waves anteroseptally and T wave inversion laterally. Imaging: 2 view chest x-ray showed subsegmental lingular atelectasis Chest CTA revealed no evidence for PE.  It showed scattered clusters of nodular density primarily in the subpleural right upper lobe as well as the lung bases suspicious for  atypical pneumonia.  It showed bilateral hilar adenopathy likely reactive and fatty liver.  The patient was given 2 Fioricet, 100 mg p.o. doxycycline, DuoNeb, 1 L bolus of IV lactated Ringer 125 mg of IV Solu-Medrol.  He will be admitted to a medical telemetry bed for further evaluation and management  As Maxwell Ryan Maxwell 7/16-7/18/23: Pt was found to have pneumonia and was treated w/ IV rocephin, azithromycin, bronchodilators and supplemental oxygen. Pt was able to be weaned from supplemental oxygen prior to d/c. Of note, pt was using CPAP qhs and w/ naps for likely OSA. Pt has not had a sleep study but would benefit from one. Pt was ambulating and transferring independently so therapy was not indicated.   Discharge Diagnoses:  Principal Problem:   CAP (community acquired pneumonia) Active Problems:   Acute respiratory failure with hypoxia (HCC)   Essential hypertension   Tobacco abuse  CAP: likely atypical pneumonia as Maxwell CTA chest. Continue on IV rocephin, azithromycin. Strep is neg & viral respiratory PCR panel was neg. Legionella, mycoplasma are pending. COVID19 is neg. Continue on bronchodilators & encourage incentive spirometry    Acute hypoxic respiratory failure: found to be 73% on RA on 7/16. Likely secondary to above. Weaned off of supplemental oxygen    HTN: continue on increase dose of amlodipine & continue on home dose of lisinopril. IV hydralazine prn     Tobacco abuse: smoking cessation counseling x 5 mins  Morbid obesity: BMI 47.4. Concern for OSA but pt needs an outpatient sleep study. 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  Wound benefit from NIV. Complicates overall care & prognosis   Pre-DM: HbA1c 5.9. Received pre-DM education. Pt verbalized his understanding   Discharge Instructions  Discharge Instructions     Diet - low sodium heart healthy   Complete by: As directed    Diet Carb Modified   Complete by: As directed    Discharge instructions    Complete by: As directed    F/u w/ PCP in 1-2 weeks   Increase activity slowly   Complete by: As directed       Allergies as of 04/24/2022       Reactions   Dayquil [pseudoephedrine-apap-dm] Shortness Of Breath   Nyquil Multi-symptom [pseudoeph-doxylamine-dm-apap] Shortness Of Breath        Medication List     TAKE these medications    amLODipine 10 MG tablet Commonly known as: NORVASC Take 1 tablet (10 mg total) by mouth daily. Start taking on: April 25, 2022 What changed:  medication strength how much to take   azithromycin 500 MG tablet Commonly known as: ZITHROMAX Take 1 tablet (500 mg total) by mouth daily for 2 days.   lisinopril 5 MG tablet Commonly known as: ZESTRIL Take 1 tablet (5 mg total) by mouth daily.        Allergies  Allergen Reactions   Dayquil [Pseudoephedrine-Apap-Dm] Shortness Of Breath   Nyquil Multi-Symptom [Pseudoeph-Doxylamine-Dm-Apap] Shortness Of Breath    Consultations:    Procedures/Studies: CT Angio Chest PE W and/or Wo Contrast  Result Date: 04/22/2022 CLINICAL DATA:  Hypoxia and syncope. Shortness of breath. Evaluate for PE. EXAM: CT ANGIOGRAPHY CHEST WITH CONTRAST TECHNIQUE: Multidetector CT imaging of the chest was performed using the standard protocol during bolus administration of intravenous contrast. Multiplanar CT image reconstructions and MIPs were obtained to evaluate the vascular anatomy. RADIATION DOSE REDUCTION: This exam was performed according to the departmental dose-optimization program which includes automated exposure control, adjustment of the mA and/or kV according to patient size and/or use of iterative reconstruction technique. CONTRAST:  46mL OMNIPAQUE IOHEXOL 350 MG/ML SOLN COMPARISON:  Chest radiograph dated 04/22/2022. Chest CT dated 03/07/2020. FINDINGS: Cardiovascular: There is no cardiomegaly or pericardial effusion. The thoracic aorta is unremarkable. The origins of the great vessels of the aortic arch  appear patent. Evaluation of the pulmonary arteries is limited due to respiratory motion and suboptimal opacification and timing of the contrast. No pulmonary artery embolus identified. Mediastinum/Nodes: Bilateral hilar adenopathy measuring 17 mm short axis on the right. The esophagus is grossly unremarkable. No mediastinal fluid collection. Lungs/Pleura: Scattered clusters of nodular density primarily in the subpleural right upper lobe suspicious for atypical pneumonia. A cluster of nodular density in the left lung base may be sequela of prior infection or represent recurrent pneumonia. Clinical correlation and follow-up after treatment to resolution recommended. There is no pleural effusion or pneumothorax. The central airways are patent. Upper Abdomen: Fatty liver. Musculoskeletal: No chest wall abnormality. No acute or significant osseous findings. Review of the MIP images confirms the above findings. IMPRESSION: 1. No CT evidence of pulmonary embolism. 2. Scattered clusters of nodular density primarily in the subpleural right upper lobe as well as along the lung bases suspicious for atypical pneumonia. Clinical correlation and follow-up after treatment to resolution recommended. 3. Bilateral hilar adenopathy, likely reactive. 4. Fatty liver. Electronically Signed   By: Elgie Collard M.D.   On: 04/22/2022 01:59   DG Chest 2 View  Result Date: 04/22/2022 CLINICAL DATA:  Shortness of breath and productive cough. EXAM: CHEST -  2 VIEW COMPARISON:  12/01/2021 FINDINGS: Heart size upper normal.The cardiomediastinal contours are normal. Subsegmental opacities in the lingula typical of atelectasis. Pulmonary vasculature is normal. No consolidation, pleural effusion, or pneumothorax. No acute osseous abnormalities are seen. IMPRESSION: Subsegmental lingular atelectasis. Electronically Signed   By: Narda Rutherford M.D.   On: 04/22/2022 01:37   CT HEAD WO CONTRAST ( )  Result Date: 04/22/2022 CLINICAL DATA:   Headache, hypertension syncope.  Evaluate ICH EXAM: CT HEAD WITHOUT CONTRAST TECHNIQUE: Contiguous axial images were obtained from the base of the skull through the vertex without intravenous contrast. RADIATION DOSE REDUCTION: This exam was performed according to the departmental dose-optimization program which includes automated exposure control, adjustment of the mA and/or kV according to patient size and/or use of iterative reconstruction technique. COMPARISON:  Head CT 03/13/2021 FINDINGS: Brain: No intracranial hemorrhage, mass effect, or midline shift. No hydrocephalus. The basilar cisterns are patent. No evidence of territorial infarct or acute ischemia. No extra-axial or intracranial fluid collection. Vascular: No hyperdense vessel or unexpected calcification. Skull: No fracture or focal lesion. Sinuses/Orbits: Chronic mucous retention cyst in the central sphenoid sinus. No acute findings. Chronic calcification in the left supraorbital scalp soft tissues. Other: None. IMPRESSION: No acute intracranial abnormality. Electronically Signed   By: Narda Rutherford M.D.   On: 04/22/2022 01:35   (Echo, Carotid, EGD, Colonoscopy, ERCP)    Subjective: Pt c/o malaise    Discharge Exam: Vitals:   04/24/22 0909 04/24/22 1144  BP: (!) 159/121 (!) 142/97  Pulse: 95 91  Resp:  18  Temp:  97.8 F (36.6 C)  SpO2: 90% 94%   Vitals:   04/24/22 0905 04/24/22 0907 04/24/22 0909 04/24/22 1144  BP: (!) 166/104 (!) 151/111 (!) 159/121 (!) 142/97  Pulse: 89 90 95 91  Resp:    18  Temp:    97.8 F (36.6 C)  TempSrc:      SpO2: 92% 92% 90% 94%  Weight:      Height:        General: Pt is alert, awake, not in acute distress Cardiovascular: S1/S2 +, no rubs, no gallops Respiratory: decreased breath sounds b/l  Abdominal: Soft, NT, obese, bowel sounds + Extremities: no cyanosis    The results of significant diagnostics from this hospitalization (including imaging, microbiology, ancillary and  laboratory) are listed below for reference.     Microbiology: Recent Results (from the past 240 hour(s))  SARS Coronavirus 2 by RT PCR (hospital order, performed in [facility] hospital lab) *cepheid single result test* Anterior Nasal Swab     Status: None   Collection Time: 04/22/22  2:55 AM   Specimen: Anterior Nasal Swab  Result Value Ref Range Status   SARS Coronavirus 2 by RT PCR NEGATIVE NEGATIVE Final    Comment: (NOTE) SARS-CoV-2 target nucleic acids are NOT DETECTED.  The SARS-CoV-2 RNA is generally detectable in upper and lower respiratory specimens during the acute phase of infection. The lowest concentration of SARS-CoV-2 viral copies this assay can detect is 250 copies / mL. A negative result does not preclude SARS-CoV-2 infection and should not be used as the sole basis for treatment or other patient management decisions.  A negative result may occur with improper specimen collection / handling, submission of specimen other than nasopharyngeal swab, presence of viral mutation(s) within the areas targeted by this assay, and inadequate number of viral copies (<250 copies / mL). A negative result must be combined with clinical observations, patient history, and epidemiological information.  Fact Sheet for Patients:   RoadLapTop.co.za  Fact Sheet for Healthcare Providers: http://kim-miller.com/  This test is not yet approved or  cleared by the [place] FDA and has been authorized for detection and/or diagnosis of SARS-CoV-2 by FDA under an Emergency Use Authorization (EUA).  This EUA will remain in effect (meaning this test can be used) for the duration of the COVID-19 declaration under Section 564(b)(1) of the Act, 21 U.S.C. section 360bbb-3(b)(1), unless the authorization is terminated or revoked sooner.  Performed at [facility], [address] [place], [place] [address]   Respiratory (~20 pathogens)  panel by PCR     Status: None   Collection Time: 04/22/22  1:21 PM   Specimen: Nasopharyngeal Swab; Respiratory  Result Value Ref Range Status   Adenovirus NOT DETECTED NOT DETECTED Final   Coronavirus 229E NOT DETECTED NOT DETECTED Final    Comment: (NOTE) The Coronavirus on the Respiratory Panel, DOES NOT test for the novel  Coronavirus (2019 nCoV)    Coronavirus HKU1 NOT DETECTED NOT DETECTED Final   Coronavirus NL63 NOT DETECTED NOT DETECTED Final   Coronavirus OC43 NOT DETECTED NOT DETECTED Final   Metapneumovirus NOT DETECTED NOT DETECTED Final   Rhinovirus / Enterovirus NOT DETECTED NOT DETECTED Final   Influenza A NOT DETECTED NOT DETECTED Final   Influenza B NOT DETECTED NOT DETECTED Final   Parainfluenza Virus 1 NOT DETECTED NOT DETECTED Final   Parainfluenza Virus 2 NOT DETECTED NOT DETECTED Final   Parainfluenza Virus 3 NOT DETECTED NOT DETECTED Final   Parainfluenza Virus 4 NOT DETECTED NOT DETECTED Final   Respiratory Syncytial Virus NOT DETECTED NOT DETECTED Final   Bordetella pertussis NOT DETECTED NOT DETECTED Final   Bordetella Parapertussis NOT DETECTED NOT DETECTED Final   Chlamydophila pneumoniae NOT DETECTED NOT DETECTED Final   Mycoplasma pneumoniae NOT DETECTED NOT DETECTED Final    Comment: Performed at [facility], [address], [place], [place] [address]     Labs: BNP (last 3 results) No results for input(s): "BNP" in the last 8760 hours. Basic Metabolic Panel: Recent Labs  Lab 04/22/22 0054 04/22/22 0845 04/23/22 0438 04/24/22 0539  NA 141 139 142 140  K 3.5 4.6 4.2 4.2  CL 106 104 106 104  CO2 28 28 29 30   GLUCOSE 127* 193* 163* 102*  BUN 12 11 15 13   CREATININE 0.79 0.81 0.79 0.80  CALCIUM 8.3* 8.8* 8.8* 8.7*   Liver Function Tests: Recent Labs  Lab 04/22/22 0054  AST 21  ALT 30  ALKPHOS 60  BILITOT 0.5  PROT 7.2  ALBUMIN 3.6   No results for input(s): "LIPASE", "AMYLASE" in the last 168 hours. No results for input(s):  "AMMONIA" in the last 168 hours. CBC: Recent Labs  Lab 04/22/22 0054 04/22/22 0845 04/23/22 0438 04/24/22 0539  WBC 8.2 10.1 15.1* 11.4*  NEUTROABS 4.1  --   --   --   HGB 14.3 15.4 14.7 15.8  HCT 47.8 51.1 49.2 51.4  MCV 90.9 90.4 90.4 90.2  PLT 227 242 253 251   Cardiac Enzymes: No results for input(s): "CKTOTAL", "CKMB", "CKMBINDEX", "TROPONINI" in the last 168 hours. BNP: Invalid input(s): "POCBNP" CBG: No results for input(s): "GLUCAP" in the last 168 hours. D-Dimer No results for input(s): "DDIMER" in the last 72 hours. Hgb A1c Recent Labs    04/23/22 0438  HGBA1C 5.9*   Lipid Profile No results for input(s): "CHOL", "HDL", "LDLCALC", "TRIG", "CHOLHDL", "LDLDIRECT" in the last  72 hours. Thyroid function studies No results for input(s): "TSH", "T4TOTAL", "T3FREE", "THYROIDAB" in the last 72 hours.  Invalid input(s): "FREET3" Anemia work up No results for input(s): "VITAMINB12", "FOLATE", "FERRITIN", "TIBC", "IRON", "RETICCTPCT" in the last 72 hours. Urinalysis    Component Value Date/Time   COLORURINE YELLOW 03/06/2020 1633   APPEARANCEUR TURBID (A) 03/06/2020 1633   LABSPEC 1.025 03/06/2020 1633   PHURINE 5.0 03/06/2020 1633   GLUCOSEU NEGATIVE 03/06/2020 1633   HGBUR NEGATIVE 03/06/2020 1633   BILIRUBINUR NEGATIVE 03/06/2020 1633   KETONESUR NEGATIVE 03/06/2020 1633   PROTEINUR 30 (A) 03/06/2020 1633   NITRITE NEGATIVE 03/06/2020 1633   LEUKOCYTESUR NEGATIVE 03/06/2020 1633   Sepsis Labs Recent Labs  Lab 04/22/22 0054 04/22/22 0845 04/23/22 0438 04/24/22 0539  WBC 8.2 10.1 15.1* 11.4*   Microbiology Recent Results (from the past 240 hour(s))  SARS Coronavirus 2 by RT PCR (hospital order, performed in [facility] hospital lab) *cepheid single result test* Anterior Nasal Swab     Status: None   Collection Time: 04/22/22  2:55 AM   Specimen: Anterior Nasal Swab  Result Value Ref Range Status   SARS Coronavirus 2 by RT PCR NEGATIVE NEGATIVE Final     Comment: (NOTE) SARS-CoV-2 target nucleic acids are NOT DETECTED.  The SARS-CoV-2 RNA is generally detectable in upper and lower respiratory specimens during the acute phase of infection. The lowest concentration of SARS-CoV-2 viral copies this assay can detect is 250 copies / mL. A negative result does not preclude SARS-CoV-2 infection and should not be used as the sole basis for treatment or other patient management decisions.  A negative result may occur with improper specimen collection / handling, submission of specimen other than nasopharyngeal swab, presence of viral mutation(s) within the areas targeted by this assay, and inadequate number of viral copies (<250 copies / mL). A negative result must be combined with clinical observations, patient history, and epidemiological information.  Fact Sheet for Patients:   RoadLapTop.co.za  Fact Sheet for Healthcare Providers: http://kim-miller.com/  This test is not yet approved or  cleared by the [place] FDA and has been authorized for detection and/or diagnosis of SARS-CoV-2 by FDA under an Emergency Use Authorization (EUA).  This EUA will remain in effect (meaning this test can be used) for the duration of the COVID-19 declaration under Section 564(b)(1) of the Act, 21 U.S.C. section 360bbb-3(b)(1), unless the authorization is terminated or revoked sooner.  Performed at [facility], [address] [place], [place] [address]   Respiratory (~20 pathogens) panel by PCR     Status: None   Collection Time: 04/22/22  1:21 PM   Specimen: Nasopharyngeal Swab; Respiratory  Result Value Ref Range Status   Adenovirus NOT DETECTED NOT DETECTED Final   Coronavirus 229E NOT DETECTED NOT DETECTED Final    Comment: (NOTE) The Coronavirus on the Respiratory Panel, DOES NOT test for the novel  Coronavirus (2019 nCoV)    Coronavirus HKU1 NOT DETECTED NOT DETECTED Final    Coronavirus NL63 NOT DETECTED NOT DETECTED Final   Coronavirus OC43 NOT DETECTED NOT DETECTED Final   Metapneumovirus NOT DETECTED NOT DETECTED Final   Rhinovirus / Enterovirus NOT DETECTED NOT DETECTED Final   Influenza A NOT DETECTED NOT DETECTED Final   Influenza B NOT DETECTED NOT DETECTED Final   Parainfluenza Virus 1 NOT DETECTED NOT DETECTED Final   Parainfluenza Virus 2 NOT DETECTED NOT DETECTED Final   Parainfluenza Virus 3 NOT DETECTED NOT DETECTED Final  Parainfluenza Virus 4 NOT DETECTED NOT DETECTED Final   Respiratory Syncytial Virus NOT DETECTED NOT DETECTED Final   Bordetella pertussis NOT DETECTED NOT DETECTED Final   Bordetella Parapertussis NOT DETECTED NOT DETECTED Final   Chlamydophila pneumoniae NOT DETECTED NOT DETECTED Final   Mycoplasma pneumoniae NOT DETECTED NOT DETECTED Final    Comment: Performed at [facility], [address], [place], [place] [address]     Time coordinating discharge: Over 30 minutes  SIGNED:   Charise Killian, MD  [facility] 04/24/2022, 12:09 PM Pager   If 7PM-7AM, please contact night-coverage [email or web address]

## 2022-04-24 NOTE — TOC CM/SW Note (Signed)
Mr. Langland presents with morbid obesity that is causing restrictive thoracic disorder.  The use of the NIV will treat this patient's ventilatory defects found in his recent PFT (FVC 38.7% of predicted) and can reduce risk of exacerbations and future hospitalizations when used at night and during the day. All alternate devices [phone] and [id number]) have been considered and ruled out as volume requirements are not met by BiLevel devices.  An NIV with volume-targeted pressure support is necessary to prevent patient from life-threatening harm.  Interruption or failure to provide NIV would quickly lead to exacerbation of the patient's condition, hospital re-admission, and likely harm to the patient. Continued use is preferred.  Patient is able to protect their airways and clear secretions on their own.

## 2022-04-24 NOTE — Progress Notes (Signed)
PHARMACIST - PHYSICIAN COMMUNICATION  CONCERNING: Antibiotic IV to Oral Route Change Policy  RECOMMENDATION: This patient is receiving azithromycin by the intravenous route.  Based on criteria approved by the Pharmacy and Therapeutics Committee, the antibiotic(s) is/are being converted to the equivalent oral dose form(s).   DESCRIPTION: These criteria include: Patient being treated for a respiratory tract infection, urinary tract infection, cellulitis or clostridium difficile associated diarrhea if on metronidazole The patient is not neutropenic and does not exhibit a GI malabsorption state The patient is eating (either orally or via tube) and/or has been taking other orally administered medications for a least 24 hours The patient is improving clinically and has a Tmax < 100.5  If you have questions about this conversion, please contact the Pharmacy Department   Tressie Ellis 04/24/22

## 2022-04-25 LAB — LEGIONELLA PNEUMOPHILA SEROGP 1 UR AG: L. pneumophila Serogp 1 Ur Ag: NEGATIVE

## 2022-07-03 ENCOUNTER — Observation Stay
Admission: EM | Admit: 2022-07-03 | Discharge: 2022-07-04 | Disposition: A | Discharge status: Home / Self Care | Payer: Managed Care, Other (non HMO) | Attending: Internal Medicine | Admitting: Internal Medicine

## 2022-07-03 ENCOUNTER — Emergency Department: Payer: Managed Care, Other (non HMO)

## 2022-07-03 ENCOUNTER — Other Ambulatory Visit: Payer: Self-pay

## 2022-07-03 ENCOUNTER — Encounter: Payer: Self-pay | Admitting: Emergency Medicine

## 2022-07-03 DIAGNOSIS — R197 Diarrhea, unspecified: Secondary | ICD-10-CM

## 2022-07-03 DIAGNOSIS — E66813 Obesity, class 3: Secondary | ICD-10-CM | POA: Insufficient documentation

## 2022-07-03 DIAGNOSIS — F17210 Nicotine dependence, cigarettes, uncomplicated: Secondary | ICD-10-CM | POA: Insufficient documentation

## 2022-07-03 DIAGNOSIS — R2243 Localized swelling, mass and lump, lower limb, bilateral: Secondary | ICD-10-CM | POA: Insufficient documentation

## 2022-07-03 DIAGNOSIS — Z7951 Long term (current) use of inhaled steroids: Secondary | ICD-10-CM | POA: Diagnosis not present

## 2022-07-03 DIAGNOSIS — J9601 Acute respiratory failure with hypoxia: Secondary | ICD-10-CM | POA: Diagnosis not present

## 2022-07-03 DIAGNOSIS — U071 COVID-19: Principal | ICD-10-CM | POA: Insufficient documentation

## 2022-07-03 DIAGNOSIS — Z79899 Other long term (current) drug therapy: Secondary | ICD-10-CM | POA: Insufficient documentation

## 2022-07-03 DIAGNOSIS — Z720 Tobacco use: Secondary | ICD-10-CM | POA: Diagnosis present

## 2022-07-03 DIAGNOSIS — J9602 Acute respiratory failure with hypercapnia: Secondary | ICD-10-CM | POA: Diagnosis present

## 2022-07-03 DIAGNOSIS — I10 Essential (primary) hypertension: Secondary | ICD-10-CM | POA: Insufficient documentation

## 2022-07-03 DIAGNOSIS — R059 Cough, unspecified: Secondary | ICD-10-CM | POA: Diagnosis present

## 2022-07-03 HISTORY — DX: Diarrhea, unspecified: R19.7

## 2022-07-03 LAB — SARS CORONAVIRUS 2 BY RT PCR: SARS Coronavirus 2 by RT PCR: POSITIVE — AB

## 2022-07-03 LAB — BASIC METABOLIC PANEL
Anion gap: 7 (ref 5–15)
BUN: 12 mg/dL (ref 6–20)
CO2: 30 mmol/L (ref 22–32)
Calcium: 8.8 mg/dL — ABNORMAL LOW (ref 8.9–10.3)
Chloride: 103 mmol/L (ref 98–111)
Creatinine, Ser: 1.1 mg/dL (ref 0.61–1.24)
GFR, Estimated: >60 mL/min (ref >60)
Glucose, Bld: 108 mg/dL — ABNORMAL HIGH (ref 70–99)
Potassium: 4 mmol/L (ref 3.5–5.1)
Sodium: 140 mmol/L (ref 135–145)

## 2022-07-03 LAB — CBC
HCT: 50.3 % (ref 39.0–52.0)
Hemoglobin: 14.9 g/dL (ref 13.0–17.0)
MCH: 27 pg (ref 26.0–34.0)
MCHC: 29.6 g/dL — ABNORMAL LOW (ref 30.0–36.0)
MCV: 91.3 fL (ref 80.0–100.0)
Platelets: 211 10*3/uL (ref 150–400)
RBC: 5.51 MIL/uL (ref 4.22–5.81)
RDW: 13.4 % (ref 11.5–15.5)
WBC: 7.4 10*3/uL (ref 4.0–10.5)
nRBC: 0 % (ref 0.0–0.2)

## 2022-07-03 LAB — PROCALCITONIN: Procalcitonin: <0.1 ng/mL

## 2022-07-03 LAB — TROPONIN I (HIGH SENSITIVITY)
Troponin I (High Sensitivity): 10 ng/L (ref <18)
Troponin I (High Sensitivity): 13 ng/L (ref <18)

## 2022-07-03 LAB — RESP PANEL BY RT-PCR (FLU A&B, COVID) ARPGX2
Influenza A by PCR: NEGATIVE
Influenza B by PCR: NEGATIVE
SARS Coronavirus 2 by RT PCR: POSITIVE — AB

## 2022-07-03 MED ORDER — ONDANSETRON HCL 4 MG/2ML IJ SOLN: 4.0000 mg | Freq: Four times a day (QID) | INTRAMUSCULAR | Status: DC | PRN

## 2022-07-03 MED ORDER — ENOXAPARIN SODIUM 40 MG/0.4ML IJ SOSY
40.0000 mg | PREFILLED_SYRINGE | INTRAMUSCULAR | Status: DC
  Administered 2022-07-03: 40 mg via SUBCUTANEOUS
  Filled 2022-07-03: qty 0.4

## 2022-07-03 MED ORDER — DEXAMETHASONE SODIUM PHOSPHATE 10 MG/ML IJ SOLN
10.0000 mg | Freq: Once | INTRAMUSCULAR | Status: AC
  Administered 2022-07-03: 10 mg via INTRAVENOUS
  Filled 2022-07-03: qty 1

## 2022-07-03 MED ORDER — ACETAMINOPHEN 325 MG PO TABS
650.0000 mg | ORAL_TABLET | Freq: Four times a day (QID) | ORAL | Status: DC | PRN
  Administered 2022-07-03: 650 mg via ORAL
  Filled 2022-07-03: qty 2

## 2022-07-03 MED ORDER — ALBUTEROL SULFATE (2.5 MG/3ML) 0.083% IN NEBU
2.5000 mg | INHALATION_SOLUTION | Freq: Once | RESPIRATORY_TRACT | Status: AC
  Administered 2022-07-03: 2.5 mg via RESPIRATORY_TRACT
  Filled 2022-07-03: qty 3

## 2022-07-03 MED ORDER — LISINOPRIL 5 MG PO TABS
5.0000 mg | ORAL_TABLET | Freq: Every day | ORAL | Status: DC
  Administered 2022-07-03 – 2022-07-04 (×2): 5 mg via ORAL
  Filled 2022-07-03 (×2): qty 1

## 2022-07-03 MED ORDER — ACETAMINOPHEN 500 MG PO TABS
1000.0000 mg | ORAL_TABLET | Freq: Once | ORAL | Status: AC
  Administered 2022-07-03: 1000 mg via ORAL
  Filled 2022-07-03: qty 2

## 2022-07-03 MED ORDER — ENOXAPARIN SODIUM 80 MG/0.8ML IJ SOSY
80.0000 mg | PREFILLED_SYRINGE | INTRAMUSCULAR | Status: DC
  Filled 2022-07-03: qty 0.8

## 2022-07-03 MED ORDER — SENNOSIDES-DOCUSATE SODIUM 8.6-50 MG PO TABS: 1.0000 | ORAL_TABLET | Freq: Every evening | ORAL | Status: DC | PRN

## 2022-07-03 MED ORDER — DIPHENOXYLATE-ATROPINE 2.5-0.025 MG PO TABS: 1.0000 | ORAL_TABLET | Freq: Two times a day (BID) | ORAL | Status: DC | PRN

## 2022-07-03 MED ORDER — HYDROCOD POLI-CHLORPHE POLI ER 10-8 MG/5ML PO SUER: 5.0000 mL | Freq: Every evening | ORAL | Status: DC | PRN

## 2022-07-03 MED ORDER — NIRMATRELVIR/RITONAVIR (PAXLOVID)TABLET
3.0000 | ORAL_TABLET | Freq: Two times a day (BID) | ORAL | Status: DC
  Administered 2022-07-03 – 2022-07-04 (×3): 3 via ORAL
  Filled 2022-07-03: qty 30

## 2022-07-03 MED ORDER — IPRATROPIUM-ALBUTEROL 0.5-2.5 (3) MG/3ML IN SOLN: 3.0000 mL | Freq: Four times a day (QID) | RESPIRATORY_TRACT | Status: DC | PRN

## 2022-07-03 MED ORDER — BENZONATATE 100 MG PO CAPS: 200.0000 mg | ORAL_CAPSULE | Freq: Two times a day (BID) | ORAL | Status: DC | PRN

## 2022-07-03 MED ORDER — AMLODIPINE BESYLATE 10 MG PO TABS
10.0000 mg | ORAL_TABLET | Freq: Every day | ORAL | Status: DC
  Administered 2022-07-03 – 2022-07-04 (×2): 10 mg via ORAL
  Filled 2022-07-03: qty 1
  Filled 2022-07-03: qty 2

## 2022-07-03 MED ORDER — PREDNISONE 50 MG PO TABS: 50.0000 mg | ORAL_TABLET | Freq: Every day | ORAL | Status: DC

## 2022-07-03 MED ORDER — HYDRALAZINE HCL 10 MG PO TABS: 10.0000 mg | ORAL_TABLET | Freq: Four times a day (QID) | ORAL | Status: DC | PRN

## 2022-07-03 MED ORDER — NIRMATRELVIR/RITONAVIR (PAXLOVID)TABLET: 3.0000 | ORAL_TABLET | Freq: Two times a day (BID) | ORAL | Status: DC

## 2022-07-03 MED ORDER — METHYLPREDNISOLONE SODIUM SUCC 125 MG IJ SOLR
0.5000 mg/kg | Freq: Two times a day (BID) | INTRAMUSCULAR | Status: DC
  Administered 2022-07-03 – 2022-07-04 (×2): 79.375 mg via INTRAVENOUS
  Filled 2022-07-03 (×2): qty 2

## 2022-07-03 MED ORDER — NICOTINE 21 MG/24HR TD PT24: 21.0000 mg | MEDICATED_PATCH | Freq: Every day | TRANSDERMAL | Status: DC | PRN

## 2022-07-03 MED ORDER — ONDANSETRON HCL 4 MG PO TABS: 4.0000 mg | ORAL_TABLET | Freq: Four times a day (QID) | ORAL | Status: DC | PRN

## 2022-07-03 NOTE — Progress Notes (Signed)
PHARMACIST - PHYSICIAN COMMUNICATION  CONCERNING:  Enoxaparin (Lovenox) for DVT Prophylaxis    RECOMMENDATION: Patient was prescribed enoxaprin 40mg  q24 hours for VTE prophylaxis.   Filed Weights   07/03/22 1146  Weight: (!) 158.8 kg (350 lb 1.5 oz)    Body mass index is 47.48 kg/m.  Estimated Creatinine Clearance: 147.4 mL/min (by C-G formula based on SCr of 1.1 mg/dL).   Based on [place] patient is candidate for enoxaparin 0.5mg /kg TBW SQ every 24 hours based on BMI being >30.   DESCRIPTION: Pharmacy has adjusted enoxaparin dose per [facility] policy.  Patient is now receiving enoxaparin 80 mg every 24 hours    Dorothe Pea, PharmD, BCPS Clinical Pharmacist   07/03/2022 10:10 PM

## 2022-07-03 NOTE — Assessment & Plan Note (Addendum)
Finish a course of Paxlovid.  Prednisone taper prescribed.  Albuterol inhaler as needed.  As needed cough medication.

## 2022-07-03 NOTE — Assessment & Plan Note (Deleted)
-   Secondary to [id number] infection complicated by continued nicotine use and dependence - Paxlovid per pharmacy, normal dosing - Status post Decadron 10 mg IV one-time dose per EDP - We will continue with Solu-Medrol 0.5 mg/kg dosing with taper to prednisone oral - Admit to telemetry medical, inpatient

## 2022-07-03 NOTE — Assessment & Plan Note (Addendum)
BMI of 47.48.  I recommended getting a sleep study as outpatient once feeling better to rule out sleep apnea.

## 2022-07-03 NOTE — ED Notes (Signed)
Pt wanted medication for headache. Pt not allergic to acetaminophen.

## 2022-07-03 NOTE — ED Triage Notes (Signed)
First Nurse Note:  Awoke this morning with cough, chest congestion, diarrhea.  Took some allergy and sinus pills.  C/O low back pain.

## 2022-07-03 NOTE — Assessment & Plan Note (Addendum)
Likely secondary to COVID-19 infection. 

## 2022-07-03 NOTE — ED Provider Notes (Signed)
[facility] Provider Note    Event Date/Time   First MD Initiated Contact with Patient 07/03/22 1323     (approximate)   History   URI   HPI  Ryan Maxwell is a 36 y.o. male history of bronchitis with smoking presents to the ER for evaluation 24 hours of congestion and nausea cough stress malaise fevers chills backaches.  Does have there is also her sick with similar symptoms.  He does not wear home oxygen but was found to be hypoxic on room air on arrival.     Physical Exam   Triage Vital Signs: ED Triage Vitals  Enc Vitals Group     BP 07/03/22 1148 (!) 144/97     Pulse Rate 07/03/22 1148 (!) 108     Resp 07/03/22 1148 (!) 22     Temp 07/03/22 1148 100.3 F (37.9 C)     Temp Source 07/03/22 1148 Oral     SpO2 07/03/22 1148 (!) 89 %     Weight 07/03/22 1146 (!) 350 lb 1.5 oz (158.8 kg)     Height 07/03/22 1146 6' (1.829 m)     Head Circumference --      Peak Flow --      Pain Score 07/03/22 1146 8     Pain Loc --      Pain Edu? --      Excl. in GC? --     Most recent vital signs: Vitals:   07/03/22 1148  BP: (!) 144/97  Pulse: (!) 108  Resp: (!) 22  Temp: 100.3 F (37.9 C)  SpO2: (!) 89%     Constitutional: Alert  Eyes: Conjunctivae are normal.  Head: Atraumatic. Nose: + congestion/rhinnorhea. Mouth/Throat: Mucous membranes are moist.   Neck: Painless ROM.  Cardiovascular:   Good peripheral circulation. Respiratory: Mild tachypnea with coarse breath sounds throughout. Gastrointestinal: Soft and nontender.  Musculoskeletal:  no deformity Neurologic:  MAE spontaneously. No gross focal neurologic deficits are appreciated.  Skin:  Skin is warm, dry and intact. No rash noted. Psychiatric: Mood and affect are normal. Speech and behavior are normal.    ED Results / Procedures / Treatments   Labs (all labs ordered are listed, but only abnormal results are displayed) Labs Reviewed  SARS CORONAVIRUS 2 BY RT PCR - Abnormal;  Notable for the following components:      Result Value   SARS Coronavirus 2 by RT PCR POSITIVE (*)    All other components within normal limits  RESP PANEL BY RT-PCR (FLU A&B, COVID) ARPGX2 - Abnormal; Notable for the following components:   SARS Coronavirus 2 by RT PCR POSITIVE (*)    All other components within normal limits  CBC - Abnormal; Notable for the following components:   MCHC 29.6 (*)    All other components within normal limits  BASIC METABOLIC PANEL - Abnormal; Notable for the following components:   Glucose, Bld 108 (*)    Calcium 8.8 (*)    All other components within normal limits  PROCALCITONIN  TROPONIN I (HIGH SENSITIVITY)  TROPONIN I (HIGH SENSITIVITY)     EKG  ED ECG REPORT I, Willy Eddy, the attending physician, personally viewed and interpreted this ECG.   Date: 07/03/2022  EKG Time: 11:58  Rate: 100  Rhythm: sinus  Axis: normal  Intervals: normal  ST&T Change: no stemi, no depressions    RADIOLOGY Please see ED Course for my review and interpretation.  I personally reviewed all  radiographic images ordered to evaluate for the above acute complaints and reviewed radiology reports and findings.  These findings were personally discussed with the patient.  Please see medical record for radiology report.    PROCEDURES:  Critical Care performed: Yes, see critical care procedure note(s)  .Critical Care  Performed by: Merlyn Lot, MD Authorized by: Merlyn Lot, MD   Critical care provider statement:    Critical care time (minutes):  35   Critical care was necessary to treat or prevent imminent or life-threatening deterioration of the following conditions:  Respiratory failure   Critical care was time spent personally by me on the following activities:  Ordering and performing treatments and interventions, ordering and review of laboratory studies, ordering and review of radiographic studies, pulse oximetry, re-evaluation of  patient's condition, review of old charts, obtaining history from patient or surrogate, examination of patient, evaluation of patient's response to treatment, discussions with primary provider, discussions with consultants and development of treatment plan with patient or surrogate    MEDICATIONS ORDERED IN ED: Medications  nirmatrelvir/ritonavir EUA (PAXLOVID) 3 tablet (3 tablets Oral Given 07/03/22 1432)  acetaminophen (TYLENOL) tablet 650 mg (has no administration in time range)  ondansetron (ZOFRAN) tablet 4 mg (has no administration in time range)    Or  ondansetron (ZOFRAN) injection 4 mg (has no administration in time range)  enoxaparin (LOVENOX) injection 40 mg (has no administration in time range)  methylPREDNISolone sodium succinate (SOLU-MEDROL) 125 mg/2 mL injection 79.375 mg (has no administration in time range)    Followed by  predniSONE (DELTASONE) tablet 50 mg (has no administration in time range)  senna-docusate (Senokot-S) tablet 1 tablet (has no administration in time range)  ipratropium-albuterol (DUONEB) 0.5-2.5 (3) MG/3ML nebulizer solution 3 mL (has no administration in time range)  nicotine (NICODERM CQ - dosed in mg/24 hours) patch 21 mg (has no administration in time range)  benzonatate (TESSALON) capsule 200 mg (has no administration in time range)  chlorpheniramine-HYDROcodone (TUSSIONEX) 10-8 MG/5ML suspension 5 mL (has no administration in time range)  diphenoxylate-atropine (LOMOTIL) 2.5-0.025 MG per tablet 1 tablet (has no administration in time range)  amLODipine (NORVASC) tablet 10 mg (10 mg Oral Given 07/03/22 1431)  lisinopril (ZESTRIL) tablet 5 mg (5 mg Oral Given 07/03/22 1432)  hydrALAZINE (APRESOLINE) tablet 10 mg (has no administration in time range)  acetaminophen (TYLENOL) tablet 1,000 mg (1,000 mg Oral Given 07/03/22 1158)  dexamethasone (DECADRON) injection 10 mg (10 mg Intravenous Given 07/03/22 1332)  albuterol (PROVENTIL) (2.5 MG/3ML) 0.083%  nebulizer solution 2.5 mg (2.5 mg Nebulization Given 07/03/22 1332)     IMPRESSION / MDM / [place] / ED COURSE  I reviewed the triage vital signs and the nursing notes.                              Differential diagnosis includes, but is not limited to, Asthma, copd, CHF, pna, ptx, malignancy, Pe, anemia  Patient presented to the ER for evaluation of symptoms as described above.  This presenting complaint could reflect a potentially life-threatening illness therefore the patient will be placed on continuous pulse oximetry and telemetry for monitoring.  Laboratory evaluation will be sent to evaluate for the above complaints.  Patient febrile tachycardic tachypneic hypoxic.  Concern for sepsis versus pneumonia.  Upon my evaluation is arrival to ER room his COVID test is already come back positive which I think would explain his sepsis.  Given lack  of leukocytosis we will hold off on IV antibiotics at this time.  He is requiring supplemental oxygen we will give nebulizer as well as Decadron.  Given his acute respiratory failure with hypoxia will consult hospitalist for admission.       FINAL CLINICAL IMPRESSION(S) / ED DIAGNOSES   Final diagnoses:  Acute respiratory failure with hypoxia (HCC)  COVID-19     Rx / DC Orders   ED Discharge Orders     None        Note:  This document was prepared using Dragon voice recognition software and may include unintentional dictation errors.    Willy Eddy, MD 07/03/22 1435

## 2022-07-03 NOTE — Hospital Course (Signed)
Mr. Ryan Maxwell is a 36 year old male with tobacco use and dependence, hypertension, morbid obesity, who presents emergency department for chief concerns of cough, congestion, diarrhea.  Initial vitals in the emergency department showed temperature of 100.3, respiration rate of 22, heart rate of 108, blood pressure 144/97, SPO2 of 89% on room air.  Serum sodium is 140, potassium 4.0, chloride of 103, bicarb 30, BUN of 12, serum creatinine 1.10, GFR greater than 60, nonfasting blood glucose 108, WBC 7.4, hemoglobin 14.9, platelets of 211.  ED treatment: Paxlovid, Decadron 10 mg IV one-time dose, albuterol 2.5 mg nebulizer.

## 2022-07-03 NOTE — Assessment & Plan Note (Addendum)
-   Amlodipine 10 mg daily, lisinopril increased to 20 mg daily.  We will add low-dose hydrochlorothiazide 12.5 mg daily also.  Blood pressure likely chronically high since the patient was out of his medications at home.

## 2022-07-03 NOTE — H&P (Addendum)
History and Physical   Keenon Leitzel Labus TIR:[id number] DOB: 1986/05/23 DOA: 07/03/2022  PCP: Pcp, No  Patient coming from: Home  I have personally briefly reviewed patient's old medical records in [place].  Chief Concern: Cough, congestion, shortness of breath  HPI: Mr. Ryan Maxwell is a 36 year old male with tobacco use and dependence, hypertension, morbid obesity, who presents emergency department for chief concerns of cough, congestion, diarrhea.  Initial vitals in the emergency department showed temperature of 100.3, respiration rate of 22, heart rate of 108, blood pressure 144/97, SPO2 of 89% on room air.  Serum sodium is 140, potassium 4.0, chloride of 103, bicarb 30, BUN of 12, serum creatinine 1.10, GFR greater than 60, nonfasting blood glucose 108, WBC 7.4, hemoglobin 14.9, platelets of 211.  ED treatment: Paxlovid, Decadron 10 mg IV one-time dose, albuterol 2.5 mg nebulizer.  At bedside, he is able to tell me his name, age, current location, current calendar year.   He reports 1 episode of loose stool evening of 07/02/2022 due to screen.  And when he wiped there was some red streaks.  He denies black stools and blood in his stool.  He denies nausea, vomiting.  He endorses chills and subjective fever.  He endorses cough and congestion that started on day of admission.  He reports the sputum is yellow.  He reports sick contacts, as his newborn of 45 months old was recently sick.  Social history: He lives with his mother and wife at home and two kids. He smokes 1 ppd and started at age 8. He denies etoh and recreational IV drug use.  He occasionally uses marijuana.  ROS: Constitutional: no weight change, no fever ENT/Mouth: no sore throat, no rhinorrhea Eyes: no eye pain, no vision changes Cardiovascular: no chest pain, + dyspnea,  no edema, no palpitations Respiratory: + cough, + sputum, no wheezing Gastrointestinal: no nausea, no vomiting, no diarrhea, no  constipation Genitourinary: no urinary incontinence, no dysuria, no hematuria Musculoskeletal: no arthralgias, no myalgias Skin: no skin lesions, no pruritus, Neuro: + weakness, no loss of consciousness, no syncope Psych: no anxiety, no depression, no decrease appetite Heme/Lymph: no bruising, no bleeding  ED Course: Discussed with emergency medicine provider, patient requiring hospitalization for chief concerns of hypoxia.  Assessment/Plan  Principal Problem:   Acute hypoxemic respiratory failure (HCC) Active Problems:   COVID-19 virus infection   Essential hypertension   Cigarette nicotine dependence, uncomplicated   Obesity, Class III, BMI 40-49.9 (morbid obesity) ([place])   Diarrhea   Assessment and Plan:  * Acute hypoxemic respiratory failure ([place]) - Secondary to QPYPP-50 infection complicated by continued nicotine use and dependence - Paxlovid per pharmacy, normal dosing - Status post Decadron 10 mg IV one-time dose per EDP - We will continue with Solu-Medrol 0.5 mg/kg dosing with taper to prednisone oral - Admit to telemetry medical, inpatient  COVID-19 virus infection - Supportive measures: Tessalon Perles 200 mg, p.o., twice daily as needed between meals for cough, 3 days ordered Tussionex 5 mg p.o. nightly as needed for cough, 4 days ordered - DuoNebs, 3 mL nebulizer every 6 hours as needed for wheezing and shortness of breath, 3 doses ordered  Essential hypertension - Amlodipine 10 mg daily, lisinopril 5 mg daily resumed - Hydralazine 10 mg p.o. every 6 hours as needed for SBP greater than 180, 3 days ordered  Diarrhea - Patient endorses 1 episode (evening 07/02/2022) of loose stool that is green with streak of red on wiping - Secondary to  gastroenteritis in setting of COVID-19 infection  Obesity, Class III, BMI 40-49.9 (morbid obesity) (HCC) - Patient has BMI of 47.48. This complicates overall care and prognosis. - Encourage healthy diet including healthy protein  such as chicken breast, vegetables, staying away from fried foods, fast food, and eating out - Counseled patient on safe and gradual weight loss of approximately 2 pounds per month - Patient endorsed understanding and compliance  Cigarette nicotine dependence, uncomplicated - As needed nicotine patch ordered for nicotine craving - Patient endorses readiness to stop tobacco use. - Greater than 3 minutes of tobacco cessation counseling: Week one, smoke 17 cigarettes per day. Week two, smoke 16 cigarettes per day. Week three, smoke 15 cigarettes per day, continue until smoking half the amount of cigarettes per day Discuss with PCP for pharmacologic assistance with smoking cessation During in-between cigarettes, if you feel the urge to smoke, use the following: stress squeezing devices/phone a trusted friend to talk you through the urge/walk in a safe environment Smoking is affecting your lungs and this is made worse as you currently have COVID 19 infection Smoking and healthy weight loss are within the wound of control as it is not a genetic problem  Chart reviewed.   DVT prophylaxis: Enoxaparin Code Status: Full code Diet: Heart healthy Family Communication: Updated mother at bedside with patient's permission Disposition Plan: Pending clinical course Consults called: None at this time Admission status: Telemetry medical, inpatient  Past Medical History:  Diagnosis Date   Acute respiratory failure with hypoxia (HCC) 03/06/2020   Bronchitis    Hypertension    Pneumonia    Tobacco abuse    History reviewed. No pertinent surgical history.  Social History:  reports that he has been smoking cigarettes. He has been smoking an average of 1 pack per day. He has never used smokeless tobacco. He reports that he does not currently use alcohol. He reports current drug use. Drug: Marijuana.  Allergies  Allergen Reactions   Dayquil [Pseudoephedrine-Apap-Dm] Shortness Of Breath   Nyquil  Multi-Symptom [Pseudoeph-Doxylamine-Dm-Apap] Shortness Of Breath   Family History  Problem Relation Age of Onset   Hypertension Mother    Hypothyroidism Mother    Hypertension Other    Family history: Family history reviewed and not pertinent.  Prior to Admission medications   Medication Sig Start Date End Date Taking? Authorizing Provider  amLODipine (NORVASC) 10 MG tablet Take 1 tablet (10 mg total) by mouth daily. 04/25/22 05/25/22  Charise Killian, MD  lisinopril (ZESTRIL) 5 MG tablet Take 1 tablet (5 mg total) by mouth daily. 03/11/20 11/21/21  Kendell Bane, MD   Physical Exam: Vitals:   07/03/22 1146 07/03/22 1148  BP:  (!) 144/97  Pulse:  (!) 108  Resp:  (!) 22  Temp:  100.3 F (37.9 C)  TempSrc:  Oral  SpO2:  (!) 89%  Weight: (!) 158.8 kg   Height: 6' (1.829 m)    Constitutional: appears older than chronological age, NAD, calm Eyes: PERRL, lids and conjunctivae normal ENMT: Mucous membranes are moist. Posterior pharynx clear of any exudate or lesions. Age-appropriate dentition. Hearing appropriate Neck: normal, supple, no masses, no thyromegaly Respiratory: clear to auscultation bilaterally, no wheezing, no crackles. Normal respiratory effort. No accessory muscle use.  Cardiovascular: Elevated heart rate with regular rhythm, no murmurs / rubs / gallops. No extremity edema. 2+ pedal pulses. No carotid bruits.  Abdomen: Morbidly obese abdomen no tenderness, no masses palpated, no hepatosplenomegaly. Bowel sounds positive.  Musculoskeletal: no clubbing /  cyanosis. No joint deformity upper and lower extremities. Good ROM, no contractures, no atrophy. Normal muscle tone.  Skin: no rashes, lesions, ulcers. No induration Neurologic: Sensation intact. Strength 5/5 in all 4.  Psychiatric: Normal judgment and insight. Alert and oriented x 3. Normal mood.   EKG: independently reviewed, showing sinus tachycardia with rate of 103, QTc 450  Chest x-ray on Admission: I  personally reviewed and I agree with radiologist reading as below.  DG Chest 2 View  Result Date: 07/03/2022 CLINICAL DATA:  Chest pain. EXAM: CHEST - 2 VIEW COMPARISON:  April 22, 2022 FINDINGS: Cardiomediastinal silhouette is normal. Mediastinal contours appear intact. Possible minimal streaky opacities in bilateral lung bases. Osseous structures are without acute abnormality. Soft tissues are grossly normal. IMPRESSION: Possible minimal streaky opacities in bilateral lung bases may represent atelectasis or early peribronchial airspace consolidation. Electronically Signed   By: Ted Mcalpine M.D.   On: 07/03/2022 12:36    Labs on Admission: I have personally reviewed following labs  CBC: Recent Labs  Lab 07/03/22 1201  WBC 7.4  HGB 14.9  HCT 50.3  MCV 91.3  PLT 211   Basic Metabolic Panel: Recent Labs  Lab 07/03/22 1201  NA 140  K 4.0  CL 103  CO2 30  GLUCOSE 108*  BUN 12  CREATININE 1.10  CALCIUM 8.8*   GFR: Estimated Creatinine Clearance: 147.4 mL/min (by C-G formula based on SCr of 1.1 mg/dL).  Urine analysis:    Component Value Date/Time   COLORURINE YELLOW 03/06/2020 1633   APPEARANCEUR TURBID (A) 03/06/2020 1633   LABSPEC 1.025 03/06/2020 1633   PHURINE 5.0 03/06/2020 1633   GLUCOSEU NEGATIVE 03/06/2020 1633   HGBUR NEGATIVE 03/06/2020 1633   BILIRUBINUR NEGATIVE 03/06/2020 1633   KETONESUR NEGATIVE 03/06/2020 1633   PROTEINUR 30 (A) 03/06/2020 1633   NITRITE NEGATIVE 03/06/2020 1633   LEUKOCYTESUR NEGATIVE 03/06/2020 1633   Dr. Sedalia Muta [facility]  If 7PM-7AM, please contact overnight-coverage provider If 7AM-7PM, please contact day coverage provider [email or web address]  07/03/2022, 2:31 PM

## 2022-07-03 NOTE — ED Notes (Signed)
Pt placed on 2L with oxygen improving to 94%.

## 2022-07-03 NOTE — Assessment & Plan Note (Deleted)
-   As needed nicotine patch ordered for nicotine craving - Patient endorses readiness to stop tobacco use. - Greater than 3 minutes of tobacco cessation counseling: 1. Week one, smoke 17 cigarettes per day. Week two, smoke 16 cigarettes per day. Week three, smoke 15 cigarettes per day, continue until smoking half the amount of cigarettes per day 2. Discuss with PCP for pharmacologic assistance with smoking cessation 3. During in-between cigarettes, if you feel the urge to smoke, use the following: stress squeezing devices/phone a trusted friend to talk you through the urge/walk in a safe environment 4. Smoking is affecting your lungs and this is made worse as you currently have COVID 19 infection 5. Smoking and healthy weight loss are within the wound of control as it is not a genetic problem

## 2022-07-03 NOTE — ED Notes (Signed)
See triage note  Presents with low grade temp,runny nose and cough    States sx's started this am   HAs tried OTC meds w/o relief

## 2022-07-04 DIAGNOSIS — R197 Diarrhea, unspecified: Secondary | ICD-10-CM | POA: Diagnosis not present

## 2022-07-04 DIAGNOSIS — U071 COVID-19: Secondary | ICD-10-CM

## 2022-07-04 DIAGNOSIS — Z720 Tobacco use: Secondary | ICD-10-CM

## 2022-07-04 DIAGNOSIS — J9601 Acute respiratory failure with hypoxia: Secondary | ICD-10-CM | POA: Diagnosis not present

## 2022-07-04 DIAGNOSIS — I10 Essential (primary) hypertension: Secondary | ICD-10-CM | POA: Diagnosis not present

## 2022-07-04 LAB — CBC WITH DIFFERENTIAL/PLATELET
Abs Immature Granulocytes: 0.04 10*3/uL (ref 0.00–0.07)
Basophils Absolute: 0 10*3/uL (ref 0.0–0.1)
Basophils Relative: 0 %
Eosinophils Absolute: 0 10*3/uL (ref 0.0–0.5)
Eosinophils Relative: 0 %
HCT: 54.9 % — ABNORMAL HIGH (ref 39.0–52.0)
Hemoglobin: 16.6 g/dL (ref 13.0–17.0)
Immature Granulocytes: 1 %
Lymphocytes Relative: 13 %
Lymphs Abs: 0.8 10*3/uL (ref 0.7–4.0)
MCH: 26.9 pg (ref 26.0–34.0)
MCHC: 30.2 g/dL (ref 30.0–36.0)
MCV: 88.8 fL (ref 80.0–100.0)
Monocytes Absolute: 0.3 10*3/uL (ref 0.1–1.0)
Monocytes Relative: 5 %
Neutro Abs: 4.9 10*3/uL (ref 1.7–7.7)
Neutrophils Relative %: 81 %
Platelets: 239 10*3/uL (ref 150–400)
RBC: 6.18 MIL/uL — ABNORMAL HIGH (ref 4.22–5.81)
RDW: 13.2 % (ref 11.5–15.5)
WBC: 6 10*3/uL (ref 4.0–10.5)
nRBC: 0 % (ref 0.0–0.2)

## 2022-07-04 LAB — COMPREHENSIVE METABOLIC PANEL
ALT: 48 U/L — ABNORMAL HIGH (ref 0–44)
AST: 34 U/L (ref 15–41)
Albumin: 4.3 g/dL (ref 3.5–5.0)
Alkaline Phosphatase: 71 U/L (ref 38–126)
Anion gap: 9 (ref 5–15)
BUN: 11 mg/dL (ref 6–20)
CO2: 28 mmol/L (ref 22–32)
Calcium: 9.2 mg/dL (ref 8.9–10.3)
Chloride: 104 mmol/L (ref 98–111)
Creatinine, Ser: 0.83 mg/dL (ref 0.61–1.24)
GFR, Estimated: >60 mL/min (ref >60)
Glucose, Bld: 163 mg/dL — ABNORMAL HIGH (ref 70–99)
Potassium: 4.4 mmol/L (ref 3.5–5.1)
Sodium: 141 mmol/L (ref 135–145)
Total Bilirubin: 0.7 mg/dL (ref 0.3–1.2)
Total Protein: 8.6 g/dL — ABNORMAL HIGH (ref 6.5–8.1)

## 2022-07-04 MED ORDER — PREDNISONE 10 MG PO TABS: ORAL_TABLET | ORAL | 0 refills | Status: DC

## 2022-07-04 MED ORDER — LISINOPRIL 20 MG PO TABS: 20.0000 mg | ORAL_TABLET | Freq: Every day | ORAL | 1 refills | Status: DC

## 2022-07-04 MED ORDER — AMLODIPINE BESYLATE 10 MG PO TABS: 10.0000 mg | ORAL_TABLET | Freq: Every day | ORAL | 1 refills | Status: DC

## 2022-07-04 MED ORDER — ALBUTEROL SULFATE HFA 108 (90 BASE) MCG/ACT IN AERS: 2.0000 | INHALATION_SPRAY | Freq: Four times a day (QID) | RESPIRATORY_TRACT | 2 refills | Status: DC | PRN

## 2022-07-04 MED ORDER — HYDROCOD POLI-CHLORPHE POLI ER 10-8 MG/5ML PO SUER: 5.0000 mL | Freq: Every evening | ORAL | 0 refills | Status: DC | PRN

## 2022-07-04 MED ORDER — NICOTINE 21 MG/24HR TD PT24: MEDICATED_PATCH | TRANSDERMAL | 0 refills | Status: DC

## 2022-07-04 MED ORDER — HYDROCHLOROTHIAZIDE 12.5 MG PO TABS: 12.5000 mg | ORAL_TABLET | Freq: Every day | ORAL | 1 refills | Status: DC

## 2022-07-04 MED ORDER — NIRMATRELVIR/RITONAVIR (PAXLOVID)TABLET: 3.0000 | ORAL_TABLET | Freq: Two times a day (BID) | ORAL | 0 refills | Status: AC

## 2022-07-04 NOTE — Plan of Care (Signed)

## 2022-07-04 NOTE — Assessment & Plan Note (Addendum)
Patient had a pulse ox of 89% on room air.  Today with ambulation pulse ox was 94%.  Patient wanted to go home.  Steroids prescribed yesterday and will continue prednisone upon discharge.

## 2022-07-04 NOTE — TOC Initial Note (Signed)
Transition of Care [facility]) - Initial/Assessment Note    Patient Details  Name: Ryan Maxwell MRN: [id number] Date of Birth: [birth year]  [facility] of [facility]) CM/SW Contact:    Laurena Slimmer, RN Phone Number: 07/04/2022, 4:15 PM  Clinical Narrative:                  Transition of Care (TOC) Screening Note   Patient Details  Name: Ryan Maxwell Date of Birth: 1986-06-07   Transition of Care [facility]) CM/SW Contact:    Laurena Slimmer, RN Phone Number: 07/04/2022, 4:15 PM    [facility]) has reviewed patient and no TOC needs have been identified at this time. We will continue to monitor patient advancement through interdisciplinary progression rounds. If new patient transition needs arise, please place a TOC consult.          Patient Goals and CMS Choice        Expected Discharge Plan and Services           Expected Discharge Date: 07/04/22                                    Prior Living Arrangements/Services                       Activities of Daily Living Home Assistive Devices/Equipment: None ADL Screening (condition at time of admission) Patient's cognitive ability adequate to safely complete daily activities?: Yes Is the patient deaf or have difficulty hearing?: No Does the patient have difficulty seeing, even when wearing glasses/contacts?: No Does the patient have difficulty concentrating, remembering, or making decisions?: No Patient able to express need for assistance with ADLs?: Yes Does the patient have difficulty dressing or bathing?: No Independently performs ADLs?: Yes (appropriate for developmental age) Does the patient have difficulty walking or climbing stairs?: No Weakness of Legs: None Weakness of Arms/Hands: None  Permission Sought/Granted                  Emotional Assessment              Admission diagnosis:  Acute respiratory failure with hypoxia ([place]) [J96.01] Acute  hypoxemic respiratory failure ([place]) [J96.01] COVID-19 [U07.1] Patient Active Problem List   Diagnosis Date Noted   Acute hypoxemic respiratory failure ([place]) 07/03/2022   Obesity, Class III, BMI 40-49.9 (morbid obesity) ([place]) 07/03/2022   Diarrhea 07/03/2022   CAP (community acquired pneumonia) 04/22/2022   Acute bronchitis 11/21/2021   Tobacco abuse 11/21/2021   Chest pain 11/21/2021   Lower respiratory tract infection due to COVID-19 virus 07/25/2021   Hilar lymphadenopathy 07/25/2021   Cigarette nicotine dependence, uncomplicated 08/04/2535   Hypersomnolence 03/15/2020   COVID-19 virus infection 03/06/2020   Acute respiratory failure with hypoxia ([place]) 03/06/2020   Essential hypertension 03/06/2020   PCP:  Pcp, No Pharmacy:   [id number] - [place], [place] - [address] [place] [address] Phone: [phone] Fax: [facility] [address] [place], [place] - [place] [address] Phone: [phone] Fax: [phone]     Social Determinants of Health (SDOH) Interventions    Readmission Risk Interventions     No data to display

## 2022-07-04 NOTE — Discharge Summary (Signed)
Physician Discharge Summary   Patient: Ryan Maxwell MRN: [id number] DOB: 1986/09/09  Admit date:     07/03/2022  Discharge date: 07/04/22  Discharge Physician: Loletha Grayer   PCP: Pcp, No   Recommendations at discharge:   He will need a new PCP.  He was given the name of Dr. Elijio Miles  Discharge Diagnoses: Principal Problem:   Acute respiratory failure with hypoxia ([place]) Active Problems:   COVID-19 virus infection   Essential hypertension   Obesity, Class III, BMI 40-49.9 (morbid obesity) ([place])   Diarrhea   Tobacco abuse    Hospital Course: Mr. Ryan Maxwell is a 36 year old male with tobacco use and dependence, hypertension, morbid obesity, who presents emergency department for chief concerns of cough, congestion, diarrhea.  Initial vitals in the emergency department showed temperature of 100.3, respiration rate of 22, heart rate of 108, blood pressure 144/97, SPO2 of 89% on room air.  Serum sodium is 140, potassium 4.0, chloride of 103, bicarb 30, BUN of 12, serum creatinine 1.10, GFR greater than 60, nonfasting blood glucose 108, WBC 7.4, hemoglobin 14.9, platelets of 211.  ED treatment: Paxlovid, Decadron 10 mg IV one-time dose, albuterol 2.5 mg nebulizer.  Assessment and Plan: * Acute respiratory failure with hypoxia ([place]) Patient had a pulse ox of 89% on room air.  Today with ambulation pulse ox was 94%.  Patient wanted to go home.  Steroids prescribed yesterday and will continue prednisone upon discharge.  COVID-19 virus infection Finish a course of Paxlovid.  Prednisone taper prescribed.  Albuterol inhaler as needed.  As needed cough medication.  Essential hypertension - Amlodipine 10 mg daily, lisinopril increased to 20 mg daily.  We will add low-dose hydrochlorothiazide 12.5 mg daily also.  Blood pressure likely chronically high since the patient was out of his medications at home.  Obesity, Class III, BMI 40-49.9 (morbid obesity) ([facility]) BMI of 47.48.  I  recommended getting a sleep study as outpatient once feeling better to rule out sleep apnea.  Diarrhea Likely secondary to COVID-19 infection.  Tobacco abuse Patient must stop smoking or else he will be back in the hospital.         Consultants: None Procedures performed: None Disposition: Home Diet recommendation:  Cardiac diet DISCHARGE MEDICATION: Allergies as of 07/04/2022       Reactions   Dayquil [pseudoephedrine-apap-dm] Shortness Of Breath   Nyquil Multi-symptom [pseudoeph-doxylamine-dm-apap] Shortness Of Breath        Medication List     TAKE these medications    albuterol 108 (90 Base) MCG/ACT inhaler Commonly known as: VENTOLIN HFA Inhale 2 puffs into the lungs every 6 (six) hours as needed for wheezing or shortness of breath.   amLODipine 10 MG tablet Commonly known as: NORVASC Take 1 tablet (10 mg total) by mouth daily.   chlorpheniramine-HYDROcodone 10-8 MG/5ML Commonly known as: TUSSIONEX Take 5 mLs by mouth at bedtime as needed for cough.   hydrochlorothiazide 12.5 MG tablet Commonly known as: HYDRODIURIL Take 1 tablet (12.5 mg total) by mouth daily.   lisinopril 20 MG tablet Commonly known as: ZESTRIL Take 1 tablet (20 mg total) by mouth daily. What changed:  medication strength how much to take   nicotine 21 mg/24hr patch Commonly known as: NICODERM CQ - dosed in mg/24 hours One patch chest wall daily   nirmatrelvir/ritonavir EUA 20 x 150 MG & 10 x 100MG  Tabs Commonly known as: PAXLOVID Take 3 tablets by mouth 2 (two) times daily for 5 days. Patient GFR is >  60. Take nirmatrelvir (150 mg) two tablets twice daily for 5 days and ritonavir (100 mg) one tablet twice daily for 5 days.   predniSONE 10 MG tablet Commonly known as: DELTASONE 4 tabs po daily for four days then 3 tabs day5; 2 tabs day 6; 1 tab po day 7, 1/2 tab po day8,9        Follow-up Information     Jodi Marble, MD Follow up in 10 day(s).   Specialty:  Internal Medicine Contact information: [place] [address] [phone]                Discharge Exam: Danley Danker Weights   07/03/22 1146  Weight: (!) 158.8 kg   Physical Exam HENT:     Head: Normocephalic.     Mouth/Throat:     Pharynx: No oropharyngeal exudate.  Eyes:     General: Lids are normal.     Conjunctiva/sclera: Conjunctivae normal.  Cardiovascular:     Rate and Rhythm: Normal rate and regular rhythm.     Heart sounds: Normal heart sounds, S1 normal and S2 normal.  Pulmonary:     Breath sounds: Examination of the right-middle field reveals decreased breath sounds. Examination of the left-middle field reveals decreased breath sounds. Examination of the right-lower field reveals decreased breath sounds. Examination of the left-lower field reveals decreased breath sounds. Decreased breath sounds present. No wheezing, rhonchi or rales.  Abdominal:     Palpations: Abdomen is soft.     Tenderness: There is no abdominal tenderness.  Musculoskeletal:     Right lower leg: Swelling present.     Left lower leg: Swelling present.  Skin:    General: Skin is warm.     Findings: No rash.  Neurological:     Mental Status: He is alert and oriented to person, place, and time.      Condition at discharge: stable  The results of significant diagnostics from this hospitalization (including imaging, microbiology, ancillary and laboratory) are listed below for reference.   Imaging Studies: DG Chest 2 View  Result Date: 07/03/2022 CLINICAL DATA:  Chest pain. EXAM: CHEST - 2 VIEW COMPARISON:  April 22, 2022 FINDINGS: Cardiomediastinal silhouette is normal. Mediastinal contours appear intact. Possible minimal streaky opacities in bilateral lung bases. Osseous structures are without acute abnormality. Soft tissues are grossly normal. IMPRESSION: Possible minimal streaky opacities in bilateral lung bases may represent atelectasis or early peribronchial airspace  consolidation. Electronically Signed   By: Fidela Salisbury M.D.   On: 07/03/2022 12:36    Microbiology: Results for orders placed or performed during the hospital encounter of 07/03/22  SARS Coronavirus 2 by RT PCR (hospital order, performed in [facility] hospital lab) *cepheid single result test* Anterior Nasal Swab     Status: Abnormal   Collection Time: 07/03/22 12:01 PM   Specimen: Anterior Nasal Swab  Result Value Ref Range Status   SARS Coronavirus 2 by RT PCR POSITIVE (A) NEGATIVE Final    Comment: (NOTE) SARS-CoV-2 target nucleic acids are DETECTED  SARS-CoV-2 RNA is generally detectable in upper respiratory specimens  during the acute phase of infection.  Positive results are indicative  of the presence of the identified virus, but do not rule out bacterial infection or co-infection with other pathogens not detected by the test.  Clinical correlation with patient history and  other diagnostic information is necessary to determine patient infection status.  The expected result is negative.  Fact Sheet for Patients:   https://www.patel.info/  Fact Sheet for Healthcare Providers:   https://hall.com/    This test is not yet approved or cleared by the [place] FDA and  has been authorized for detection and/or diagnosis of SARS-CoV-2 by FDA under an Emergency Use Authorization (EUA).  This EUA will remain in effect (meaning this test can be used) for the duration of  the COVID-19 declaration under Section 564(b)(1)  of the Act, 21 U.S.C. section 360-bbb-3(b)(1), unless the authorization is terminated or revoked sooner.   Performed at [facility], [address] [place], [place] [address]   Resp Panel by RT-PCR (Flu A&B, Covid) Anterior Nasal Swab     Status: Abnormal   Collection Time: 07/03/22 12:01 PM   Specimen: Anterior Nasal Swab  Result Value Ref Range Status   SARS Coronavirus 2 by RT PCR POSITIVE (A)  NEGATIVE Final    Comment: (NOTE) SARS-CoV-2 target nucleic acids are DETECTED.  The SARS-CoV-2 RNA is generally detectable in upper respiratory specimens during the acute phase of infection. Positive results are indicative of the presence of the identified virus, but do not rule out bacterial infection or co-infection with other pathogens not detected by the test. Clinical correlation with patient history and other diagnostic information is necessary to determine patient infection status. The expected result is Negative.  Fact Sheet for Patients: EntrepreneurPulse.com.au  Fact Sheet for Healthcare Providers: IncredibleEmployment.be  This test is not yet approved or cleared by the [place] FDA and  has been authorized for detection and/or diagnosis of SARS-CoV-2 by FDA under an Emergency Use Authorization (EUA).  This EUA will remain in effect (meaning this test can be used) for the duration of  the COVID-19 declaration under Section 564(b)(1) of the A ct, 21 U.S.C. section 360bbb-3(b)(1), unless the authorization is terminated or revoked sooner.     Influenza A by PCR NEGATIVE NEGATIVE Final   Influenza B by PCR NEGATIVE NEGATIVE Final    Comment: (NOTE) The Xpert Xpress SARS-CoV-2/FLU/RSV plus assay is intended as an aid in the diagnosis of influenza from Nasopharyngeal swab specimens and should not be used as a sole basis for treatment. Nasal washings and aspirates are unacceptable for Xpert Xpress SARS-CoV-2/FLU/RSV testing.  Fact Sheet for Patients: EntrepreneurPulse.com.au  Fact Sheet for Healthcare Providers: IncredibleEmployment.be  This test is not yet approved or cleared by the [place] FDA and has been authorized for detection and/or diagnosis of SARS-CoV-2 by FDA under an Emergency Use Authorization (EUA). This EUA will remain in effect (meaning this test can be used) for the  duration of the COVID-19 declaration under Section 564(b)(1) of the Act, 21 U.S.C. section 360bbb-3(b)(1), unless the authorization is terminated or revoked.  Performed at [facility], [address] [place], [place] [address]     Labs: CBC: Recent Labs  Lab 07/03/22 1201 07/04/22 0557  WBC 7.4 6.0  NEUTROABS  --  4.9  HGB 14.9 16.6  HCT 50.3 54.9*  MCV 91.3 88.8  PLT 211 027   Basic Metabolic Panel: Recent Labs  Lab 07/03/22 1201 07/04/22 0557  NA 140 141  K 4.0 4.4  CL 103 104  CO2 30 28  GLUCOSE 108* 163*  BUN 12 11  CREATININE 1.10 0.83  CALCIUM 8.8* 9.2   Liver Function Tests: Recent Labs  Lab 07/04/22 0557  AST 34  ALT 48*  ALKPHOS 71  BILITOT 0.7  PROT 8.6*  ALBUMIN 4.3     Discharge time spent: greater than 30 minutes.  Signed: Delfino Lovett  Leslye Peer, MD [facility] 07/04/2022

## 2022-07-04 NOTE — Assessment & Plan Note (Signed)
Patient must stop smoking or else he will be back in the hospital.

## 2022-09-13 ENCOUNTER — Ambulatory Visit (INDEPENDENT_AMBULATORY_CARE_PROVIDER_SITE_OTHER): Payer: Managed Care, Other (non HMO) | Admitting: Pulmonary Disease

## 2022-09-13 ENCOUNTER — Encounter: Payer: Self-pay | Admitting: Pulmonary Disease

## 2022-09-13 VITALS — BP 152/86 | HR 88 | Temp 98.0°F | Ht 72.0 in | Wt 330.0 lb

## 2022-09-13 DIAGNOSIS — R0683 Snoring: Secondary | ICD-10-CM

## 2022-09-13 DIAGNOSIS — R9389 Abnormal findings on diagnostic imaging of other specified body structures: Secondary | ICD-10-CM

## 2022-09-13 NOTE — Patient Instructions (Signed)
Schedule patient for sleep study  In lab study  Weight loss efforts  Regular exercises and diet changes  Call us with significant concerns  Tentative follow-up in 3 months  Sleep Apnea Sleep apnea affects breathing during sleep. It causes breathing to stop for 10 seconds or more, or to become shallow. People with sleep apnea usually snore loudly. It can also increase the risk of: Heart attack. Stroke. Being very overweight (obese). Diabetes. Heart failure. Irregular heartbeat. High blood pressure. The goal of treatment is to help you breathe normally again. What are the causes?  The most common cause of this condition is a collapsed or blocked airway. There are three kinds of sleep apnea: Obstructive sleep apnea. This is caused by a blocked or collapsed airway. Central sleep apnea. This happens when the brain does not send the right signals to the muscles that control breathing. Mixed sleep apnea. This is a combination of obstructive and central sleep apnea. What increases the risk? Being overweight. Smoking. Having a small airway. Being older. Being male. Drinking alcohol. Taking medicines to calm yourself (sedatives or tranquilizers). Having family members with the condition. Having a tongue or tonsils that are larger than normal. What are the signs or symptoms? Trouble staying asleep. Loud snoring. Headaches in the morning. Waking up gasping. Dry mouth or sore throat in the morning. Being sleepy or tired during the day. If you are sleepy or tired during the day, you may also: Not be able to focus your mind (concentrate). Forget things. Get angry a lot and have mood swings. Feel sad (depressed). Have changes in your personality. Have less interest in sex, if you are male. Be unable to have an erection, if you are male. How is this treated?  Sleeping on your side. Using a medicine to get rid of mucus in your nose (decongestant). Avoiding the use of  alcohol, medicines to help you relax, or certain pain medicines (narcotics). Losing weight, if needed. Changing your diet. Quitting smoking. Using a machine to open your airway while you sleep, such as: An oral appliance. This is a mouthpiece that shifts your lower jaw forward. A CPAP device. This device blows air through a mask when you breathe out (exhale). An EPAP device. This has valves that you put in each nostril. A BIPAP device. This device blows air through a mask when you breathe in (inhale) and breathe out. Having surgery if other treatments do not work. Follow these instructions at home: Lifestyle Make changes that your doctor recommends. Eat a healthy diet. Lose weight if needed. Avoid alcohol, medicines to help you relax, and some pain medicines. Do not smoke or use any products that contain nicotine or tobacco. If you need help quitting, ask your doctor. General instructions Take over-the-counter and prescription medicines only as told by your doctor. If you were given a machine to use while you sleep, use it only as told by your doctor. If you are having surgery, make sure to tell your doctor you have sleep apnea. You may need to bring your device with you. Keep all follow-up visits. Contact a doctor if: The machine that you were given to use during sleep bothers you or does not seem to be working. You do not get better. You get worse. Get help right away if: Your chest hurts. You have trouble breathing in enough air. You have an uncomfortable feeling in your back, arms, or stomach. You have trouble talking. One side of your body feels weak. A part  of your face is hanging down. These symptoms may be an emergency. Get help right away. Call your local emergency services (911 in the [place] Do not wait to see if the symptoms will go away. Do not drive yourself to the hospital. Summary This condition affects breathing during sleep. The most common cause is a collapsed  or blocked airway. The goal of treatment is to help you breathe normally while you sleep. This information is not intended to replace advice given to you by your health care provider. Make sure you discuss any questions you have with your health care provider. Document Revised: 05/03/2021 Document Reviewed: 09/02/2020 Elsevier Patient Education  2023 [employer].

## 2022-09-13 NOTE — Progress Notes (Signed)
Ryan Maxwell    [id number]    [birth year]  Primary Care Physician:Pcp, No  Referring Physician: No referring provider defined for this encounter.  Chief complaint:   Patient with snoring, witnessed apneas  HPI:  Snoring witnessed apneas, excessive daytime sleepiness  Has always snored  Bedtime varies Falls asleep immediately 3-4 awakenings Works third shift  Has gained over 50 pounds in the last couple years  He is tired all the time, sleepy all the time Admits to headaches in the morning Admits to dryness of his mouth in the morning  Mom snored  An active smoker about a pack a day  Has a history of hypertension that is well-controlled   Outpatient Encounter Medications as of 09/13/2022  Medication Sig   albuterol (VENTOLIN HFA) 108 (90 Base) MCG/ACT inhaler Inhale 2 puffs into the lungs every 6 (six) hours as needed for wheezing or shortness of breath.   chlorpheniramine-HYDROcodone (TUSSIONEX) 10-8 MG/5ML Take 5 mLs by mouth at bedtime as needed for cough.   nicotine (NICODERM CQ - DOSED IN MG/24 HOURS) 21 mg/24hr patch One patch chest wall daily   amLODipine (NORVASC) 10 MG tablet Take 1 tablet (10 mg total) by mouth daily.   hydrochlorothiazide (HYDRODIURIL) 12.5 MG tablet Take 1 tablet (12.5 mg total) by mouth daily.   lisinopril (ZESTRIL) 20 MG tablet Take 1 tablet (20 mg total) by mouth daily.   predniSONE (DELTASONE) 10 MG tablet 4 tabs po daily for four days then 3 tabs day5; 2 tabs day 6; 1 tab po day 7, 1/2 tab po day8,9 (Patient not taking: Reported on 09/13/2022)   No facility-administered encounter medications on file as of 09/13/2022.    Allergies as of 09/13/2022 - Review Complete 09/13/2022  Allergen Reaction Noted   Dayquil [pseudoephedrine-apap-dm] Shortness Of Breath 11/09/2015   Nyquil multi-symptom [pseudoeph-doxylamine-dm-apap] Shortness Of Breath 09/03/2016    Past Medical History:  Diagnosis Date   Acute respiratory  failure with hypoxia (HCC) 03/06/2020   Bronchitis    Hypertension    Pneumonia    Tobacco abuse     History reviewed. No pertinent surgical history.  Family History  Problem Relation Age of Onset   Hypertension Mother    Hypothyroidism Mother    Hypertension Other     Social History   Socioeconomic History   Marital status: Single    Spouse name: Not on file   Number of children: Not on file   Years of education: Not on file   Highest education level: Not on file  Occupational History   Not on file  Tobacco Use   Smoking status: Every Day    Packs/day: 1.00    Years: 16.00    Total pack years: 16.00    Types: Cigarettes   Smokeless tobacco: Never  Vaping Use   Vaping Use: Never used  Substance and Sexual Activity   Alcohol use: Not Currently   Drug use: Yes    Types: Marijuana   Sexual activity: Yes  Other Topics Concern   Not on file  Social History Narrative   Not on file   Social Determinants of Health   Financial Resource Strain: Not on file  Food Insecurity: No Food Insecurity (07/03/2022)   Hunger Vital Sign    Worried About Running Out of Food in the Last Year: Never true    Ran Out of Food in the Last Year: Never true  Transportation Needs: No Transportation Needs (07/03/2022)  PRAPARE - Hydrologist (Medical): No    Lack of Transportation (Non-Medical): No  Physical Activity: Not on file  Stress: Not on file  Social Connections: Not on file  Intimate Partner Violence: Not At Risk (07/03/2022)   Humiliation, Afraid, Rape, and Kick questionnaire    Fear of Current or Ex-Partner: No    Emotionally Abused: No    Physically Abused: No    Sexually Abused: No    Review of Systems  Psychiatric/Behavioral:  Positive for sleep disturbance.     Vitals:   09/13/22 1053  BP: (!) 152/86  Pulse: 88  Temp: 98 F (36.7 C)  SpO2: 94%     Physical Exam Constitutional:      Appearance: He is obese.  HENT:     Head:  Normocephalic.     Nose: Nose normal.     Mouth/Throat:     Mouth: Mucous membranes are moist.     Comments: Mallampati 3, crowded oropharynx Eyes:     Pupils: Pupils are equal, round, and reactive to light.  Cardiovascular:     Rate and Rhythm: Normal rate and regular rhythm.     Heart sounds: No murmur heard.    No friction rub.  Pulmonary:     Effort: No respiratory distress.     Breath sounds: No stridor. No wheezing or rhonchi.  Musculoskeletal:     Cervical back: No rigidity or tenderness.  Neurological:     Mental Status: He is alert.  Psychiatric:        Mood and Affect: Mood normal.       09/13/2022   10:00 AM  Results of the Epworth flowsheet  Sitting and reading 3  Watching TV 3  Sitting, inactive in a public place (e.g. a theatre or a meeting) 3  As a passenger in a car for an hour without a break 3  Lying down to rest in the afternoon when circumstances permit 3  Sitting and talking to someone 3  Sitting quietly after a lunch without alcohol 3  In a car, while stopped for a few minutes in traffic 1  Total score 22     Data Reviewed: No previous sleep study on record  Assessment:  Moderate probability of significant obstructive sleep apnea  Abnormal CT scan of the chest showing adenopathy  Class III obesity  Risk for obesity hypoventilation syndrome because of his heavy weight  Excessive daytime sleepiness  Active smoker  Plan/Recommendations: Schedule patient for split-night study  Repeat CT scan of the chest to follow-up on adenopathy noted on previous CT which was reviewed by myself  Tentative follow-up in about 3 to 4 months  Weight loss efforts encouraged  Smoking cessation counseling  Encouraged to call with any significant concerns   Sherrilyn Rist MD [facility] 09/13/2022, 11:02 AM  CC: No ref. provider found

## 2022-09-22 ENCOUNTER — Encounter: Payer: Self-pay | Admitting: Pulmonary Disease

## 2022-09-25 ENCOUNTER — Inpatient Hospital Stay: Admission: RE | Admit: 2022-09-25 | Payer: Managed Care, Other (non HMO) | Source: Ambulatory Visit

## 2022-10-09 ENCOUNTER — Ambulatory Visit
Admission: RE | Admit: 2022-10-09 | Discharge: 2022-10-09 | Disposition: A | Discharge status: Home / Self Care | Payer: Managed Care, Other (non HMO) | Source: Ambulatory Visit | Attending: Pulmonary Disease | Admitting: Pulmonary Disease

## 2022-10-09 DIAGNOSIS — R9389 Abnormal findings on diagnostic imaging of other specified body structures: Secondary | ICD-10-CM

## 2022-10-09 MED ORDER — IOPAMIDOL (ISOVUE-300) INJECTION 61%
75.0000 mL | Freq: Once | INTRAVENOUS | Status: AC | PRN
  Administered 2022-10-09: 75 mL via INTRAVENOUS

## 2022-12-11 ENCOUNTER — Emergency Department: Payer: Managed Care, Other (non HMO)

## 2022-12-11 ENCOUNTER — Other Ambulatory Visit: Payer: Self-pay

## 2022-12-11 ENCOUNTER — Inpatient Hospital Stay
Admission: EM | Admit: 2022-12-11 | Discharge: 2022-12-18 | DRG: 189 | Disposition: A | Discharge status: Home / Self Care | Payer: Managed Care, Other (non HMO) | Attending: Internal Medicine | Admitting: Internal Medicine

## 2022-12-11 DIAGNOSIS — Z6841 Body Mass Index (BMI) 40.0 and over, adult: Secondary | ICD-10-CM | POA: Diagnosis not present

## 2022-12-11 DIAGNOSIS — E8729 Other acidosis: Secondary | ICD-10-CM | POA: Diagnosis present

## 2022-12-11 DIAGNOSIS — Z8249 Family history of ischemic heart disease and other diseases of the circulatory system: Secondary | ICD-10-CM

## 2022-12-11 DIAGNOSIS — I10 Essential (primary) hypertension: Secondary | ICD-10-CM | POA: Diagnosis present

## 2022-12-11 DIAGNOSIS — J9602 Acute respiratory failure with hypercapnia: Secondary | ICD-10-CM | POA: Diagnosis present

## 2022-12-11 DIAGNOSIS — Z888 Allergy status to other drugs, medicaments and biological substances status: Secondary | ICD-10-CM | POA: Diagnosis not present

## 2022-12-11 DIAGNOSIS — G4710 Hypersomnia, unspecified: Secondary | ICD-10-CM | POA: Diagnosis present

## 2022-12-11 DIAGNOSIS — J40 Bronchitis, not specified as acute or chronic: Secondary | ICD-10-CM | POA: Diagnosis not present

## 2022-12-11 DIAGNOSIS — J9601 Acute respiratory failure with hypoxia: Secondary | ICD-10-CM | POA: Diagnosis present

## 2022-12-11 DIAGNOSIS — R600 Localized edema: Secondary | ICD-10-CM | POA: Diagnosis present

## 2022-12-11 DIAGNOSIS — D72829 Elevated white blood cell count, unspecified: Secondary | ICD-10-CM | POA: Diagnosis not present

## 2022-12-11 DIAGNOSIS — E875 Hyperkalemia: Secondary | ICD-10-CM | POA: Diagnosis present

## 2022-12-11 DIAGNOSIS — Z720 Tobacco use: Secondary | ICD-10-CM | POA: Diagnosis not present

## 2022-12-11 DIAGNOSIS — F17210 Nicotine dependence, cigarettes, uncomplicated: Secondary | ICD-10-CM | POA: Diagnosis present

## 2022-12-11 DIAGNOSIS — U071 Acute respiratory failure, unspecified whether with hypoxia or hypercapnia: Secondary | ICD-10-CM | POA: Diagnosis present

## 2022-12-11 DIAGNOSIS — Z8616 Personal history of COVID-19: Secondary | ICD-10-CM

## 2022-12-11 DIAGNOSIS — Z79899 Other long term (current) drug therapy: Secondary | ICD-10-CM | POA: Diagnosis not present

## 2022-12-11 DIAGNOSIS — J9600 Acute respiratory failure, unspecified whether with hypoxia or hypercapnia: Secondary | ICD-10-CM | POA: Diagnosis present

## 2022-12-11 DIAGNOSIS — G4733 Obstructive sleep apnea (adult) (pediatric): Secondary | ICD-10-CM | POA: Diagnosis present

## 2022-12-11 DIAGNOSIS — J9621 Acute and chronic respiratory failure with hypoxia: Secondary | ICD-10-CM | POA: Diagnosis present

## 2022-12-11 DIAGNOSIS — R079 Chest pain, unspecified: Secondary | ICD-10-CM | POA: Diagnosis present

## 2022-12-11 LAB — CBC WITH DIFFERENTIAL/PLATELET
Abs Immature Granulocytes: 0.14 10*3/uL — ABNORMAL HIGH (ref 0.00–0.07)
Basophils Absolute: 0.1 10*3/uL (ref 0.0–0.1)
Basophils Relative: 1 %
Eosinophils Absolute: 0.1 10*3/uL (ref 0.0–0.5)
Eosinophils Relative: 1 %
HCT: 53.5 % — ABNORMAL HIGH (ref 39.0–52.0)
Hemoglobin: 15.7 g/dL (ref 13.0–17.0)
Immature Granulocytes: 2 %
Lymphocytes Relative: 27 %
Lymphs Abs: 2.2 10*3/uL (ref 0.7–4.0)
MCH: 26.8 pg (ref 26.0–34.0)
MCHC: 29.3 g/dL — ABNORMAL LOW (ref 30.0–36.0)
MCV: 91.5 fL (ref 80.0–100.0)
Monocytes Absolute: 0.9 10*3/uL (ref 0.1–1.0)
Monocytes Relative: 12 %
Neutro Abs: 4.7 10*3/uL (ref 1.7–7.7)
Neutrophils Relative %: 57 %
Platelets: 231 10*3/uL (ref 150–400)
RBC: 5.85 MIL/uL — ABNORMAL HIGH (ref 4.22–5.81)
RDW: 14 % (ref 11.5–15.5)
WBC: 8.1 10*3/uL (ref 4.0–10.5)
nRBC: 0 % (ref 0.0–0.2)

## 2022-12-11 LAB — COMPREHENSIVE METABOLIC PANEL
ALT: 45 U/L — ABNORMAL HIGH (ref 0–44)
AST: 34 U/L (ref 15–41)
Albumin: 3.6 g/dL (ref 3.5–5.0)
Alkaline Phosphatase: 67 U/L (ref 38–126)
Anion gap: 6 (ref 5–15)
BUN: 16 mg/dL (ref 6–20)
CO2: 29 mmol/L (ref 22–32)
Calcium: 8.3 mg/dL — ABNORMAL LOW (ref 8.9–10.3)
Chloride: 102 mmol/L (ref 98–111)
Creatinine, Ser: 0.87 mg/dL (ref 0.61–1.24)
GFR, Estimated: >60 mL/min (ref >60)
Glucose, Bld: 159 mg/dL — ABNORMAL HIGH (ref 70–99)
Potassium: 4.2 mmol/L (ref 3.5–5.1)
Sodium: 137 mmol/L (ref 135–145)
Total Bilirubin: 0.4 mg/dL (ref 0.3–1.2)
Total Protein: 7.1 g/dL (ref 6.5–8.1)

## 2022-12-11 LAB — BRAIN NATRIURETIC PEPTIDE: B Natriuretic Peptide: 31.2 pg/mL (ref 0.0–100.0)

## 2022-12-11 LAB — BLOOD GAS, VENOUS
Acid-Base Excess: 9.4 mmol/L — ABNORMAL HIGH (ref 0.0–2.0)
Bicarbonate: 38.6 mmol/L — ABNORMAL HIGH (ref 20.0–28.0)
O2 Saturation: 78.5 %
Patient temperature: 37
pCO2, Ven: 75 mmHg (ref 44–60)
pH, Ven: 7.32 (ref 7.25–7.43)
pO2, Ven: 49 mmHg — ABNORMAL HIGH (ref 32–45)

## 2022-12-11 LAB — RESP PANEL BY RT-PCR (RSV, FLU A&B, COVID)  RVPGX2
Influenza A by PCR: NEGATIVE
Influenza B by PCR: NEGATIVE
Resp Syncytial Virus by PCR: NEGATIVE
SARS Coronavirus 2 by RT PCR: NEGATIVE

## 2022-12-11 LAB — TROPONIN I (HIGH SENSITIVITY)
Troponin I (High Sensitivity): 17 ng/L (ref <18)
Troponin I (High Sensitivity): 15 ng/L (ref <18)

## 2022-12-11 LAB — D-DIMER, QUANTITATIVE: D-Dimer, Quant: 0.48 ug/mL-FEU (ref 0.00–0.50)

## 2022-12-11 LAB — HIV ANTIBODY (ROUTINE TESTING W REFLEX): HIV Screen 4th Generation wRfx: NONREACTIVE

## 2022-12-11 LAB — PROCALCITONIN: Procalcitonin: <0.1 ng/mL

## 2022-12-11 MED ORDER — PREDNISONE 20 MG PO TABS: 40.0000 mg | ORAL_TABLET | Freq: Every day | ORAL | Status: DC

## 2022-12-11 MED ORDER — HYDRALAZINE HCL 20 MG/ML IJ SOLN
10.0000 mg | Freq: Once | INTRAMUSCULAR | Status: AC
  Administered 2022-12-11: 10 mg via INTRAVENOUS
  Filled 2022-12-11: qty 1

## 2022-12-11 MED ORDER — SODIUM CHLORIDE 0.9 % IV SOLN
100.0000 mg | Freq: Two times a day (BID) | INTRAVENOUS | Status: DC
  Administered 2022-12-11 – 2022-12-13 (×5): 100 mg via INTRAVENOUS
  Filled 2022-12-11 (×5): qty 100

## 2022-12-11 MED ORDER — ONDANSETRON HCL 4 MG/2ML IJ SOLN: 4.0000 mg | Freq: Four times a day (QID) | INTRAMUSCULAR | Status: DC | PRN

## 2022-12-11 MED ORDER — NICOTINE 21 MG/24HR TD PT24
21.0000 mg | MEDICATED_PATCH | Freq: Every day | TRANSDERMAL | Status: DC
  Filled 2022-12-11 (×5): qty 1

## 2022-12-11 MED ORDER — METHYLPREDNISOLONE SODIUM SUCC 125 MG IJ SOLR: 125.0000 mg | Freq: Once | INTRAMUSCULAR | Status: DC

## 2022-12-11 MED ORDER — LORAZEPAM 0.5 MG PO TABS
0.2500 mg | ORAL_TABLET | ORAL | Status: AC
  Administered 2022-12-11: 0.25 mg via ORAL
  Filled 2022-12-11: qty 1

## 2022-12-11 MED ORDER — METHYLPREDNISOLONE SODIUM SUCC 125 MG IJ SOLR
60.0000 mg | Freq: Once | INTRAMUSCULAR | Status: AC
  Administered 2022-12-11: 60 mg via INTRAVENOUS
  Filled 2022-12-11: qty 2

## 2022-12-11 MED ORDER — IPRATROPIUM-ALBUTEROL 0.5-2.5 (3) MG/3ML IN SOLN: 3.0000 mL | RESPIRATORY_TRACT | Status: DC | PRN

## 2022-12-11 MED ORDER — IPRATROPIUM-ALBUTEROL 0.5-2.5 (3) MG/3ML IN SOLN
3.0000 mL | Freq: Once | RESPIRATORY_TRACT | Status: AC
  Administered 2022-12-11: 3 mL via RESPIRATORY_TRACT
  Filled 2022-12-11: qty 3

## 2022-12-11 MED ORDER — ONDANSETRON HCL 4 MG PO TABS: 4.0000 mg | ORAL_TABLET | Freq: Four times a day (QID) | ORAL | Status: DC | PRN

## 2022-12-11 MED ORDER — METHYLPREDNISOLONE SODIUM SUCC 125 MG IJ SOLR
65.0000 mg | Freq: Once | INTRAMUSCULAR | Status: AC
  Administered 2022-12-11: 65 mg via INTRAVENOUS
  Filled 2022-12-11: qty 2

## 2022-12-11 MED ORDER — SODIUM CHLORIDE 0.9 % IV SOLN: INTRAVENOUS | Status: DC

## 2022-12-11 MED ORDER — PREDNISONE 20 MG PO TABS
40.0000 mg | ORAL_TABLET | Freq: Every day | ORAL | Status: AC
  Administered 2022-12-12 – 2022-12-15 (×4): 40 mg via ORAL
  Filled 2022-12-11: qty 2
  Filled 2022-12-11 (×3): qty 4

## 2022-12-11 MED ORDER — ENOXAPARIN SODIUM 100 MG/ML IJ SOSY
0.5000 mg/kg | PREFILLED_SYRINGE | INTRAMUSCULAR | Status: DC
  Administered 2022-12-12 – 2022-12-14 (×3): 85 mg via SUBCUTANEOUS
  Filled 2022-12-11 (×4): qty 0.85

## 2022-12-11 NOTE — Assessment & Plan Note (Signed)
BMI is increased to 51.5

## 2022-12-11 NOTE — Progress Notes (Signed)
PHARMACIST - PHYSICIAN COMMUNICATION  CONCERNING:  Enoxaparin (Lovenox) for DVT Prophylaxis   DESCRIPTION: Patient was prescribed enoxaprin '40mg'$  q24 hours for VTE prophylaxis.   Filed Weights   12/11/22 0749  Weight: (!) 172.4 kg (380 lb)    Body mass index is 51.54 kg/m.  Estimated Creatinine Clearance: 193.6 mL/min (by C-G formula based on SCr of 0.87 mg/dL).   Based on [place] patient is candidate for enoxaparin 0.'5mg'$ /kg TBW SQ every 24 hours based on BMI being >30.   RECOMMENDATION: Pharmacy has adjusted enoxaparin dose per [facility] policy.  Patient is now receiving enoxaparin 85 mg every 24 hours    Vick Frees, PharmD Clinical Pharmacist  12/11/2022 10:01 AM

## 2022-12-11 NOTE — ED Notes (Signed)
Pt IV infilitrated. RN removed and placed gauze dressing on hand. RN placed new IV.

## 2022-12-11 NOTE — Progress Notes (Signed)
Received report from nursing about patient asking to go home. I was able to talk to patient over the phone. Discussed with the patient the importance of addressing hypoxia overnight as there is a significant risk of adverse outcome including death if hypoxia persists as it initially presented Patient expressed concern about work Discussed with patient that work is a much lower priority at present given critical hypoxia Asked patient if he was family that was available to reach out to work about his illness.  Patient reported that his father may be able to do so. Patient is agreeable to continue monitoring overnight I again expressed importance of compliance with oxygenation regimen as this has been a recurring issue Patient expressed understanding and is agreeable to stay overnight in the hospital

## 2022-12-11 NOTE — Assessment & Plan Note (Signed)
Per report, patient with left-sided chest pain associated with coughing in the setting of acute respiratory failure with hypoxia Troponin negative x 1. EKG sinus tach D-dimer negative Will otherwise follow for now Reassess at presenting decline from a cardiac or respiratory standpoint Follow

## 2022-12-11 NOTE — ED Notes (Signed)
RN to bedside to collect labs/swabs since pt on high flow. Pt states "I want to go home, I feel great. I have a little boy at home I want to go. I have to work and I can't afford to lose my job" RN advised pt to wait to talk to the Dr. Shelly Bombard notified MD Ernestina Patches.

## 2022-12-11 NOTE — ED Notes (Signed)
Pt on HFNC.

## 2022-12-11 NOTE — Assessment & Plan Note (Signed)
Recurring issue in the setting of untreated obstructive sleep apnea Patient is known to have been admitted multiple times for similar issues Consider sleep study in house if available so as to patient receiving CPAP prior to discharge Follow

## 2022-12-11 NOTE — Assessment & Plan Note (Signed)
Recurring issue with patient not having yet obtained outpatient sleep study With benefit from a patient sleep study if possible and discharge with CPAP Follow

## 2022-12-11 NOTE — Assessment & Plan Note (Signed)
BP 140s to 160s over 100s Titrate home BP regimen Follow

## 2022-12-11 NOTE — Progress Notes (Signed)
PT Cancellation Note  Patient Details Name: Ryan Maxwell MRN: [id number] DOB: 29-May-1986   Cancelled Treatment:    Reason Eval/Treat Not Completed: Patient not medically ready (Chart reviewed, RN consulted. Pt's respiratory status has not been optimized yet, difficulty maintaining saturations/tolerating delivery systems. WIll continue to follow, defer evaluation to later date/time.)    1:43 PM, 12/11/22 Etta Grandchild, PT, DPT Physical Therapist - [facility]  [phone] ([place])    [place] 12/11/2022, 1:43 PM

## 2022-12-11 NOTE — ED Notes (Signed)
RN at bedside to assess pt. Pt asleep upon RN arrival, pt then woke up saying "I did what yall wanted so when are yall going to let me eat" Pt has arbys bag by him. Pt states "I got enough oxygen this been going on for months" RN explained the reasoning for bipap and the constant dropping of pt o2. RN notified MD Ernestina Patches to assess if switching to high flow or what is next step.

## 2022-12-11 NOTE — ED Notes (Signed)
Per MD Ernestina Patches, place pt back on HFNC to eat and reassess pt after he eats to see if needing to be placed back on bipap. RN called respiratory to notify them to switch pt back to HFNC.

## 2022-12-11 NOTE — H&P (Addendum)
History and Physical    Patient: Ryan Maxwell [id number] DOB: [birth year] DOA: 12/11/2022 DOS: the patient was seen and examined on 12/11/2022 PCP: Pcp, No  Patient coming from: Home  Chief Complaint:  Chief Complaint  Patient presents with   Cough   Chest Pain   HPI: Ryan Maxwell is a 37 y.o. male with medical history significant of obesity, tobacco abuse, hypertension, OSA pending sleep study presenting with acute respiratory failure with hypoxia.  Limited history as the patient is sleeping on BiPAP.  Per report, patient with approximately 3 days of rhinorrhea, nasal congestion, cough.  Also with some left-sided chest pain associated with coughing.  Positive sick contact and is a mouth also currently with a viral illness.  Patient noted had multiple admissions for similar issues over the past 3 years.  Many of these admissions requiring BiPAP.  There has been some concern for obstructive sleep apnea in the setting of morbid obesity.  Patient has yet to have had a sleep study done.  Per report,  patient still smoking 1 pack/day.  No reported fevers or chills. Presented to the ER Tmax 99.1, heart rate in the 100s, blood pressure 140s to 160s over 100s.  Satting in the 80s room air.  Transition from 4 L nasal cannula to 40% BiPAP.  White count 8.1, hemoglobin 15.7.  Creatinine 0.9.  D-dimer within normal limits.  COVID, flu and RSV negative.  Troponin negative x 1.  EKG was sinus tachycardia.  ABG with a compensated respiratory acidosis.  Chest x-ray grossly within normal limits.  Given IV Solu-Medrol, DuoNeb in the ER.   review of Systems: As mentioned in the history of present illness. All other systems reviewed and are negative. Past Medical History:  Diagnosis Date   Acute respiratory failure with hypoxia ([place]) 03/06/2020   Bronchitis    Hypertension    Pneumonia    Tobacco abuse    No past surgical history on file. Social History:  reports that he has been smoking  cigarettes. He has a 16.00 pack-year smoking history. He has never used smokeless tobacco. He reports that he does not currently use alcohol. He reports current drug use. Drug: Marijuana.  Allergies  Allergen Reactions   Dayquil [Pseudoephedrine-Apap-Dm] Shortness Of Breath   Nyquil Multi-Symptom [Pseudoeph-Doxylamine-Dm-Apap] Shortness Of Breath    Family History  Problem Relation Age of Onset   Hypertension Mother    Hypothyroidism Mother    Hypertension Other     Prior to Admission medications   Medication Sig Start Date End Date Taking? Authorizing Provider  albuterol (VENTOLIN HFA) 108 (90 Base) MCG/ACT inhaler Inhale 2 puffs into the lungs every 6 (six) hours as needed for wheezing or shortness of breath. 07/04/22   Loletha Grayer, MD  amLODipine (NORVASC) 10 MG tablet Take 1 tablet (10 mg total) by mouth daily. 07/04/22 09/02/22  Loletha Grayer, MD  chlorpheniramine-HYDROcodone (TUSSIONEX) 10-8 MG/5ML Take 5 mLs by mouth at bedtime as needed for cough. 07/04/22   Loletha Grayer, MD  hydrochlorothiazide (HYDRODIURIL) 12.5 MG tablet Take 1 tablet (12.5 mg total) by mouth daily. 07/04/22 09/02/22  Loletha Grayer, MD  lisinopril (ZESTRIL) 20 MG tablet Take 1 tablet (20 mg total) by mouth daily. 07/04/22 09/02/22  Loletha Grayer, MD  nicotine (NICODERM CQ - DOSED IN MG/24 HOURS) 21 mg/24hr patch One patch chest wall daily 07/04/22   Loletha Grayer, MD  predniSONE (DELTASONE) 10 MG tablet 4 tabs po daily for four days then 3 tabs day5; 2  tabs day 6; 1 tab po day 7, 1/2 tab po day8,9 Patient not taking: Reported on 09/13/2022 07/04/22   Loletha Grayer, MD    Physical Exam: Vitals:   12/11/22 0822 12/11/22 0830 12/11/22 0900 12/11/22 0949  BP:  (!) 146/109 (!) 147/102   Pulse:  97 95   Resp: (!) 21 (!) 21 11   Temp:      SpO2: 99% 93% 93% 95%  Weight:      Height:       Physical Exam Constitutional:      Comments: Morbid obesity  HENT:     Head: Normocephalic and  atraumatic.     Comments: BiPAP in place    Nose: Nose normal.  Eyes:     Pupils: Pupils are equal, round, and reactive to light.  Cardiovascular:     Rate and Rhythm: Normal rate and regular rhythm.  Pulmonary:     Effort: Pulmonary effort is normal.     Breath sounds: Wheezing present.  Abdominal:     Comments: Obese abdomen  Musculoskeletal:        General: Normal range of motion.     Cervical back: Normal range of motion.     Right lower leg: Edema present.     Left lower leg: Edema present.  Skin:    General: Skin is warm.  Neurological:     General: No focal deficit present.     Comments: Patient sleeping on BiPAP  Psychiatric:        Mood and Affect: Mood normal.     Data Reviewed:  There are no new results to review at this time. DG Chest Port 1 View CLINICAL DATA:  [id number] Chest pain [id number]  EXAM: PORTABLE CHEST 1 VIEW  COMPARISON:  Chest XR, most recently 06/02/2022 and 04/22/2022. CT chest, 10/09/2022.  FINDINGS: Cardiomediastinal silhouette is within normal limits given degree of inflation. Mild relative hypoinflation with trace linear streaky perihilar and basilar opacities. No focal consolidation or mass. No pleural effusion or pneumothorax. No acute displaced fracture.  IMPRESSION: Mild hypoinflation without acute superimposed cardiopulmonary process.  Electronically Signed   By: Michaelle Birks M.D.   On: 12/11/2022 08:33   Lab Results  Component Value Date   WBC 8.1 12/11/2022   HGB 15.7 12/11/2022   HCT 53.5 (H) 12/11/2022   MCV 91.5 12/11/2022   PLT 231 [id number]   Last metabolic panel Lab Results  Component Value Date   GLUCOSE 159 (H) 12/11/2022   NA 137 12/11/2022   K 4.2 12/11/2022   CL 102 12/11/2022   CO2 29 12/11/2022   BUN 16 12/11/2022   CREATININE 0.87 12/11/2022   GFRNONAA >60 12/11/2022   CALCIUM 8.3 (L) 12/11/2022   PHOS 4.1 07/27/2021   PROT 7.1 12/11/2022   ALBUMIN 3.6 12/11/2022   BILITOT 0.4 12/11/2022    ALKPHOS 67 12/11/2022   AST 34 12/11/2022   ALT 45 (H) 12/11/2022   ANIONGAP 6 12/11/2022    Assessment and Plan: * Acute respiratory failure with hypoxia (HCC) Suspect secondary to possible viral illness/bronchitis with chronic contributions including obesity hypoventilation syndrome/OSA not on CPAP, tobacco abuse, likely subacute versus undiagnosed obstructive lung disease Positive rhinorrhea, nasal congestion for 2 to 3 days per report with noted sick contact and young son with similar symptoms Requiring BiPAP in the ER Noted compensated respiratory acidosis on ABG Chest x-ray within normal limits Covid, flu, RSV negative  Positive wheezing on exam Will place on course of IV Solu-Medrol,  IV doxycycline, DuoNebs Continue BiPAP for now Expanded respiratory virus panel Was pending outpatient sleep study that has never been done May benefit from formal sleep study while in house to expedite home CPAP Follow  Essential hypertension BP 140s to 160s over 100s Titrate home BP regimen Follow  Obesity, Class III, BMI 40-49.9 (morbid obesity) (HCC) BMI is increased to 51.5  Tobacco abuse Baseline 1 pack/day smoker Nicotine patch Follow  OSA (obstructive sleep apnea) Recurring issue with patient not having yet obtained outpatient sleep study With benefit from a patient sleep study if possible and discharge with CPAP Follow  Chest pain Per report, patient with left-sided chest pain associated with coughing in the setting of acute respiratory failure with hypoxia Troponin negative x 1. EKG sinus tach D-dimer negative Will otherwise follow for now Reassess at presenting decline from a cardiac or respiratory standpoint Follow  Hypersomnolence Recurring issue in the setting of untreated obstructive sleep apnea Patient is known to have been admitted multiple times for similar issues Consider sleep study in house if available so as to patient receiving CPAP prior to  discharge Follow      Advance Care Planning:   Code Status: Full Code   Consults: none  Family Communication: no family at the bedside   Severity of Illness: The appropriate patient status for this patient is INPATIENT. Inpatient status is judged to be reasonable and necessary in order to provide the required intensity of service to ensure the patient's safety. The patient's presenting symptoms, physical exam findings, and initial radiographic and laboratory data in the context of their chronic comorbidities is felt to place them at high risk for further clinical deterioration. Furthermore, it is not anticipated that the patient will be medically stable for discharge from the hospital within 2 midnights of admission.   * I certify that at the point of admission it is my clinical judgment that the patient will require inpatient hospital care spanning beyond 2 midnights from the point of admission due to high intensity of service, high risk for further deterioration and high frequency of surveillance required.*  Author: Deneise Lever, MD 12/11/2022 10:23 AM  For on call review [email or web address].

## 2022-12-11 NOTE — ED Notes (Signed)
Pt arrived with complaint of sob, chest pain. Pt states his son had covid a few weeks ago. Pt states "it feels like I have bronchitis." Pt complaining of tightness in his chest. Pt reports smoking a cigarette before coming in. Pt o2 saturation was 80% on room air. Pt placed on 3LNC. Pt appears diaphoretic. Pt reports taking aspirin and OTC medication last night with no relief.

## 2022-12-11 NOTE — ED Notes (Signed)
Pt was placed on [place] at 4L due to low 02 sats at 80% on RA

## 2022-12-11 NOTE — ED Notes (Signed)
Pt resting comfortably with bipap on at this time.

## 2022-12-11 NOTE — ED Notes (Signed)
Pt talking with MD Ernestina Patches over the phone. Pt agrees to stay in hospital.

## 2022-12-11 NOTE — ED Notes (Signed)
Pt back on bipap at this time.

## 2022-12-11 NOTE — Assessment & Plan Note (Addendum)
Suspect secondary to possible viral illness/bronchitis with chronic contributions including obesity hypoventilation syndrome/OSA not on CPAP, tobacco abuse, likely subacute versus undiagnosed obstructive lung disease Positive rhinorrhea, nasal congestion for 2 to 3 days per report with noted sick contact and young son with similar symptoms Requiring BiPAP in the ER Noted compensated respiratory acidosis on ABG Chest x-ray within normal limits Positive wheezing on exam Will place on course of IV Solu-Medrol, IV doxycycline, DuoNebs Continue BiPAP for now Expanded respiratory virus panel Was pending outpatient sleep study that has never been done May benefit from formal sleep study while in house to expedite home CPAP Follow

## 2022-12-11 NOTE — ED Notes (Signed)
Pt support person given warm wipes, cleanser, and washcloths to provide foot care to pt per request.

## 2022-12-11 NOTE — ED Notes (Signed)
RN called respiratory to run VBG that was sent

## 2022-12-11 NOTE — Assessment & Plan Note (Signed)
Baseline 1 pack/day smoker Nicotine patch Follow

## 2022-12-11 NOTE — ED Notes (Signed)
Pt given PRN ativan prior to putting bipap back on. Pt O2 saturation continuously dropping on high flow [place]. MD [place] notified. RN called RT to place pt back on bipap.

## 2022-12-11 NOTE — ED Notes (Signed)
RN called respiratory to deliver breathing treatment through bipap.

## 2022-12-11 NOTE — ED Triage Notes (Addendum)
Pt comes with c/o cough, congestion. Pt states son was sick few weeks ago. Pt has had productive phelgm.  Pt states last night he started having some left sided cp and arm pain. Pt states heaviness on chest.  Pt appears sweaty under his eyes. Pt states he has been hot on and off. Pt denies any known fevers. Pt has noticeable labored breathing present.

## 2022-12-11 NOTE — Assessment & Plan Note (Addendum)
Suspect secondary to possible viral illness/bronchitis with chronic contributions including obesity hypoventilation syndrome/OSA not on CPAP, tobacco abuse, likely subacute versus undiagnosed obstructive lung disease Positive rhinorrhea, nasal congestion for 2 to 3 days per report with noted sick contact and young son with similar symptoms.  Respiratory viral panel negative. Requiring BiPAP in the ER-now on heated high flow Noted compensated respiratory acidosis on ABG Chest x-ray within normal limits Covid, flu, RSV negative  -Continue with steroid and doxycycline -Patient will get benefit from NIV for home as he had significant hypercarbia on ABG. -Continue with supplemental oxygen-wean as tolerated -Continue with supportive care

## 2022-12-11 NOTE — ED Provider Notes (Signed)
[facility] Provider Note    Event Date/Time   First MD Initiated Contact with Patient 12/11/22 [phone]     (approximate)   History   Cough and Chest Pain   HPI  Rufe Rutkowski Gadbois is a 37 y.o. male past medical history of hypertension obesity and tobacco use who presents because of cough congestion chest pain.  Patient tells me for about 3 days he has had cough productive of yellow sputum and nasal congestion.  When he was driving just prior to come to ED he started having sharp pain left side of his chest that happened after coughing.  Lasted for about 2 minutes and he felt his left arm which is why he comes to the ED.  Says he has this pain after coughing since last night.  He is not having any pain now and denies pleuritic pain.  His son who is 8 months has had a viral illness.  No other family were had COVID about 3 weeks ago.  Patient denies lower extremity swelling denies hemoptysis or history of DVT/PE.  He denies fevers or chills.  Does smoke tobacco regularly and smokes cigarette just prior to come to the ED.     Past Medical History:  Diagnosis Date   Acute respiratory failure with hypoxia ([place]) 03/06/2020   Bronchitis    Hypertension    Pneumonia    Tobacco abuse     Patient Active Problem List   Diagnosis Date Noted   Obesity, Class III, BMI 40-49.9 (morbid obesity) ([place]) 07/03/2022   Diarrhea 07/03/2022   CAP (community acquired pneumonia) 04/22/2022   Acute bronchitis 11/21/2021   Tobacco abuse 11/21/2021   Chest pain 11/21/2021   Lower respiratory tract infection due to COVID-19 virus 07/25/2021   Hilar lymphadenopathy 07/25/2021   Hypersomnolence 03/15/2020   COVID-19 virus infection 03/06/2020   Acute respiratory failure with hypoxia ([place]) 03/06/2020   Essential hypertension 03/06/2020     Physical Exam  Triage Vital Signs: ED Triage Vitals [12/11/22 0737]  Enc Vitals Group     BP      Pulse      Resp      Temp      Temp src       SpO2      Weight      Height      Head Circumference      Peak Flow      Pain Score 9     Pain Loc      Pain Edu?      Excl. in [place]?     Most recent vital signs: Vitals:   12/11/22 0830 12/11/22 0900  BP: (!) 146/109 (!) 147/102  Pulse: 97 95  Resp: (!) 21 11  Temp:    SpO2: 93% 93%     General: Awake, nontoxic, mildly diaphoretic CV:  Good peripheral perfusion.  Woody edema in bilateral lower extremities Resp:  Mildly tachypneic but able to speak in full sentences breath sounds are diminished throughout Abd:  No distention.  Neuro:             Awake, Alert, Oriented x 3  Other:     ED Results / Procedures / Treatments  Labs (all labs ordered are listed, but only abnormal results are displayed) Labs Reviewed  CBC WITH DIFFERENTIAL/PLATELET - Abnormal; Notable for the following components:      Result Value   RBC 5.85 (*)    HCT 53.5 (*)  MCHC 29.3 (*)    Abs Immature Granulocytes 0.14 (*)    All other components within normal limits  COMPREHENSIVE METABOLIC PANEL - Abnormal; Notable for the following components:   Glucose, Bld 159 (*)    Calcium 8.3 (*)    ALT 45 (*)    All other components within normal limits  BLOOD GAS, VENOUS - Abnormal; Notable for the following components:   pCO2, Ven 75 (*)    pO2, Ven 49 (*)    Bicarbonate 38.6 (*)    Acid-Base Excess 9.4 (*)    All other components within normal limits  RESP PANEL BY RT-PCR (RSV, FLU A&B, COVID)  [id number]  D-DIMER, QUANTITATIVE  BRAIN NATRIURETIC PEPTIDE  PROCALCITONIN  TROPONIN I (HIGH SENSITIVITY)  TROPONIN I (HIGH SENSITIVITY)     EKG  EKG reviewed interpreted myself shows sinus tachycardia normal axis normal intervals, no acute ischemic changes   RADIOLOGY I reviewed and interpreted the CXR which does not show any acute cardiopulmonary process    PROCEDURES:  Critical Care performed: Yes, see critical care procedure note(s)  .1-3 Lead EKG Interpretation  Performed by:  Rada Hay, MD Authorized by: Rada Hay, MD     Interpretation: abnormal     ECG rate assessment: tachycardic     Rhythm: sinus tachycardia     Ectopy: none     Conduction: normal     The patient is on the cardiac monitor to evaluate for evidence of arrhythmia and/or significant heart rate changes.   MEDICATIONS ORDERED IN ED: Medications - No data to display   IMPRESSION / MDM / [place] / ED COURSE  I reviewed the triage vital signs and the nursing notes.                              Patient's presentation is most consistent with acute presentation with potential threat to life or bodily function.  Differential diagnosis includes, but is not limited to, pneumonia, exacerbation of COPD/bronchitis, CHF, pulmonary embolism, pleural effusion, myocarditis/pericarditis  Patient is a 37 year old male with obesity and chronic tobacco use who presents because of cough congestion and chest pain.  He has had about 3 days of productive cough and dyspnea.  Since last night he has had intermittent sharp chest pain with coughing that worsened when he was driving this morning and had some associated pain/sensation of locking up of the left arm.  This is since resolved and he is denying pain currently.  On arrival initial sat is documented at 80% patient placed on 4 L.  When he is on 4 L when I am in the room he is saturating 95%.  He is mildly tachypneic and tachycardic and hypertensive.  He is able to speak in full sentences breath sounds sounds somewhat diminished.  He has some chronic appearing woody edema in the bilateral lower extremities.  Given his symptoms I suspect an infectious process.  Will send CBC CMP Pro-Cal BNP troponin.  Will also send D-dimer given possibility of pulmonary embolism with his obesity tobacco use and hypoxia.  Dissipate admission for hypoxic respiratory failure regardless.  Nursing called me to patient's bedside that he was saturating in the  69s.  This is when he had fallen asleep.  On my evaluation patient is awake he is saturating in the mid 90s on 4 L but when he falls asleep even when he is sitting up he does quickly desaturate  to the 26s.  I asked patient if he has sleep apnea he says he was told he likely does but has not yet had a sleep study.  I do see he has seen pulmonary about 3 months ago and there is moderate suspicion for sleep apnea given excessive daytime sleepiness and his habitus.  Do suspect that this is likely OSA.  Patient is quite tired will send VBG to screen for hypercarbia but he also worked third shift last night so I think this is contributing.  Given he is likely to falsely began in the ED and desaturate will place him on CPAP.  Patient placed on BiPAP with O2 sats.  Continues to fall asleep but is satting okay on the BiPAP 12/5 pulling good tidal volumes.  Patient's chest x-ray looks clear troponin negative D-dimer negative.  CBC and CMP are largely unrevealing.  COVID and flu test are negative.  BNP is negative.  Suspect patient likely has a bronchitis and underlying OSA which is now causing his hypoxic respiratory failure.  VBG does show elevated CO2 but this is likely chronic given normal pH.  Patient will require admission.      FINAL CLINICAL IMPRESSION(S) / ED DIAGNOSES   Final diagnoses:  Acute respiratory failure with hypoxia ([place])     Rx / DC Orders   ED Discharge Orders     None        Note:  This document was prepared using Dragon voice recognition software and may include unintentional dictation errors.   Rada Hay, MD 12/11/22 [phone]

## 2022-12-11 NOTE — ED Notes (Addendum)
Respiratory at bedside to place pt on bipap. Pt o2 saturation dropping to 53% with good waveform while sleeping. MD [employer] notified. Pt o2 saturation comes up to 93-95% when pt is awake. Pt falling asleep during conversation. Pt states he worked over night so he is even more tired.

## 2022-12-11 NOTE — Progress Notes (Signed)
OT Cancellation Note  Patient Details Name: Ryan Maxwell MRN: [id number] DOB: 1985/12/01   Cancelled Treatment:    Reason Eval/Treat Not Completed: Medical issues which prohibited therapy.OT ordered and chart reviewed. Pt's respiratory status has not been optimized yet, difficulty maintaining saturations/tolerating delivery systems. WIll continue to follow, defer evaluation to later date/time.  Darleen Crocker, MS, OTR/L , CBIS ascom [phone]  12/11/22, 1:53 PM

## 2022-12-11 NOTE — ED Notes (Signed)
RN to bedside, pt took bipap off and states he isnt wearing it because it is giving him a headache. Pt o2 saturation on room air remains 85-90%. RN placed pt on 4LNC. Pt o2 saturation at 87% on 4LNC. RN advised patient the importance of bipap. Pt states "I am okay with regular oxygen, that's better than what it was when I came in."

## 2022-12-12 DIAGNOSIS — G4710 Hypersomnia, unspecified: Secondary | ICD-10-CM

## 2022-12-12 DIAGNOSIS — U071 Acute respiratory failure, unspecified whether with hypoxia or hypercapnia: Secondary | ICD-10-CM

## 2022-12-12 DIAGNOSIS — J9601 Acute respiratory failure with hypoxia: Secondary | ICD-10-CM | POA: Diagnosis not present

## 2022-12-12 DIAGNOSIS — Z720 Tobacco use: Secondary | ICD-10-CM | POA: Diagnosis not present

## 2022-12-12 DIAGNOSIS — J9600 Acute respiratory failure, unspecified whether with hypoxia or hypercapnia: Secondary | ICD-10-CM

## 2022-12-12 DIAGNOSIS — J9602 Acute respiratory failure with hypercapnia: Secondary | ICD-10-CM

## 2022-12-12 DIAGNOSIS — I10 Essential (primary) hypertension: Secondary | ICD-10-CM | POA: Diagnosis not present

## 2022-12-12 DIAGNOSIS — J9621 Acute and chronic respiratory failure with hypoxia: Secondary | ICD-10-CM | POA: Diagnosis present

## 2022-12-12 DIAGNOSIS — R079 Chest pain, unspecified: Secondary | ICD-10-CM

## 2022-12-12 HISTORY — DX: Acute respiratory failure, unspecified whether with hypoxia or hypercapnia: J96.00

## 2022-12-12 HISTORY — DX: Acute respiratory failure, unspecified whether with hypoxia or hypercapnia: U07.1

## 2022-12-12 HISTORY — DX: Acute and chronic respiratory failure with hypoxia: J96.21

## 2022-12-12 LAB — RESPIRATORY PANEL BY PCR

## 2022-12-12 LAB — CBC
HCT: 54.2 % — ABNORMAL HIGH (ref 39.0–52.0)
Hemoglobin: 15.8 g/dL (ref 13.0–17.0)
MCH: 27 pg (ref 26.0–34.0)
MCHC: 29.2 g/dL — ABNORMAL LOW (ref 30.0–36.0)
MCV: 92.6 fL (ref 80.0–100.0)
Platelets: 256 10*3/uL (ref 150–400)
RBC: 5.85 MIL/uL — ABNORMAL HIGH (ref 4.22–5.81)
RDW: 14.1 % (ref 11.5–15.5)
WBC: 16.3 10*3/uL — ABNORMAL HIGH (ref 4.0–10.5)
nRBC: 0 % (ref 0.0–0.2)

## 2022-12-12 LAB — COMPREHENSIVE METABOLIC PANEL
ALT: 37 U/L (ref 0–44)
AST: 23 U/L (ref 15–41)
Albumin: 3.5 g/dL (ref 3.5–5.0)
Alkaline Phosphatase: 67 U/L (ref 38–126)
Anion gap: 6 (ref 5–15)
BUN: 16 mg/dL (ref 6–20)
CO2: 32 mmol/L (ref 22–32)
Calcium: 8.7 mg/dL — ABNORMAL LOW (ref 8.9–10.3)
Chloride: 103 mmol/L (ref 98–111)
Creatinine, Ser: 0.88 mg/dL (ref 0.61–1.24)
GFR, Estimated: >60 mL/min (ref >60)
Glucose, Bld: 149 mg/dL — ABNORMAL HIGH (ref 70–99)
Potassium: 5.2 mmol/L — ABNORMAL HIGH (ref 3.5–5.1)
Sodium: 141 mmol/L (ref 135–145)
Total Bilirubin: 0.5 mg/dL (ref 0.3–1.2)
Total Protein: 7.3 g/dL (ref 6.5–8.1)

## 2022-12-12 LAB — MRSA NEXT GEN BY PCR, NASAL: MRSA by PCR Next Gen: NOT DETECTED

## 2022-12-12 LAB — GLUCOSE, CAPILLARY: Glucose-Capillary: 162 mg/dL — ABNORMAL HIGH (ref 70–99)

## 2022-12-12 MED ORDER — PATIROMER SORBITEX CALCIUM 8.4 G PO PACK
16.8000 g | PACK | Freq: Once | ORAL | Status: AC
  Administered 2022-12-12: 16.8 g via ORAL
  Filled 2022-12-12 (×2): qty 2

## 2022-12-12 MED ORDER — DM-GUAIFENESIN ER 30-600 MG PO TB12: 1.0000 | ORAL_TABLET | Freq: Two times a day (BID) | ORAL | Status: DC

## 2022-12-12 MED ORDER — BENZONATATE 100 MG PO CAPS
200.0000 mg | ORAL_CAPSULE | Freq: Three times a day (TID) | ORAL | Status: DC
  Administered 2022-12-12 – 2022-12-18 (×18): 200 mg via ORAL
  Filled 2022-12-12 (×18): qty 2

## 2022-12-12 MED ORDER — LISINOPRIL 20 MG PO TABS
20.0000 mg | ORAL_TABLET | Freq: Every day | ORAL | Status: DC
  Administered 2022-12-12 – 2022-12-16 (×5): 20 mg via ORAL
  Filled 2022-12-12: qty 4
  Filled 2022-12-12: qty 1
  Filled 2022-12-12 (×2): qty 4
  Filled 2022-12-12: qty 1

## 2022-12-12 MED ORDER — CHLORHEXIDINE GLUCONATE CLOTH 2 % EX PADS
6.0000 | MEDICATED_PAD | Freq: Every day | CUTANEOUS | Status: DC
  Administered 2022-12-12 – 2022-12-16 (×5): 6 via TOPICAL

## 2022-12-12 MED ORDER — AMLODIPINE BESYLATE 10 MG PO TABS
10.0000 mg | ORAL_TABLET | Freq: Every day | ORAL | Status: DC
  Administered 2022-12-12 – 2022-12-18 (×7): 10 mg via ORAL
  Filled 2022-12-12 (×7): qty 1

## 2022-12-12 MED ORDER — ORAL CARE MOUTH RINSE: 15.0000 mL | OROMUCOSAL | Status: DC | PRN

## 2022-12-12 NOTE — Assessment & Plan Note (Signed)
Recurring issue with patient not having yet obtained outpatient sleep study With benefit from a patient sleep study if possible and discharge with CPAP Follow

## 2022-12-12 NOTE — Assessment & Plan Note (Signed)
Estimated body mass index is 47.48 kg/m as calculated from the following:   Height as of this encounter: 6' (1.829 m).   Weight as of this encounter: 158.8 kg.   -This will complicate overall prognosis. -High risk for hypoventilation syndrome

## 2022-12-12 NOTE — Evaluation (Signed)
Occupational Therapy Evaluation Patient Details Name: Ryan Maxwell MRN: [id number] DOB: [birth year] Today's Date: 12/12/2022   History of Present Illness 37 y.o. male with medical history significant of obesity, tobacco abuse, hypertension, OSA pending sleep study presenting with acute respiratory failure with hypoxia.  Limited history as the patient is sleeping on BiPAP.  Per report, patient with approximately 3 days of rhinorrhea, nasal congestion, cough.  Also with some left-sided chest pain associated with coughing.  Positive sick contact and is a mouth also currently with a viral illness.  Patient noted had multiple admissions for similar issues over the past 3 years.  Many of these admissions requiring BiPAP.   Clinical Impression   Upon entering the room, pt supine in bed and currently on HFNC 35Ls of O2. Pt reports living at home with significant other, mother, and 2 children ( 71 month old and 30 y/o). He is Ind in all aspects of care and works full time as a Programmer, systems. Pt demonstrates bed mobility and functional mobility in room (limited by O2 requirements) without any physical assistance. OT did assist pt with B socks but pt reports needing this assistance at baseline. O2 remained at or above 93% during session with functional tasks. No skilled need for acute OT intervention and pt agrees. OT to complete orders at this time.      Recommendations for follow up therapy are one component of a multi-disciplinary discharge planning process, led by the attending physician.  Recommendations may be updated based on patient status, additional functional criteria and insurance authorization.   Follow Up Recommendations  No OT follow up     Assistance Recommended at Discharge None     Functional Status Assessment  Patient has not had a recent decline in their functional status  Equipment Recommendations  None recommended by OT       Precautions / Restrictions  Precautions Precautions: Fall      Mobility Bed Mobility Overal bed mobility: Independent                  Transfers Overall transfer level: Independent Equipment used: None                      Balance Overall balance assessment: Independent                                         ADL either performed or assessed with clinical judgement   ADL Overall ADL's : At baseline                                       General ADL Comments: assist to don B socks( significant other assists at baseline)     Vision Patient Visual Report: No change from baseline              Pertinent Vitals/Pain Pain Assessment Pain Assessment: No/denies pain        Extremity/Trunk Assessment Upper Extremity Assessment Upper Extremity Assessment: Overall WFL for tasks assessed   Lower Extremity Assessment Lower Extremity Assessment: Overall WFL for tasks assessed       Communication Communication Communication: No difficulties   Cognition Arousal/Alertness: Awake/alert Behavior During Therapy: WFL for tasks assessed/performed Overall Cognitive Status: Within Functional Limits for tasks assessed  Home Living Family/patient expects to be discharged to:: Private residence Living Arrangements: Spouse/significant other;Children;Parent (lives with mother, partner, 16 y/o and 32 month old) Available Help at Discharge: Family;Available PRN/intermittently Type of Home: Apartment Home Access: Stairs to enter Entrance Stairs-Number of Steps: 2-3   Home Layout: Two level;Bed/bath upstairs Alternate Level Stairs-Number of Steps: flight   Bathroom Shower/Tub: Tub/shower unit         Home Equipment: None          Prior Functioning/Environment Prior Level of Function : Independent/Modified Independent;Driving;Working/employed               ADLs Comments: Pt Ind in  all aspects of care and works full time as Programmer, systems.                 OT Goals(Current goals can be found in the care plan section) Acute Rehab OT Goals Patient Stated Goal: to go home OT Goal Formulation: With patient Time For Goal Achievement: 12/12/22 Potential to Achieve Goals: Good  OT Frequency:         AM-PAC OT "6 Clicks" Daily Activity     Outcome Measure Help from another person eating meals?: None Help from another person taking care of personal grooming?: None Help from another person toileting, which includes using toliet, bedpan, or urinal?: None Help from another person bathing (including washing, rinsing, drying)?: None Help from another person to put on and taking off regular upper body clothing?: None Help from another person to put on and taking off regular lower body clothing?: None 6 Click Score: 24   End of Session Nurse Communication: Mobility status  Activity Tolerance: Patient tolerated treatment well Patient left: in bed;with call bell/phone within reach                   Time: 1132-1156 OT Time Calculation (min): 24 min Charges:  OT General Charges $OT Visit: 1 Visit OT Evaluation $OT Eval Low Complexity: 1 Low OT Treatments $Therapeutic Activity: 8-22 mins Darleen Crocker, MS, OTR/L , CBIS ascom [phone]  12/12/22, 1:04 PM

## 2022-12-12 NOTE — Assessment & Plan Note (Signed)
Baseline 1 pack/day smoker Nicotine patch Counseling was provided

## 2022-12-12 NOTE — ED Notes (Signed)
Pt resting in bed eyes closed normal rise and fall of chest noted.  BIPAP in place.  Will continue plan of care

## 2022-12-12 NOTE — Assessment & Plan Note (Signed)
Recurring issue in the setting of untreated obstructive sleep apnea Patient is known to have been admitted multiple times for similar issues Consider sleep study in house if available so as to patient receiving CPAP prior to discharge Will ask TOC to see if he can get him BiPAP for home

## 2022-12-12 NOTE — Assessment & Plan Note (Signed)
Blood pressure elevated.  Patient was not taking his home antihypertensives. -Restarting home lisinopril and amlodipine -Continue to monitor

## 2022-12-12 NOTE — Progress Notes (Signed)
PT Cancellation Note  Patient Details Name: Ryan Maxwell MRN: [id number] DOB: [birth year]   Cancelled Treatment:    Reason Eval/Treat Not Completed: PT screened, no needs identified, will sign off. Recently evaluated by OT who reports pt has no acute changes to gross mobility either in safety or independence. Chief Strategy Officer met with patient at bedside, pt denies any acute skilled PT needs at this time. Author encouraged pt to ask for consult in future should be become aware of any acute changes in strength, balance, mobility, etc. PT signing off at this time. Thank you for this consult.   12:56 PM, 12/12/22 Etta Grandchild, PT, DPT Physical Therapist - [facility]  [phone] ([place])     [place] 12/12/2022, 12:55 PM

## 2022-12-12 NOTE — Hospital Course (Addendum)
Taken from H&P.  Ryan Maxwell is a 37 y.o. male with medical history significant of obesity, tobacco abuse, hypertension, OSA pending sleep study presenting with acute respiratory failure with hypoxia.   Patient noted had multiple admissions for similar issues over the past 3 years.  Many of these admissions requiring BiPAP.  There has been some concern for obstructive sleep apnea in the setting of morbid obesity.  Patient has yet to have had a sleep study done.  Per report,  patient still smoking 1 pack/day.  No reported fevers or chills.  On presentation patient was afebrile, saturating in 80s on room air, initially placed on 4 L and later transition to BiPAP when ABG shows significant hypercarbia and hypoxia. COVID, flu and RSV negative. Troponin negative x 1. EKG was sinus tachycardia.  Chest x-ray grossly within normal limits.   3/6: Blood pressure elevated 175/95 this morning.  Labs with mild hyperkalemia at 5.2-giving Veltassa.  Troponin negative.  BNP normal, procalcitonin negative.  D-dimer normal. CBC with worsening leukocytosis which can be secondary to steroid use. Patient was able to wean off from BiPAP to heated high flow.  Multiple antihypertensives listed in his chart but he was not taking them apparently.  Start him on lisinopril and amlodipine. Patient with morbid obesity and will get benefit from NIV at home. We will ask TOC to help.  3/7: Vital stable, remained on heated high flow at 35 L and FiO2 of 30%.  We will try weaning.  Also send a message to [facility] to look for NIV for discharge.  acute on chronic respiratory failure second to probable COPD, needs non-invasive ventilator to reduce recurrent hospitalizations as a result of exacerbations. CPAP and bi-level PAP are ruled out as viable treatment due to pts deconditioning"   3/8: Seems improving, currently on 3 L of oxygen.  ABG this morning with compensated respiratory acidosis with CO2 of 65.  Rest of the labs stable.   TOC is trying to get him NIV to prevent further hospitalization.  Home oxygen was also ordered.  Awaiting insurance authorization before discharge.  Patient with significant desaturation when he was taken off from BiPAP at night.  3/9: Patient continued to improve.  Able to wean back to room air while resting.  Still awaiting insurance authorization for NIV as continue to have significant desaturation while sleeping along with retention of carbon dioxide.  3/10: Patient remained stable.  Able to wean back to room air while ambulating but continued to require BiPAP with significant desaturation while sleeping.  Awaiting insurance authorization for NIV to go home.  3/11: Patient remained stable and eager to go home.  Remained on room air but continued to desaturate requiring BiPAP at night.  Blood pressure has been improved on current regimen which include amlodipine, lisinopril and HCTZ.  We restarted home amlodipine and HCTZ and increase the dose of home lisinopril.  Patient need continuation of encouragement and counseling to lose weight.  Patient need to have a close follow-up with primary care provider for further management.

## 2022-12-12 NOTE — Progress Notes (Signed)
Called ED for Report- Nurse Dareen Piano) states will call me back when she has a chance.

## 2022-12-12 NOTE — Assessment & Plan Note (Signed)
Resolved.  Troponin and D-dimer negative. EKG without any acute changes. -Continue to monitor

## 2022-12-12 NOTE — Progress Notes (Signed)
Progress Note   Patient: Ryan Maxwell [id number] DOB: [birth year] DOA: 12/11/2022     1 DOS: the patient was seen and examined on 12/12/2022   Brief hospital course: Taken from H&P.  Ryan Maxwell is a 37 y.o. male with medical history significant of obesity, tobacco abuse, hypertension, OSA pending sleep study presenting with acute respiratory failure with hypoxia.   Patient noted had multiple admissions for similar issues over the past 3 years.  Many of these admissions requiring BiPAP.  There has been some concern for obstructive sleep apnea in the setting of morbid obesity.  Patient has yet to have had a sleep study done.  Per report,  patient still smoking 1 pack/day.  No reported fevers or chills.  On presentation patient was afebrile, saturating in 80s on room air, initially placed on 4 L and later transition to BiPAP when ABG shows significant hypercarbia and hypoxia. COVID, flu and RSV negative. Troponin negative x 1. EKG was sinus tachycardia.  Chest x-ray grossly within normal limits.   3/6: Blood pressure elevated 175/95 this morning.  Labs with mild hyperkalemia at 5.2-giving Veltassa.  Troponin negative.  BNP normal, procalcitonin negative.  D-dimer normal. CBC with worsening leukocytosis which can be secondary to steroid use. Patient was able to wean off from BiPAP to heated high flow.  Multiple antihypertensives listed in his chart but he was not taking them apparently.  Start him on lisinopril and amlodipine. Patient with morbid obesity and will get benefit from NIV at home. We will ask TOC to help.     Assessment and Plan: * Acute respiratory failure with hypoxia and hypercapnia (HCC) Suspect secondary to possible viral illness/bronchitis with chronic contributions including obesity hypoventilation syndrome/OSA not on CPAP, tobacco abuse, likely subacute versus undiagnosed obstructive lung disease Positive rhinorrhea, nasal congestion for 2 to 3 days per report  with noted sick contact and young son with similar symptoms.  Respiratory viral panel negative. Requiring BiPAP in the ER-now on heated high flow Noted compensated respiratory acidosis on ABG Chest x-ray within normal limits Covid, flu, RSV negative  -Continue with steroid and doxycycline -Patient will get benefit from NIV for home as he had significant hypercarbia on ABG. -Continue with supplemental oxygen-wean as tolerated -Continue with supportive care  Essential hypertension Blood pressure elevated.  Patient was not taking his home antihypertensives. -Restarting home lisinopril and amlodipine -Continue to monitor  Obesity, Class III, BMI 40-49.9 (morbid obesity) ([place]) Estimated body mass index is 47.48 kg/m as calculated from the following:   Height as of this encounter: 6' (1.829 m).   Weight as of this encounter: 158.8 kg.   -This will complicate overall prognosis. -High risk for hypoventilation syndrome  Tobacco abuse Baseline 1 pack/day smoker Nicotine patch Counseling was provided  Hypersomnolence Recurring issue in the setting of untreated obstructive sleep apnea Patient is known to have been admitted multiple times for similar issues Consider sleep study in house if available so as to patient receiving CPAP prior to discharge Will ask TOC to see if he can get him BiPAP for home  Chest pain Resolved.  Troponin and D-dimer negative. EKG without any acute changes. -Continue to monitor  OSA (obstructive sleep apnea) Recurring issue with patient not having yet obtained outpatient sleep study With benefit from a patient sleep study if possible and discharge with CPAP Follow   Subjective: Patient was feeling improved when seen today.  He was on heated high flow.  Does not uses oxygen at baseline.  Does not feel fresh when he get up in the morning at home.  Having cough which is more than his baseline.  Physical Exam: Vitals:   12/12/22 1000 12/12/22 1100 12/12/22  1200 12/12/22 1300  BP: (!) 139/94 (!) 147/92 139/86 (!) 147/97  Pulse: 92 93 (!) 40 88  Resp: (!) 24 (!) 22 (!) 24 (!) 25  Temp:      TempSrc:      SpO2: (!) 81% (!) 87% (!) 71% 96%  Weight:      Height:       General.  Morbidly obese gentleman, in no acute distress. Pulmonary.  Lungs clear bilaterally, normal respiratory effort. CV.  Regular rate and rhythm, no JVD, rub or murmur. Abdomen.  Soft, nontender, nondistended, BS positive. CNS.  Alert and oriented .  No focal neurologic deficit. Extremities.  No edema, no cyanosis, pulses intact and symmetrical. Psychiatry.  Judgment and insight appears normal.   Data Reviewed: Prior data reviewed  Family Communication: Discussed with patient  Disposition: Status is: Inpatient Remains inpatient appropriate because: Severity of illness  Planned Discharge Destination: Home  DVT prophylaxis.  Lovenox Time spent: 45 minutes  This record has been created using Systems analyst. Errors have been sought and corrected,but may not always be located. Such creation errors do not reflect on the standard of care.   Author: Lorella Nimrod, MD 12/12/2022 2:20 PM  For on call review [email or web address].

## 2022-12-13 DIAGNOSIS — Z720 Tobacco use: Secondary | ICD-10-CM | POA: Diagnosis not present

## 2022-12-13 DIAGNOSIS — J9601 Acute respiratory failure with hypoxia: Secondary | ICD-10-CM | POA: Diagnosis not present

## 2022-12-13 DIAGNOSIS — I10 Essential (primary) hypertension: Secondary | ICD-10-CM | POA: Diagnosis not present

## 2022-12-13 LAB — HEMOGLOBIN A1C
Hgb A1c MFr Bld: 6.5 % — ABNORMAL HIGH (ref 4.8–5.6)
Mean Plasma Glucose: 140 mg/dL

## 2022-12-13 LAB — GLUCOSE, CAPILLARY
Glucose-Capillary: 269 mg/dL — ABNORMAL HIGH (ref 70–99)
Glucose-Capillary: 253 mg/dL — ABNORMAL HIGH (ref 70–99)

## 2022-12-13 MED ORDER — IPRATROPIUM-ALBUTEROL 0.5-2.5 (3) MG/3ML IN SOLN
3.0000 mL | Freq: Four times a day (QID) | RESPIRATORY_TRACT | Status: DC
  Administered 2022-12-13 – 2022-12-14 (×4): 3 mL via RESPIRATORY_TRACT
  Filled 2022-12-13 (×4): qty 3

## 2022-12-13 MED ORDER — ADULT MULTIVITAMIN W/MINERALS CH
1.0000 | ORAL_TABLET | Freq: Every day | ORAL | Status: DC
  Administered 2022-12-14 – 2022-12-17 (×4): 1 via ORAL
  Filled 2022-12-13 (×5): qty 1

## 2022-12-13 MED ORDER — ADULT MULTIVITAMIN W/MINERALS CH: 1.0000 | ORAL_TABLET | Freq: Every day | ORAL | Status: DC

## 2022-12-13 MED ORDER — INSULIN ASPART 100 UNIT/ML IJ SOLN
0.0000 [IU] | Freq: Every day | INTRAMUSCULAR | Status: DC
  Administered 2022-12-13 – 2022-12-17 (×3): 3 [IU] via SUBCUTANEOUS
  Filled 2022-12-13 (×4): qty 1

## 2022-12-13 MED ORDER — ENSURE MAX PROTEIN PO LIQD
11.0000 [oz_av] | Freq: Two times a day (BID) | ORAL | Status: DC
  Administered 2022-12-13 – 2022-12-17 (×8): 11 [oz_av] via ORAL

## 2022-12-13 MED ORDER — DOXYCYCLINE HYCLATE 100 MG PO TABS
100.0000 mg | ORAL_TABLET | Freq: Two times a day (BID) | ORAL | Status: DC
  Administered 2022-12-13 – 2022-12-18 (×10): 100 mg via ORAL
  Filled 2022-12-13 (×10): qty 1

## 2022-12-13 MED ORDER — IPRATROPIUM-ALBUTEROL 0.5-2.5 (3) MG/3ML IN SOLN
3.0000 mL | RESPIRATORY_TRACT | Status: DC
  Administered 2022-12-13: 3 mL via RESPIRATORY_TRACT
  Filled 2022-12-13: qty 3

## 2022-12-13 MED ORDER — INSULIN ASPART 100 UNIT/ML IJ SOLN
0.0000 [IU] | Freq: Three times a day (TID) | INTRAMUSCULAR | Status: DC
  Administered 2022-12-14: 5 [IU] via SUBCUTANEOUS
  Administered 2022-12-15: 2 [IU] via SUBCUTANEOUS
  Administered 2022-12-15: 3 [IU] via SUBCUTANEOUS
  Administered 2022-12-16 – 2022-12-17 (×2): 2 [IU] via SUBCUTANEOUS
  Administered 2022-12-17: 3 [IU] via SUBCUTANEOUS
  Administered 2022-12-18: 2 [IU] via SUBCUTANEOUS
  Filled 2022-12-13 (×7): qty 1

## 2022-12-13 NOTE — Progress Notes (Signed)
Initial Nutrition Assessment  DOCUMENTATION CODES:   Morbid obesity  INTERVENTION:   Ensure Max protein supplement BID, each supplement provides 150kcal and 30g of protein.  MVI po daily   Double protein with meals  Daily weights  NUTRITION DIAGNOSIS:   Increased nutrient needs related to chronic illness (COPD) as evidenced by estimated needs.  GOAL:   Patient will meet greater than or equal to 90% of their needs  MONITOR:   PO intake, Supplement acceptance, Labs, Weight trends, I & O's, Skin  REASON FOR ASSESSMENT:   Consult Assessment of nutrition requirement/status  ASSESSMENT:   37 y/o male with h/o HTN, OSA, recent COVID 19 and marijuana use who is admitted with acute respiratory failure with hypoxia.  Met with pt in room today. Pt reports that he is feeling much better. Pt reports good appetite and oral intake pta and in hospital. Pt reports that he is hungry today; pt documented to be eating 100% of meals. RD will add supplements and MVI to help pt meet his estimated needs. RD will also add double protein with lunch and dinner. Per chart, pt appears weight stable at baseline.   Medications reviewed and include: doxycycline, lovenox, MVI, nicotine, prednisone   Labs reviewed: K 5.2(H) Wbc- 16.3(H)  NUTRITION - FOCUSED PHYSICAL EXAM:  Flowsheet Row Most Recent Value  Orbital Region No depletion  Upper Arm Region No depletion  Thoracic and Lumbar Region No depletion  Buccal Region No depletion  Temple Region No depletion  Clavicle Bone Region No depletion  Clavicle and Acromion Bone Region No depletion  Scapular Bone Region No depletion  Dorsal Hand No depletion  Patellar Region No depletion  Anterior Thigh Region No depletion  Posterior Calf Region No depletion  Edema (RD Assessment) None  Hair Reviewed  Eyes Reviewed  Mouth Reviewed  Skin Reviewed  Nails Reviewed   Diet Order:   Diet Order             Diet Heart Room service appropriate?  Yes; Fluid consistency: Thin  Diet effective now                  EDUCATION NEEDS:   Education needs have been addressed  Skin:  Skin Assessment: Reviewed RN Assessment  Last BM:  PTA  Height:   Ht Readings from Last 1 Encounters:  12/11/22 6' (1.829 m)    Weight:   Wt Readings from Last 1 Encounters:  12/12/22 (!) 158.8 kg    Ideal Body Weight:  80.9 kg  BMI:  Body mass index is 47.48 kg/m.  Estimated Nutritional Needs:   Kcal:  2700-3000kcal/day  Protein:  >135g/day  Fluid:  2.0L/day  Koleen Distance MS, RD, LDN Please refer to [facility] for RD and/or RD on-call/weekend/after hours pager

## 2022-12-13 NOTE — Assessment & Plan Note (Addendum)
Blood pressure remained mildly elevated.  Patient was not taking his home antihypertensives. -Continue home amlodipine -Increase the dose of lisinopril to 40 -Continue to monitor

## 2022-12-13 NOTE — Progress Notes (Signed)
PHARMACIST - PHYSICIAN COMMUNICATION   CONCERNING: IV to Oral Route Change Policy  This patient is receiving the antibiotic(s) doxycycline by the intravenous route. Based on criteria approved by the Pharmacy and Therapeutics Committee, and the Infectious Disease Division, the antibiotic(s) is / are being converted to equivalent oral dose form(s).   These criteria include:  Patient being treated for a respiratory tract infection, urinary tract infection, cellulitis, or Clostridium Difficile Associated Diarrhea  The patient is not neutropenic and does not exhibit a GI malabsorption state  The patient is eating (either orally or per tube) and/or has been taking other orally administered medications for at least 24 hours.  The patient is improving clinically (physician assessment and a 24-hour Tmax of 100.5 F).   If you have questions about this conversion, please contact the pharmacy department.   Thank you.  Will M. Ouida Sills, PharmD PGY-1 Pharmacy Resident 12/13/2022 2:10 PM

## 2022-12-13 NOTE — Progress Notes (Addendum)
Progress Note   Patient: Ryan Maxwell [id number] DOB: [birth year] DOA: 12/11/2022     2 DOS: the patient was seen and examined on 12/13/2022   Brief hospital course: Taken from H&P.  Alec Karow Downs is a 37 y.o. male with medical history significant of obesity, tobacco abuse, hypertension, OSA pending sleep study presenting with acute respiratory failure with hypoxia.   Patient noted had multiple admissions for similar issues over the past 3 years.  Many of these admissions requiring BiPAP.  There has been some concern for obstructive sleep apnea in the setting of morbid obesity.  Patient has yet to have had a sleep study done.  Per report,  patient still smoking 1 pack/day.  No reported fevers or chills.  On presentation patient was afebrile, saturating in 80s on room air, initially placed on 4 L and later transition to BiPAP when ABG shows significant hypercarbia and hypoxia. COVID, flu and RSV negative. Troponin negative x 1. EKG was sinus tachycardia.  Chest x-ray grossly within normal limits.   3/6: Blood pressure elevated 175/95 this morning.  Labs with mild hyperkalemia at 5.2-giving Veltassa.  Troponin negative.  BNP normal, procalcitonin negative.  D-dimer normal. CBC with worsening leukocytosis which can be secondary to steroid use. Patient was able to wean off from BiPAP to heated high flow.  Multiple antihypertensives listed in his chart but he was not taking them apparently.  Start him on lisinopril and amlodipine. Patient with morbid obesity and will get benefit from NIV at home. We will ask TOC to help.  3/7: Vital stable, remained on heated high flow at 35 L and FiO2 of 30%.  We will try weaning.  Also send a message to [facility] to look for NIV for discharge.  acute on chronic respiratory failure second to probable COPD, needs non-invasive ventilator to reduce recurrent hospitalizations as a result of exacerbations. CPAP and bi-level PAP are ruled out as viable treatment due  to pts deconditioning"    Assessment and Plan: * Acute respiratory failure with hypoxia and hypercapnia (HCC) Suspect secondary to possible viral illness/bronchitis with chronic contributions including obesity hypoventilation syndrome/OSA not on CPAP, tobacco abuse, likely subacute versus undiagnosed obstructive lung disease Positive rhinorrhea, nasal congestion for 2 to 3 days per report with noted sick contact and young son with similar symptoms.  Respiratory viral panel negative. Requiring BiPAP in the ER-now on heated high flow Noted compensated respiratory acidosis on ABG Chest x-ray within normal limits Covid, flu, RSV negative  -Continue with steroid and doxycycline -Patient will get benefit from NIV for home as he had significant hypercarbia on ABG. -Continue with supplemental oxygen-wean as tolerated -Continue with supportive care  Essential hypertension Blood pressure improving.  Patient was not taking his home antihypertensives. -Restarting home lisinopril and amlodipine -Continue to monitor  Obesity, Class III, BMI 40-49.9 (morbid obesity) ([place]) Estimated body mass index is 47.48 kg/m as calculated from the following:   Height as of this encounter: 6' (1.829 m).   Weight as of this encounter: 158.8 kg.   -This will complicate overall prognosis. -High risk for hypoventilation syndrome  Tobacco abuse Baseline 1 pack/day smoker Nicotine patch Counseling was provided  Hypersomnolence Recurring issue in the setting of untreated obstructive sleep apnea Patient is known to have been admitted multiple times for similar issues Consider sleep study in house if available so as to patient receiving CPAP prior to discharge Will ask TOC to see if he can get him BiPAP for home  Chest  pain Resolved.  Troponin and D-dimer negative. EKG without any acute changes. -Continue to monitor  OSA (obstructive sleep apnea) Recurring issue with patient not having yet obtained  outpatient sleep study With benefit from a patient sleep study if possible and discharge with CPAP TOC is looking for options   Subjective: Patient is feeling little improved when seen today.  He wants to go home but remained on heated high flow.  Physical Exam: Vitals:   12/13/22 1300 12/13/22 1450 12/13/22 1500 12/13/22 1558  BP: 125/77  133/82   Pulse: 81 84 94 96  Resp: 20 17 (!) 25 (!) 22  Temp:      TempSrc:      SpO2: 91% 95% 93% 93%  Weight:      Height:       General.  Morbidly obese gentleman, in no acute distress. Pulmonary.  Lungs clear bilaterally, normal respiratory effort.  Slightly decreased air entry-may be due to morbid obesity CV.  Regular rate and rhythm, no JVD, rub or murmur. Abdomen.  Soft, nontender, nondistended, BS positive. CNS.  Alert and oriented .  No focal neurologic deficit. Extremities.  No edema, no cyanosis, pulses intact and symmetrical. Psychiatry.  Judgment and insight appears normal.    Data Reviewed: Prior data reviewed  Family Communication: Discussed with patient  Disposition: Status is: Inpatient Remains inpatient appropriate because: Severity of illness  Planned Discharge Destination: Home  DVT prophylaxis.  Lovenox Time spent: 44 minutes  This record has been created using Systems analyst. Errors have been sought and corrected,but may not always be located. Such creation errors do not reflect on the standard of care.   Author: Lorella Nimrod, MD 12/13/2022 4:14 PM  For on call review [email or web address].

## 2022-12-13 NOTE — Assessment & Plan Note (Addendum)
Suspect secondary to possible viral illness/bronchitis with chronic contributions including obesity hypoventilation syndrome/OSA not on CPAP, tobacco abuse, likely subacute versus undiagnosed obstructive lung disease. Respiratory viral panel negative. Chest x-ray within normal limits Weaned back to room air but continued to require BiPAP to sleep due to significant hypercarbia.  Completed the course of steroid and doxycycline. -Awaiting NIV insurance approval. -Continue with supportive care

## 2022-12-13 NOTE — Assessment & Plan Note (Signed)
Recurring issue with patient not having yet obtained outpatient sleep study With benefit from a patient sleep study if possible and discharge with CPAP TOC is looking for options

## 2022-12-13 NOTE — Progress Notes (Signed)
Sent a secure chat message regarding patients blood sugar of [phone] To Dr. Reesa Chew.

## 2022-12-14 DIAGNOSIS — J9601 Acute respiratory failure with hypoxia: Secondary | ICD-10-CM | POA: Diagnosis not present

## 2022-12-14 DIAGNOSIS — Z720 Tobacco use: Secondary | ICD-10-CM | POA: Diagnosis not present

## 2022-12-14 DIAGNOSIS — I10 Essential (primary) hypertension: Secondary | ICD-10-CM | POA: Diagnosis not present

## 2022-12-14 LAB — CBC
HCT: 54.7 % — ABNORMAL HIGH (ref 39.0–52.0)
Hemoglobin: 15.7 g/dL (ref 13.0–17.0)
MCH: 26.6 pg (ref 26.0–34.0)
MCHC: 28.7 g/dL — ABNORMAL LOW (ref 30.0–36.0)
MCV: 92.6 fL (ref 80.0–100.0)
Platelets: 234 10*3/uL (ref 150–400)
RBC: 5.91 MIL/uL — ABNORMAL HIGH (ref 4.22–5.81)
RDW: 14.2 % (ref 11.5–15.5)
WBC: 12.1 10*3/uL — ABNORMAL HIGH (ref 4.0–10.5)
nRBC: 0 % (ref 0.0–0.2)

## 2022-12-14 LAB — BASIC METABOLIC PANEL
Anion gap: 7 (ref 5–15)
BUN: 16 mg/dL (ref 6–20)
CO2: 32 mmol/L (ref 22–32)
Calcium: 8.6 mg/dL — ABNORMAL LOW (ref 8.9–10.3)
Chloride: 100 mmol/L (ref 98–111)
Creatinine, Ser: 0.84 mg/dL (ref 0.61–1.24)
GFR, Estimated: >60 mL/min (ref >60)
Glucose, Bld: 99 mg/dL (ref 70–99)
Potassium: 3.8 mmol/L (ref 3.5–5.1)
Sodium: 139 mmol/L (ref 135–145)

## 2022-12-14 LAB — BLOOD GAS, ARTERIAL
Acid-Base Excess: 9.2 mmol/L — ABNORMAL HIGH (ref 0.0–2.0)
Bicarbonate: 37.6 mmol/L — ABNORMAL HIGH (ref 20.0–28.0)
O2 Content: 3 L/min
O2 Saturation: 98.5 %
Patient temperature: 37
pCO2 arterial: 65 mmHg — ABNORMAL HIGH (ref 32–48)
pH, Arterial: 7.37 (ref 7.35–7.45)
pO2, Arterial: 91 mmHg (ref 83–108)

## 2022-12-14 LAB — GLUCOSE, CAPILLARY
Glucose-Capillary: 83 mg/dL (ref 70–99)
Glucose-Capillary: 117 mg/dL — ABNORMAL HIGH (ref 70–99)
Glucose-Capillary: 203 mg/dL — ABNORMAL HIGH (ref 70–99)
Glucose-Capillary: 181 mg/dL — ABNORMAL HIGH (ref 70–99)

## 2022-12-14 MED ORDER — IPRATROPIUM-ALBUTEROL 0.5-2.5 (3) MG/3ML IN SOLN
3.0000 mL | Freq: Three times a day (TID) | RESPIRATORY_TRACT | Status: DC
  Administered 2022-12-14 – 2022-12-15 (×2): 3 mL via RESPIRATORY_TRACT
  Filled 2022-12-14 (×2): qty 3

## 2022-12-14 NOTE — Progress Notes (Signed)
Progress Note   Patient: Ryan Maxwell [id number] DOB: [birth year] DOA: 12/11/2022     3 DOS: the patient was seen and examined on 12/14/2022   Brief hospital course: Taken from H&P.  Cortrell Angelo Crossley is a 37 y.o. male with medical history significant of obesity, tobacco abuse, hypertension, OSA pending sleep study presenting with acute respiratory failure with hypoxia.   Patient noted had multiple admissions for similar issues over the past 3 years.  Many of these admissions requiring BiPAP.  There has been some concern for obstructive sleep apnea in the setting of morbid obesity.  Patient has yet to have had a sleep study done.  Per report,  patient still smoking 1 pack/day.  No reported fevers or chills.  On presentation patient was afebrile, saturating in 80s on room air, initially placed on 4 L and later transition to BiPAP when ABG shows significant hypercarbia and hypoxia. COVID, flu and RSV negative. Troponin negative x 1. EKG was sinus tachycardia.  Chest x-ray grossly within normal limits.   3/6: Blood pressure elevated 175/95 this morning.  Labs with mild hyperkalemia at 5.2-giving Veltassa.  Troponin negative.  BNP normal, procalcitonin negative.  D-dimer normal. CBC with worsening leukocytosis which can be secondary to steroid use. Patient was able to wean off from BiPAP to heated high flow.  Multiple antihypertensives listed in his chart but he was not taking them apparently.  Start him on lisinopril and amlodipine. Patient with morbid obesity and will get benefit from NIV at home. We will ask TOC to help.  3/7: Vital stable, remained on heated high flow at 35 L and FiO2 of 30%.  We will try weaning.  Also send a message to [facility] to look for NIV for discharge.  acute on chronic respiratory failure second to probable COPD, needs non-invasive ventilator to reduce recurrent hospitalizations as a result of exacerbations. CPAP and bi-level PAP are ruled out as viable treatment due  to pts deconditioning"   3/8: Seems improving, currently on 3 L of oxygen.  ABG this morning with compensated respiratory acidosis with CO2 of 65.  Rest of the labs stable.  TOC is trying to get him NIV to prevent further hospitalization.  Home oxygen was also ordered.  Awaiting insurance authorization before discharge.  Patient with significant desaturation when he was taken off from BiPAP at night.   Assessment and Plan: * Acute respiratory failure with hypoxia and hypercapnia (HCC) Suspect secondary to possible viral illness/bronchitis with chronic contributions including obesity hypoventilation syndrome/OSA not on CPAP, tobacco abuse, likely subacute versus undiagnosed obstructive lung disease Positive rhinorrhea, nasal congestion for 2 to 3 days per report with noted sick contact and young son with similar symptoms.  Respiratory viral panel negative. Requiring BiPAP in the ER-now on heated high flow Noted compensated respiratory acidosis on ABG Chest x-ray within normal limits Covid, flu, RSV negative  -Continue with steroid and doxycycline-will complete a 5-day course -Patient will get benefit from NIV for home as he had significant hypercarbia on ABG. -Continue with supplemental oxygen-wean as tolerated -Continue with supportive care  Essential hypertension Blood pressure improving.  Patient was not taking his home antihypertensives. -Restarting home lisinopril and amlodipine -Continue to monitor  Obesity, Class III, BMI 40-49.9 (morbid obesity) ([place]) Estimated body mass index is 47.48 kg/m as calculated from the following:   Height as of this encounter: 6' (1.829 m).   Weight as of this encounter: 158.8 kg.   -This will complicate overall prognosis. -High risk  for hypoventilation syndrome  Tobacco abuse Baseline 1 pack/day smoker Nicotine patch Counseling was provided  Hypersomnolence Recurring issue in the setting of untreated obstructive sleep apnea Patient is known  to have been admitted multiple times for similar issues Consider sleep study in house if available so as to patient receiving CPAP prior to discharge Will ask TOC to see if he can get him BiPAP for home  Chest pain Resolved.  Troponin and D-dimer negative. EKG without any acute changes. -Continue to monitor  OSA (obstructive sleep apnea) Recurring issue with patient not having yet obtained outpatient sleep study With benefit from a patient sleep study if possible and discharge with CPAP TOC is looking for options   Subjective: Patient was seen and examined today.  No new complaints.  He wants to go home but understand that he need oxygen and BiPAP at night and awaiting insurance authorization.  Physical Exam: Vitals:   12/14/22 1043 12/14/22 1100 12/14/22 1316 12/14/22 1448  BP:   (!) 166/112   Pulse: 92  95   Resp: 16  20   Temp:   (!) 97.4 F (36.3 C)   TempSrc:      SpO2: 92% 94% 95% 99%  Weight:      Height:       General.  Morbidly obese gentleman, in no acute distress. Pulmonary.  Lungs clear bilaterally, normal respiratory effort. CV.  Regular rate and rhythm, no JVD, rub or murmur. Abdomen.  Soft, nontender, nondistended, BS positive. CNS.  Alert and oriented .  No focal neurologic deficit. Extremities.  No edema, no cyanosis, pulses intact and symmetrical. Psychiatry.  Judgment and insight appears normal.     Data Reviewed: Prior data reviewed  Family Communication: Discussed with patient  Disposition: Status is: Inpatient Remains inpatient appropriate because: Severity of illness  Planned Discharge Destination: Home  DVT prophylaxis.  Lovenox Time spent: 40 minutes  This record has been created using Systems analyst. Errors have been sought and corrected,but may not always be located. Such creation errors do not reflect on the standard of care.   Author: Lorella Nimrod, MD 12/14/2022 3:31 PM  For on call review [email or web address].

## 2022-12-14 NOTE — TOC Progression Note (Signed)
Transition of Care [facility]) - Progression Note    Patient Details  Name: Ryan Maxwell MRN: [id number] Date of Birth: [birth year]  Transition of Care [facility]) CM/SW [place], [place] Phone Number: 12/14/2022, 9:37 AM  Clinical Narrative:      Insurance authorization started for NIV through [place]. Caryl Pina with [employer] will notify once they have authorization to provide NIV to patient.       Expected Discharge Plan and Services                                               Social Determinants of Health (SDOH) Interventions SDOH Screenings   Food Insecurity: No Food Insecurity (07/03/2022)  Housing: Low Risk  (07/03/2022)  Transportation Needs: No Transportation Needs (07/03/2022)  Utilities: Not At Risk (07/03/2022)  Tobacco Use: High Risk (09/13/2022)    Readmission Risk Interventions     No data to display

## 2022-12-14 NOTE — Progress Notes (Signed)
Patient arrived to unit in wheelchair.  Patient alert, oriented x4; oriented to room and unit.  Patient transferred to bed with supervision.

## 2022-12-14 NOTE — Discharge Planning (Signed)
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  He will need a Dr. Note for his employer - estimating when he can return to work safely.

## 2022-12-14 NOTE — Discharge Planning (Signed)
Patient barely maintaining SPO2 (90%) on RA at rest.  Ambulated in in hall RA SPO2 quickly fell  to 85% (Patient was symptomatic, chest tight and winded) Ambulated in hall on [facility] and 90-92% SPO2.  Currently, patient will need Home O2 for at least activity. Education completed and patient informed - O2 may be needed for further healing at home, but we anticipate it being temporary till he heals and resumes strength.

## 2022-12-15 DIAGNOSIS — I10 Essential (primary) hypertension: Secondary | ICD-10-CM | POA: Diagnosis not present

## 2022-12-15 DIAGNOSIS — Z720 Tobacco use: Secondary | ICD-10-CM | POA: Diagnosis not present

## 2022-12-15 DIAGNOSIS — J9601 Acute respiratory failure with hypoxia: Secondary | ICD-10-CM | POA: Diagnosis not present

## 2022-12-15 LAB — GLUCOSE, CAPILLARY
Glucose-Capillary: 115 mg/dL — ABNORMAL HIGH (ref 70–99)
Glucose-Capillary: 135 mg/dL — ABNORMAL HIGH (ref 70–99)
Glucose-Capillary: 183 mg/dL — ABNORMAL HIGH (ref 70–99)
Glucose-Capillary: 213 mg/dL — ABNORMAL HIGH (ref 70–99)
Glucose-Capillary: 184 mg/dL — ABNORMAL HIGH (ref 70–99)

## 2022-12-15 LAB — HEMOGLOBIN A1C
Hgb A1c MFr Bld: 6.5 % — ABNORMAL HIGH (ref 4.8–5.6)
Mean Plasma Glucose: 140 mg/dL

## 2022-12-15 MED ORDER — IPRATROPIUM-ALBUTEROL 0.5-2.5 (3) MG/3ML IN SOLN
3.0000 mL | Freq: Two times a day (BID) | RESPIRATORY_TRACT | Status: DC
  Administered 2022-12-15 – 2022-12-16 (×2): 3 mL via RESPIRATORY_TRACT
  Filled 2022-12-15 (×2): qty 3

## 2022-12-15 MED ORDER — ENOXAPARIN SODIUM 80 MG/0.8ML IJ SOSY
0.5000 mg/kg | PREFILLED_SYRINGE | INTRAMUSCULAR | Status: DC
  Administered 2022-12-15 – 2022-12-17 (×3): 75 mg via SUBCUTANEOUS
  Filled 2022-12-15 (×3): qty 0.75

## 2022-12-15 NOTE — Progress Notes (Signed)
Progress Note   Patient: Ryan Maxwell [id number] DOB: 12/11/1985 DOA: 12/11/2022     4 DOS: the patient was seen and examined on 12/15/2022   Brief hospital course: Taken from H&P.  Joseh Mazmanian Gowen is a 37 y.o. male with medical history significant of obesity, tobacco abuse, hypertension, OSA pending sleep study presenting with acute respiratory failure with hypoxia.   Patient noted had multiple admissions for similar issues over the past 3 years.  Many of these admissions requiring BiPAP.  There has been some concern for obstructive sleep apnea in the setting of morbid obesity.  Patient has yet to have had a sleep study done.  Per report,  patient still smoking 1 pack/day.  No reported fevers or chills.  On presentation patient was afebrile, saturating in 80s on room air, initially placed on 4 L and later transition to BiPAP when ABG shows significant hypercarbia and hypoxia. COVID, flu and RSV negative. Troponin negative x 1. EKG was sinus tachycardia.  Chest x-ray grossly within normal limits.   3/6: Blood pressure elevated 175/95 this morning.  Labs with mild hyperkalemia at 5.2-giving Veltassa.  Troponin negative.  BNP normal, procalcitonin negative.  D-dimer normal. CBC with worsening leukocytosis which can be secondary to steroid use. Patient was able to wean off from BiPAP to heated high flow.  Multiple antihypertensives listed in his chart but he was not taking them apparently.  Start him on lisinopril and amlodipine. Patient with morbid obesity and will get benefit from NIV at home. We will ask TOC to help.  3/7: Vital stable, remained on heated high flow at 35 L and FiO2 of 30%.  We will try weaning.  Also send a message to [facility] to look for NIV for discharge.  acute on chronic respiratory failure second to probable COPD, needs non-invasive ventilator to reduce recurrent hospitalizations as a result of exacerbations. CPAP and bi-level PAP are ruled out as viable treatment due  to pts deconditioning"   3/8: Seems improving, currently on 3 L of oxygen.  ABG this morning with compensated respiratory acidosis with CO2 of 65.  Rest of the labs stable.  TOC is trying to get him NIV to prevent further hospitalization.  Home oxygen was also ordered.  Awaiting insurance authorization before discharge.  Patient with significant desaturation when he was taken off from BiPAP at night.  3/9: Patient continued to improve.  Able to wean back to room air while resting.  Still awaiting insurance authorization for NIV as continue to have significant desaturation while sleeping along with retention of carbon dioxide.   Assessment and Plan: * Acute respiratory failure with hypoxia and hypercapnia (HCC) Suspect secondary to possible viral illness/bronchitis with chronic contributions including obesity hypoventilation syndrome/OSA not on CPAP, tobacco abuse, likely subacute versus undiagnosed obstructive lung disease Positive rhinorrhea, nasal congestion for 2 to 3 days per report with noted sick contact and young son with similar symptoms.  Respiratory viral panel negative. Requiring BiPAP in the ER-now on heated high flow Noted compensated respiratory acidosis on ABG Chest x-ray within normal limits Covid, flu, RSV negative  -Continue with steroid and doxycycline-will complete a 5-day course -Patient will get benefit from NIV for home as he had significant hypercarbia on ABG. -Continue with supplemental oxygen-wean as tolerated -Continue with supportive care  Essential hypertension Blood pressure improving.  Patient was not taking his home antihypertensives. -Restarting home lisinopril and amlodipine -Continue to monitor  Obesity, Class III, BMI 40-49.9 (morbid obesity) (HCC) Estimated body mass  index is 47.48 kg/m as calculated from the following:   Height as of this encounter: 6' (1.829 m).   Weight as of this encounter: 158.8 kg.   -This will complicate overall  prognosis. -High risk for hypoventilation syndrome  Tobacco abuse Baseline 1 pack/day smoker Nicotine patch Counseling was provided  Hypersomnolence Recurring issue in the setting of untreated obstructive sleep apnea Patient is known to have been admitted multiple times for similar issues Consider sleep study in house if available so as to patient receiving CPAP prior to discharge Will ask TOC to see if he can get him BiPAP for home  Chest pain Resolved.  Troponin and D-dimer negative. EKG without any acute changes. -Continue to monitor  OSA (obstructive sleep apnea) Recurring issue with patient not having yet obtained outpatient sleep study With benefit from a patient sleep study if possible and discharge with CPAP TOC is looking for options   Subjective: Patient was seen and examined today.  No new complaints.  Wants to go home.  Physical Exam: Vitals:   12/15/22 0525 12/15/22 0745 12/15/22 0750 12/15/22 0846  BP: (!) 150/104  (!) 136/96   Pulse: 80  83   Resp: 18  16   Temp:   98.8 F (37.1 C)   TempSrc:      SpO2: 90% 98% 99% 91%  Weight:      Height:       General.  Morbidly obese gentleman, in no acute distress. Pulmonary.  Lungs clear bilaterally, normal respiratory effort. CV.  Regular rate and rhythm, no JVD, rub or murmur. Abdomen.  Soft, nontender, nondistended, BS positive. CNS.  Alert and oriented .  No focal neurologic deficit. Extremities.  No edema, no cyanosis, pulses intact and symmetrical. Psychiatry.  Judgment and insight appears normal.      Data Reviewed: Prior data reviewed  Family Communication: Discussed with patient  Disposition: Status is: Inpatient Remains inpatient appropriate because: Severity of illness  Planned Discharge Destination: Home  DVT prophylaxis.  Lovenox Time spent: 38 minutes  This record has been created using Systems analyst. Errors have been sought and corrected,but may not always be located.  Such creation errors do not reflect on the standard of care.   Author: Lorella Nimrod, MD 12/15/2022 3:13 PM  For on call review [email or web address].

## 2022-12-15 NOTE — TOC Progression Note (Signed)
Transition of Care [facility]) - Progression Note    Patient Details  Name: Ryan Maxwell MRN: [id number] Date of Birth: 05/02/1986  Transition of Care [facility]) CM/SW [place], [place] Phone Number: 12/15/2022, 1:00 PM  Clinical Narrative:     CSW spoke with Dayla at [facility] customer service, she states pt's Ryan Maxwell is still pending, MD notified.         Expected Discharge Plan and Services                                               Social Determinants of Health (SDOH) Interventions SDOH Screenings   Food Insecurity: No Food Insecurity (07/03/2022)  Housing: Low Risk  (07/03/2022)  Transportation Needs: No Transportation Needs (07/03/2022)  Utilities: Not At Risk (07/03/2022)  Tobacco Use: High Risk (09/13/2022)    Readmission Risk Interventions     No data to display

## 2022-12-16 DIAGNOSIS — J9601 Acute respiratory failure with hypoxia: Secondary | ICD-10-CM | POA: Diagnosis not present

## 2022-12-16 DIAGNOSIS — I10 Essential (primary) hypertension: Secondary | ICD-10-CM | POA: Diagnosis not present

## 2022-12-16 DIAGNOSIS — Z720 Tobacco use: Secondary | ICD-10-CM | POA: Diagnosis not present

## 2022-12-16 LAB — GLUCOSE, CAPILLARY
Glucose-Capillary: 111 mg/dL — ABNORMAL HIGH (ref 70–99)
Glucose-Capillary: 110 mg/dL — ABNORMAL HIGH (ref 70–99)
Glucose-Capillary: 123 mg/dL — ABNORMAL HIGH (ref 70–99)
Glucose-Capillary: 153 mg/dL — ABNORMAL HIGH (ref 70–99)

## 2022-12-16 MED ORDER — IPRATROPIUM-ALBUTEROL 0.5-2.5 (3) MG/3ML IN SOLN
3.0000 mL | Freq: Four times a day (QID) | RESPIRATORY_TRACT | Status: DC | PRN
  Administered 2022-12-16: 3 mL via RESPIRATORY_TRACT
  Filled 2022-12-16: qty 3

## 2022-12-16 MED ORDER — INSULIN GLARGINE-YFGN 100 UNIT/ML ~~LOC~~ SOLN: 10.0000 [IU] | Freq: Every day | SUBCUTANEOUS | Status: DC

## 2022-12-16 MED ORDER — LISINOPRIL 20 MG PO TABS
40.0000 mg | ORAL_TABLET | Freq: Every day | ORAL | Status: DC
  Administered 2022-12-17 – 2022-12-18 (×2): 40 mg via ORAL
  Filled 2022-12-16 (×2): qty 2

## 2022-12-16 NOTE — Progress Notes (Addendum)
Mobility Specialist - Progress Note    12/16/22 1300  Mobility  Activity Ambulated independently in hallway;Stood at bedside;Dangled on edge of bed  Level of Assistance Independent  Assistive Device None  Distance Ambulated (ft) 160 ft  Range of Motion/Exercises Active  Activity Response Tolerated well  Mobility Referral Yes  $Mobility charge 1 Mobility   Pt OOB ambulating in room upon entry. Pt STS and ambulates to hallway around NS for 1 lap. Pt returned to recliner and left with needs in reach.   Loma Sender Mobility Specialist 12/16/22, 1:41 PM

## 2022-12-16 NOTE — Progress Notes (Signed)
Progress Note   Patient: Ryan Maxwell [id number] DOB: [birth year] DOA: 12/11/2022     5 DOS: the patient was seen and examined on 12/16/2022   Brief hospital course: Taken from H&P.  Orion Klopf Ashlock is a 37 y.o. male with medical history significant of obesity, tobacco abuse, hypertension, OSA pending sleep study presenting with acute respiratory failure with hypoxia.   Patient noted had multiple admissions for similar issues over the past 3 years.  Many of these admissions requiring BiPAP.  There has been some concern for obstructive sleep apnea in the setting of morbid obesity.  Patient has yet to have had a sleep study done.  Per report,  patient still smoking 1 pack/day.  No reported fevers or chills.  On presentation patient was afebrile, saturating in 80s on room air, initially placed on 4 L and later transition to BiPAP when ABG shows significant hypercarbia and hypoxia. COVID, flu and RSV negative. Troponin negative x 1. EKG was sinus tachycardia.  Chest x-ray grossly within normal limits.   3/6: Blood pressure elevated 175/95 this morning.  Labs with mild hyperkalemia at 5.2-giving Veltassa.  Troponin negative.  BNP normal, procalcitonin negative.  D-dimer normal. CBC with worsening leukocytosis which can be secondary to steroid use. Patient was able to wean off from BiPAP to heated high flow.  Multiple antihypertensives listed in his chart but he was not taking them apparently.  Start him on lisinopril and amlodipine. Patient with morbid obesity and will get benefit from NIV at home. We will ask TOC to help.  3/7: Vital stable, remained on heated high flow at 35 L and FiO2 of 30%.  We will try weaning.  Also send a message to [facility] to look for NIV for discharge.  acute on chronic respiratory failure second to probable COPD, needs non-invasive ventilator to reduce recurrent hospitalizations as a result of exacerbations. CPAP and bi-level PAP are ruled out as viable treatment due  to pts deconditioning"   3/8: Seems improving, currently on 3 L of oxygen.  ABG this morning with compensated respiratory acidosis with CO2 of 65.  Rest of the labs stable.  TOC is trying to get him NIV to prevent further hospitalization.  Home oxygen was also ordered.  Awaiting insurance authorization before discharge.  Patient with significant desaturation when he was taken off from BiPAP at night.  3/9: Patient continued to improve.  Able to wean back to room air while resting.  Still awaiting insurance authorization for NIV as continue to have significant desaturation while sleeping along with retention of carbon dioxide.  3/10: Patient remained stable.  Able to wean back to room air while ambulating but continued to require BiPAP with significant desaturation while sleeping.  Awaiting insurance authorization for NIV to go home.   Assessment and Plan: * Acute respiratory failure with hypoxia and hypercapnia (HCC) Suspect secondary to possible viral illness/bronchitis with chronic contributions including obesity hypoventilation syndrome/OSA not on CPAP, tobacco abuse, likely subacute versus undiagnosed obstructive lung disease. Respiratory viral panel negative. Chest x-ray within normal limits Weaned back to room air but continued to require BiPAP to sleep due to significant hypercarbia.  Completed the course of steroid and doxycycline. -Awaiting NIV insurance approval. -Continue with supportive care  Essential hypertension Blood pressure remained mildly elevated.  Patient was not taking his home antihypertensives. -Continue home amlodipine -Increase the dose of lisinopril to 40 -Continue to monitor  Obesity, Class III, BMI 40-49.9 (morbid obesity) (HCC) Estimated body mass index is 47.48  kg/m as calculated from the following:   Height as of this encounter: 6' (1.829 m).   Weight as of this encounter: 158.8 kg.   -This will complicate overall prognosis. -High risk for hypoventilation  syndrome  Tobacco abuse Baseline 1 pack/day smoker Nicotine patch Counseling was provided  Hypersomnolence Recurring issue in the setting of untreated obstructive sleep apnea Patient is known to have been admitted multiple times for similar issues Consider sleep study in house if available so as to patient receiving CPAP prior to discharge Will ask TOC to see if he can get him BiPAP for home  Chest pain Resolved.  Troponin and D-dimer negative. EKG without any acute changes. -Continue to monitor  OSA (obstructive sleep apnea) Recurring issue with patient not having yet obtained outpatient sleep study With benefit from a patient sleep study if possible and discharge with CPAP TOC is looking for options   Subjective: Patient with no new complaints.  Now able to ambulate without oxygen.  Physical Exam: Vitals:   12/16/22 0500 12/16/22 0527 12/16/22 0729 12/16/22 0814  BP:  (!) 138/99  (!) 147/98  Pulse:  76  82  Resp:  19  17  Temp:  97.6 F (36.4 C)  (!) 97.2 F (36.2 C)  TempSrc:      SpO2:  93% 96% 95%  Weight: (!) 152.2 kg     Height:       General.  Morbidly obese gentleman, in no acute distress. Pulmonary.  Lungs clear bilaterally, normal respiratory effort. CV.  Regular rate and rhythm, no JVD, rub or murmur. Abdomen.  Soft, nontender, nondistended, BS positive. CNS.  Alert and oriented .  No focal neurologic deficit. Extremities.  No edema, no cyanosis, pulses intact and symmetrical. Psychiatry.  Judgment and insight appears normal.      Data Reviewed: Prior data reviewed  Family Communication: Discussed with patient  Disposition: Status is: Inpatient Remains inpatient appropriate because: Severity of illness  Planned Discharge Destination: Home  DVT prophylaxis.  Lovenox Time spent: 39 minutes  This record has been created using Systems analyst. Errors have been sought and corrected,but may not always be located. Such creation  errors do not reflect on the standard of care.   Author: Lorella Nimrod, MD 12/16/2022 2:14 PM  For on call review [email or web address].

## 2022-12-16 NOTE — TOC Progression Note (Signed)
Transition of Care [facility]) - Progression Note    Patient Details  Name: Ryan Maxwell MRN: [id number] Date of Birth: 04/02/1986  [facility]) CM/SW Contact  Gerilyn Pilgrim, LCSW Phone Number: 12/16/2022, 10:53 AM  Clinical Narrative:   Still no update on auth from [place]. CSW spoke with Caryl Pina at [place].          Expected Discharge Plan and Services                                               Social Determinants of Health (SDOH) Interventions SDOH Screenings   Food Insecurity: No Food Insecurity (07/03/2022)  Housing: Low Risk  (07/03/2022)  Transportation Needs: No Transportation Needs (07/03/2022)  Utilities: Not At Risk (07/03/2022)  Tobacco Use: High Risk (09/13/2022)    Readmission Risk Interventions     No data to display

## 2022-12-17 DIAGNOSIS — Z720 Tobacco use: Secondary | ICD-10-CM | POA: Diagnosis not present

## 2022-12-17 DIAGNOSIS — J9601 Acute respiratory failure with hypoxia: Secondary | ICD-10-CM | POA: Diagnosis not present

## 2022-12-17 DIAGNOSIS — I10 Essential (primary) hypertension: Secondary | ICD-10-CM | POA: Diagnosis not present

## 2022-12-17 LAB — GLUCOSE, CAPILLARY
Glucose-Capillary: 108 mg/dL — ABNORMAL HIGH (ref 70–99)
Glucose-Capillary: 179 mg/dL — ABNORMAL HIGH (ref 70–99)
Glucose-Capillary: 169 mg/dL — ABNORMAL HIGH (ref 70–99)
Glucose-Capillary: 118 mg/dL — ABNORMAL HIGH (ref 70–99)

## 2022-12-17 NOTE — Progress Notes (Signed)
Progress Note   Patient: Ryan Maxwell [id number] DOB: [birth year] DOA: 12/11/2022     6 DOS: the patient was seen and examined on 12/17/2022   Brief hospital course: Taken from H&P.  Ryan Maxwell is a 37 y.o. male with medical history significant of obesity, tobacco abuse, hypertension, OSA pending sleep study presenting with acute respiratory failure with hypoxia.   Patient noted had multiple admissions for similar issues over the past 3 years.  Many of these admissions requiring BiPAP.  There has been some concern for obstructive sleep apnea in the setting of morbid obesity.  Patient has yet to have had a sleep study done.  Per report,  patient still smoking 1 pack/day.  No reported fevers or chills.  On presentation patient was afebrile, saturating in 80s on room air, initially placed on 4 L and later transition to BiPAP when ABG shows significant hypercarbia and hypoxia. COVID, flu and RSV negative. Troponin negative x 1. EKG was sinus tachycardia.  Chest x-ray grossly within normal limits.   3/6: Blood pressure elevated 175/95 this morning.  Labs with mild hyperkalemia at 5.2-giving Veltassa.  Troponin negative.  BNP normal, procalcitonin negative.  D-dimer normal. CBC with worsening leukocytosis which can be secondary to steroid use. Patient was able to wean off from BiPAP to heated high flow.  Multiple antihypertensives listed in his chart but he was not taking them apparently.  Start him on lisinopril and amlodipine. Patient with morbid obesity and will get benefit from NIV at home. We will ask TOC to help.  3/7: Vital stable, remained on heated high flow at 35 L and FiO2 of 30%.  We will try weaning.  Also send a message to [facility] to look for NIV for discharge.  acute on chronic respiratory failure second to probable COPD, needs non-invasive ventilator to reduce recurrent hospitalizations as a result of exacerbations. CPAP and bi-level PAP are ruled out as viable treatment due  to pts deconditioning"   3/8: Seems improving, currently on 3 L of oxygen.  ABG this morning with compensated respiratory acidosis with CO2 of 65.  Rest of the labs stable.  TOC is trying to get him NIV to prevent further hospitalization.  Home oxygen was also ordered.  Awaiting insurance authorization before discharge.  Patient with significant desaturation when he was taken off from BiPAP at night.  3/9: Patient continued to improve.  Able to wean back to room air while resting.  Still awaiting insurance authorization for NIV as continue to have significant desaturation while sleeping along with retention of carbon dioxide.  3/10: Patient remained stable.  Able to wean back to room air while ambulating but continued to require BiPAP with significant desaturation while sleeping.  Awaiting insurance authorization for NIV to go home.  3/11: Patient remained stable and eager to go home.  Remained on room air but continued to desaturate requiring BiPAP at night.  Blood pressure has been improved on current regimen which include amlodipine, lisinopril and HCTZ.  We restarted home amlodipine and HCTZ and increase the dose of home lisinopril.  Patient need continuation of encouragement and counseling to lose weight.  Patient need to have a close follow-up with primary care provider for further management.   Assessment and Plan: * Acute respiratory failure with hypoxia and hypercapnia (HCC) Suspect secondary to possible viral illness/bronchitis with chronic contributions including obesity hypoventilation syndrome/OSA not on CPAP, tobacco abuse, likely subacute versus undiagnosed obstructive lung disease. Respiratory viral panel negative. Chest x-ray within  normal limits Weaned back to room air but continued to require BiPAP to sleep due to significant hypercarbia.  Completed the course of steroid and doxycycline. -Awaiting NIV insurance approval. -Continue with supportive care  Essential  hypertension Blood pressure remained mildly elevated.  Patient was not taking his home antihypertensives. -Continue home amlodipine -Increase the dose of lisinopril to 40 -Continue to monitor  Obesity, Class III, BMI 40-49.9 (morbid obesity) (HCC) Estimated body mass index is 47.48 kg/m as calculated from the following:   Height as of this encounter: 6' (1.829 m).   Weight as of this encounter: 158.8 kg.   -This will complicate overall prognosis. -High risk for hypoventilation syndrome  Tobacco abuse Baseline 1 pack/day smoker Nicotine patch Counseling was provided  Hypersomnolence Recurring issue in the setting of untreated obstructive sleep apnea Patient is known to have been admitted multiple times for similar issues Consider sleep study in house if available so as to patient receiving CPAP prior to discharge Will ask TOC to see if he can get him BiPAP for home  Chest pain Resolved.  Troponin and D-dimer negative. EKG without any acute changes. -Continue to monitor  OSA (obstructive sleep apnea) Recurring issue with patient not having yet obtained outpatient sleep study With benefit from a patient sleep study if possible and discharge with CPAP TOC is looking for options   Subjective: Patient with no new complaints, wants to get out of hospital.  Physical Exam: Vitals:   12/17/22 0500 12/17/22 0928 12/17/22 1639 12/17/22 2100  BP:  (!) 157/99 (!) 143/87 (!) 141/104  Pulse:  96 94 93  Resp:  '16 18 18  '$ Temp:  98.3 F (36.8 C) 98 F (36.7 C) 97.9 F (36.6 C)  TempSrc:  Oral    SpO2:  91% 90% 93%  Weight: (!) 152 kg     Height:       General. Obese gentleman, In no acute distress. Pulmonary.  Lungs clear bilaterally, normal respiratory effort. CV.  Regular rate and rhythm, no JVD, rub or murmur. Abdomen.  Soft, nontender, nondistended, BS positive. CNS.  Alert and oriented .  No focal neurologic deficit. Extremities.  No edema, no cyanosis, pulses intact and  symmetrical. Psychiatry.  Judgment and insight appears normal.    Data Reviewed: Prior data reviewed  Family Communication: Discussed with patient  Disposition: Status is: Inpatient Remains inpatient appropriate because: Severity of illness  Planned Discharge Destination: Home  DVT prophylaxis.  Lovenox Time spent: 38 minutes  This record has been created using Systems analyst. Errors have been sought and corrected,but may not always be located. Such creation errors do not reflect on the standard of care.   Author: Lorella Nimrod, MD 12/17/2022 9:32 PM  For on call review [email or web address].

## 2022-12-17 NOTE — Discharge Summary (Signed)
Physician Discharge Summary   Patient: Ryan Maxwell MRN: [id number] DOB: 01/23/1986  Admit date:     12/11/2022  Discharge date: {dischdate:26783}  Discharge Physician: Lorella Nimrod   PCP: Pcp, No   Recommendations at discharge:  Please obtain CBC and BMP in 1 week Follow-up with primary care provider within a week Please encourage weight loss Please ensure using BiPAP at night  Discharge Diagnoses: Principal Problem:   Acute respiratory failure with hypoxia and hypercapnia ([place]) Active Problems:   Essential hypertension   Obesity, Class III, BMI 40-49.9 (morbid obesity) (HCC)   Tobacco abuse   Hypersomnolence   Chest pain   OSA (obstructive sleep apnea)   Acute respiratory failure due to COVID-19 [facility])   Acute on chronic respiratory failure with hypoxia [facility])   Hospital Course: Taken from H&P.  Ryan Maxwell is a 37 y.o. male with medical history significant of obesity, tobacco abuse, hypertension, OSA pending sleep study presenting with acute respiratory failure with hypoxia.   Patient noted had multiple admissions for similar issues over the past 3 years.  Many of these admissions requiring BiPAP.  There has been some concern for obstructive sleep apnea in the setting of morbid obesity.  Patient has yet to have had a sleep study done.  Per report,  patient still smoking 1 pack/day.  No reported fevers or chills.  On presentation patient was afebrile, saturating in 80s on room air, initially placed on 4 L and later transition to BiPAP when ABG shows significant hypercarbia and hypoxia. COVID, flu and RSV negative. Troponin negative x 1. EKG was sinus tachycardia.  Chest x-ray grossly within normal limits.   3/6: Blood pressure elevated 175/95 this morning.  Labs with mild hyperkalemia at 5.2-giving [place].  Troponin negative.  BNP normal, procalcitonin negative.  D-dimer normal. CBC with worsening leukocytosis which can be secondary to steroid use. Patient was able  to wean off from BiPAP to heated high flow.  Multiple antihypertensives listed in his chart but he was not taking them apparently.  Start him on lisinopril and amlodipine. Patient with morbid obesity and will get benefit from NIV at home. We will ask TOC to help.  3/7: Vital stable, remained on heated high flow at 35 L and FiO2 of 30%.  We will try weaning.  Also send a message to [facility] to look for NIV for discharge.  acute on chronic respiratory failure second to probable COPD, needs non-invasive ventilator to reduce recurrent hospitalizations as a result of exacerbations. CPAP and bi-level PAP are ruled out as viable treatment due to pts deconditioning"   3/8: Seems improving, currently on 3 L of oxygen.  ABG this morning with compensated respiratory acidosis with CO2 of 65.  Rest of the labs stable.  TOC is trying to get him NIV to prevent further hospitalization.  Home oxygen was also ordered.  Awaiting insurance authorization before discharge.  Patient with significant desaturation when he was taken off from BiPAP at night.  3/9: Patient continued to improve.  Able to wean back to room air while resting.  Still awaiting insurance authorization for NIV as continue to have significant desaturation while sleeping along with retention of carbon dioxide.  3/10: Patient remained stable.  Able to wean back to room air while ambulating but continued to require BiPAP with significant desaturation while sleeping.  Awaiting insurance authorization for NIV to go home.  Assessment and Plan: * Acute respiratory failure with hypoxia and hypercapnia (HCC) Suspect secondary to possible viral  illness/bronchitis with chronic contributions including obesity hypoventilation syndrome/OSA not on CPAP, tobacco abuse, likely subacute versus undiagnosed obstructive lung disease. Respiratory viral panel negative. Chest x-ray within normal limits Weaned back to room air but continued to require BiPAP to sleep due to  significant hypercarbia.  Completed the course of steroid and doxycycline. -Awaiting NIV insurance approval. -Continue with supportive care  Essential hypertension Blood pressure remained mildly elevated.  Patient was not taking his home antihypertensives. -Continue home amlodipine -Increase the dose of lisinopril to 40 -Continue to monitor  Obesity, Class III, BMI 40-49.9 (morbid obesity) (HCC) Estimated body mass index is 47.48 kg/m as calculated from the following:   Height as of this encounter: 6' (1.829 m).   Weight as of this encounter: 158.8 kg.   -This will complicate overall prognosis. -High risk for hypoventilation syndrome  Tobacco abuse Baseline 1 pack/day smoker Nicotine patch Counseling was provided  Hypersomnolence Recurring issue in the setting of untreated obstructive sleep apnea Patient is known to have been admitted multiple times for similar issues Consider sleep study in house if available so as to patient receiving CPAP prior to discharge Will ask TOC to see if he can get him BiPAP for home  Chest pain Resolved.  Troponin and D-dimer negative. EKG without any acute changes. -Continue to monitor  OSA (obstructive sleep apnea) Recurring issue with patient not having yet obtained outpatient sleep study With benefit from a patient sleep study if possible and discharge with CPAP TOC is looking for options      {Tip this will not be part of the note when signed Body mass index is 45.45 kg/m. ,  Nutrition Documentation    [place] ED to Hosp-Admission (Current) from 12/11/2022 in [facility]  Nutrition Problem Increased nutrient needs  Etiology chronic illness  [COPD]  Nutrition Goal Patient will meet greater than or equal to 90% of their needs     ,  (Optional):26781}   Consultants: None Procedures performed: None Disposition: Home Diet recommendation:  Cardiac and Carb modified diet DISCHARGE  MEDICATION: Allergies as of 12/17/2022       Reactions   Dayquil [pseudoephedrine-apap-dm] Shortness Of Breath   Nyquil Multi-symptom [pseudoeph-doxylamine-dm-apap] Shortness Of Breath     Med Rec must be completed prior to using this [facility]***        Durable Medical Equipment  (From admission, onward)           Start     Ordered   12/14/22 1056  For home use only DME oxygen  Once       Question Answer Comment  Length of Need Lifetime   Mode or (Route) Nasal cannula   Liters per Minute 3   Frequency Continuous (stationary and portable oxygen unit needed)   Oxygen conserving device Yes   Oxygen delivery system Gas      12/14/22 1056            Discharge Exam: Filed Weights   12/15/22 0500 12/16/22 0500 12/17/22 0500  Weight: (!) 152.4 kg (!) 152.2 kg (!) 152 kg   General.  Obese gentleman, in no acute distress. Pulmonary.  Lungs clear bilaterally, normal respiratory effort. CV.  Regular rate and rhythm, no JVD, rub or murmur. Abdomen.  Soft, nontender, nondistended, BS positive. CNS.  Alert and oriented .  No focal neurologic deficit. Extremities.  No edema, no cyanosis, pulses intact and symmetrical. Psychiatry.  Judgment and insight appears normal.   Condition at discharge:  stable  The results of significant diagnostics from this hospitalization (including imaging, microbiology, ancillary and laboratory) are listed below for reference.   Imaging Studies: DG Chest Port 1 View  Result Date: 12/11/2022 CLINICAL DATA:  [id number] Chest pain [id number] EXAM: PORTABLE CHEST 1 VIEW COMPARISON:  Chest XR, most recently 06/02/2022 and 04/22/2022. CT chest, 10/09/2022. FINDINGS: Cardiomediastinal silhouette is within normal limits given degree of inflation. Mild relative hypoinflation with trace linear streaky perihilar and basilar opacities. No focal consolidation or mass. No pleural effusion or pneumothorax. No acute displaced fracture. IMPRESSION: Mild hypoinflation  without acute superimposed cardiopulmonary process. Electronically Signed   By: Michaelle Birks M.D.   On: 12/11/2022 08:33    Microbiology: Results for orders placed or performed during the hospital encounter of 12/11/22  Resp panel by RT-PCR (RSV, Flu A&B, Covid) Anterior Nasal Swab     Status: None   Collection Time: 12/11/22  8:01 AM   Specimen: Anterior Nasal Swab  Result Value Ref Range Status   SARS Coronavirus 2 by RT PCR NEGATIVE NEGATIVE Final    Comment: (NOTE) SARS-CoV-2 target nucleic acids are NOT DETECTED.  The SARS-CoV-2 RNA is generally detectable in upper respiratory specimens during the acute phase of infection. The lowest concentration of SARS-CoV-2 viral copies this assay can detect is 138 copies/mL. A negative result does not preclude SARS-Cov-2 infection and should not be used as the sole basis for treatment or other patient management decisions. A negative result may occur with  improper specimen collection/handling, submission of specimen other than nasopharyngeal swab, presence of viral mutation(s) within the areas targeted by this assay, and inadequate number of viral copies(<138 copies/mL). A negative result must be combined with clinical observations, patient history, and epidemiological information. The expected result is Negative.  Fact Sheet for Patients:  EntrepreneurPulse.com.au  Fact Sheet for Healthcare Providers:  IncredibleEmployment.be  This test is no t yet approved or cleared by the [place] FDA and  has been authorized for detection and/or diagnosis of SARS-CoV-2 by FDA under an Emergency Use Authorization (EUA). This EUA will remain  in effect (meaning this test can be used) for the duration of the COVID-19 declaration under Section 564(b)(1) of the Act, 21 U.S.C.section 360bbb-3(b)(1), unless the authorization is terminated  or revoked sooner.       Influenza A by PCR NEGATIVE NEGATIVE Final    Influenza B by PCR NEGATIVE NEGATIVE Final    Comment: (NOTE) The Xpert Xpress SARS-CoV-2/FLU/RSV plus assay is intended as an aid in the diagnosis of influenza from Nasopharyngeal swab specimens and should not be used as a sole basis for treatment. Nasal washings and aspirates are unacceptable for Xpert Xpress SARS-CoV-2/FLU/RSV testing.  Fact Sheet for Patients: EntrepreneurPulse.com.au  Fact Sheet for Healthcare Providers: IncredibleEmployment.be  This test is not yet approved or cleared by the [place] FDA and has been authorized for detection and/or diagnosis of SARS-CoV-2 by FDA under an Emergency Use Authorization (EUA). This EUA will remain in effect (meaning this test can be used) for the duration of the COVID-19 declaration under Section 564(b)(1) of the Act, 21 U.S.C. section 360bbb-3(b)(1), unless the authorization is terminated or revoked.     Resp Syncytial Virus by PCR NEGATIVE NEGATIVE Final    Comment: (NOTE) Fact Sheet for Patients: EntrepreneurPulse.com.au  Fact Sheet for Healthcare Providers: IncredibleEmployment.be  This test is not yet approved or cleared by the [place] FDA and has been authorized for detection and/or diagnosis of SARS-CoV-2 by FDA under an Emergency Use  Authorization (EUA). This EUA will remain in effect (meaning this test can be used) for the duration of the COVID-19 declaration under Section 564(b)(1) of the Act, 21 U.S.C. section 360bbb-3(b)(1), unless the authorization is terminated or revoked.  Performed at [facility], [place], [place] [address]   Respiratory (~20 pathogens) panel by PCR     Status: None   Collection Time: 12/11/22  6:54 PM   Specimen: Nasopharyngeal Swab; Respiratory  Result Value Ref Range Status   Adenovirus NOT DETECTED NOT DETECTED Final   Coronavirus 229E NOT DETECTED NOT DETECTED Final     Comment: (NOTE) The Coronavirus on the Respiratory Panel, DOES NOT test for the novel  Coronavirus (2019 nCoV)    Coronavirus HKU1 NOT DETECTED NOT DETECTED Final   Coronavirus NL63 NOT DETECTED NOT DETECTED Final   Coronavirus OC43 NOT DETECTED NOT DETECTED Final   Metapneumovirus NOT DETECTED NOT DETECTED Final   Rhinovirus / Enterovirus NOT DETECTED NOT DETECTED Final   Influenza A NOT DETECTED NOT DETECTED Final   Influenza B NOT DETECTED NOT DETECTED Final   Parainfluenza Virus 1 NOT DETECTED NOT DETECTED Final   Parainfluenza Virus 2 NOT DETECTED NOT DETECTED Final   Parainfluenza Virus 3 NOT DETECTED NOT DETECTED Final   Parainfluenza Virus 4 NOT DETECTED NOT DETECTED Final   Respiratory Syncytial Virus NOT DETECTED NOT DETECTED Final   Bordetella pertussis NOT DETECTED NOT DETECTED Final   Bordetella Parapertussis NOT DETECTED NOT DETECTED Final   Chlamydophila pneumoniae NOT DETECTED NOT DETECTED Final   Mycoplasma pneumoniae NOT DETECTED NOT DETECTED Final    Comment: Performed at [facility], [place]. [address] [place], [place] [address]  MRSA Next Gen by PCR, Nasal     Status: None   Collection Time: 12/12/22  9:48 AM   Specimen: Nasal Mucosa; Nasal Swab  Result Value Ref Range Status   MRSA by PCR Next Gen NOT DETECTED NOT DETECTED Final    Comment: (NOTE) The GeneXpert MRSA Assay (FDA approved for NASAL specimens only), is one component of a comprehensive MRSA colonization surveillance program. It is not intended to diagnose MRSA infection nor to guide or monitor treatment for MRSA infections. Test performance is not FDA approved in patients less than 58 years old. Performed at [facility], [address] [place], [place] [address]     Labs: CBC: Recent Labs  Lab 12/11/22 0801 12/12/22 0500 12/14/22 0903  WBC 8.1 16.3* 12.1*  NEUTROABS 4.7  --   --   HGB 15.7 15.8 15.7  HCT 53.5* 54.2* 54.7*  MCV 91.5 92.6 92.6  PLT 231 256 [id number]   Basic  Metabolic Panel: Recent Labs  Lab 12/11/22 0801 12/12/22 0500 12/14/22 0903  NA 137 141 139  K 4.2 5.2* 3.8  CL 102 103 100  CO2 29 32 32  GLUCOSE 159* 149* 99  BUN '16 16 16  '$ CREATININE 0.87 0.88 0.84  CALCIUM 8.3* 8.7* 8.6*   Liver Function Tests: Recent Labs  Lab 12/11/22 0801 12/12/22 0500  AST 34 23  ALT 45* 37  ALKPHOS 67 67  BILITOT 0.4 0.5  PROT 7.1 7.3  ALBUMIN 3.6 3.5   CBG: Recent Labs  Lab 12/16/22 1155 12/16/22 1619 12/16/22 2020 12/17/22 0813 12/17/22 1142  GLUCAP 110* 123* 153* 108* 179*    Discharge time spent: greater than 30 minutes.  This record has been created using Systems analyst. Errors have been sought and corrected,but may not  always be located. Such creation errors do not reflect on the standard of care.  Signed: Lorella Nimrod, MD [facility] 12/17/2022

## 2022-12-17 NOTE — TOC Progression Note (Signed)
Transition of Care [facility]) - Progression Note    Patient Details  Name: Ryan Maxwell MRN: [id number] Date of Birth: 1985/11/09  [facility] CM/SW Contact  Gerilyn Pilgrim, LCSW Phone Number: 12/17/2022, 2:25 PM  Clinical Narrative:   still no update on insurance auth for patients NIV.          Expected Discharge Plan and Services                                               Social Determinants of Health (SDOH) Interventions SDOH Screenings   Food Insecurity: No Food Insecurity (07/03/2022)  Housing: Low Risk  (07/03/2022)  Transportation Needs: No Transportation Needs (07/03/2022)  Utilities: Not At Risk (07/03/2022)  Tobacco Use: High Risk (09/13/2022)    Readmission Risk Interventions     No data to display

## 2022-12-18 LAB — CREATININE, SERUM
Creatinine, Ser: 0.74 mg/dL (ref 0.61–1.24)
GFR, Estimated: >60 mL/min (ref >60)

## 2022-12-18 LAB — GLUCOSE, CAPILLARY
Glucose-Capillary: 126 mg/dL — ABNORMAL HIGH (ref 70–99)
Glucose-Capillary: 124 mg/dL — ABNORMAL HIGH (ref 70–99)

## 2022-12-18 MED ORDER — AMLODIPINE BESYLATE 10 MG PO TABS: 10.0000 mg | ORAL_TABLET | Freq: Every day | ORAL | 1 refills | Status: DC

## 2022-12-18 MED ORDER — LANCET DEVICE MISC: 1.0000 | Freq: Three times a day (TID) | 0 refills | Status: AC

## 2022-12-18 MED ORDER — METFORMIN HCL ER 750 MG PO TB24: 750.0000 mg | ORAL_TABLET | Freq: Every day | ORAL | 0 refills | Status: DC

## 2022-12-18 MED ORDER — ADULT MULTIVITAMIN W/MINERALS CH: 1.0000 | ORAL_TABLET | Freq: Every day | ORAL | 1 refills | Status: DC

## 2022-12-18 MED ORDER — LANCETS MISC. MISC: 1.0000 | Freq: Three times a day (TID) | 2 refills | Status: AC

## 2022-12-18 MED ORDER — BLOOD GLUCOSE MONITORING SUPPL DEVI: 1.0000 | Freq: Three times a day (TID) | 0 refills | Status: DC

## 2022-12-18 MED ORDER — LISINOPRIL 40 MG PO TABS: 40.0000 mg | ORAL_TABLET | Freq: Every day | ORAL | 1 refills | Status: DC

## 2022-12-18 MED ORDER — BLOOD GLUCOSE TEST VI STRP: 1.0000 | ORAL_STRIP | Freq: Three times a day (TID) | 3 refills | Status: DC

## 2022-12-18 MED ORDER — HYDROCHLOROTHIAZIDE 12.5 MG PO TABS: 12.5000 mg | ORAL_TABLET | Freq: Every day | ORAL | 1 refills | Status: DC

## 2022-12-18 NOTE — Progress Notes (Signed)
Patient placed on BIPAP for HS. Tolerating well.

## 2022-12-18 NOTE — TOC Transition Note (Signed)
Transition of Care [facility]) - CM/SW Discharge Note   Patient Details  Name: Ryan Maxwell MRN: [id number] Date of Birth: 1986/03/07  Transition of Care [facility]) CM/SW Contact:  Gerilyn Pilgrim, LCSW Phone Number: 12/18/2022, 11:35 AM   Clinical Narrative:   Pt has orders to discharge home, AMA. Pt waiting several days for insurance approval for NIV. RN walked patient who no longer needs oxygen but does need his NIV. This was ordered through [place]. [employer] states there is still not Biochemist, clinical. Representative ashley notified pt does not need the oxygen but does need the NIV and pt is stating he is discharging home without this.           Patient Goals and CMS Choice      Discharge Placement                         Discharge Plan and Services Additional resources added to the After Visit Summary for                                       Social Determinants of Health (SDOH) Interventions SDOH Screenings   Food Insecurity: No Food Insecurity (07/03/2022)  Housing: Low Risk  (07/03/2022)  Transportation Needs: No Transportation Needs (07/03/2022)  Utilities: Not At Risk (07/03/2022)  Tobacco Use: High Risk (09/13/2022)     Readmission Risk Interventions     No data to display

## 2022-12-18 NOTE — Progress Notes (Signed)
Patient being discharged home.  Patient educated per AVS and voiced understanding of instructions.  Patient spouse on her way to get him and patient refuses wheelchair transport to medical mall.  All belongings packed to discharge home with patient.

## 2022-12-18 NOTE — Progress Notes (Signed)
  Patient Saturations on Room Air at Rest = 95%  Patient Saturations on [employer] while Ambulating = 92%  Patient Saturations on 2 Liters of oxygen while Ambulating = 98%

## 2023-01-07 ENCOUNTER — Ambulatory Visit (HOSPITAL_BASED_OUTPATIENT_CLINIC_OR_DEPARTMENT_OTHER): Payer: Managed Care, Other (non HMO) | Attending: Pulmonary Disease | Admitting: Pulmonary Disease

## 2023-01-07 DIAGNOSIS — G4736 Sleep related hypoventilation in conditions classified elsewhere: Secondary | ICD-10-CM | POA: Insufficient documentation

## 2023-01-07 DIAGNOSIS — G4733 Obstructive sleep apnea (adult) (pediatric): Secondary | ICD-10-CM | POA: Insufficient documentation

## 2023-01-07 DIAGNOSIS — R0683 Snoring: Secondary | ICD-10-CM

## 2023-01-24 ENCOUNTER — Telehealth: Payer: Self-pay | Admitting: Pulmonary Disease

## 2023-01-24 DIAGNOSIS — R0683 Snoring: Secondary | ICD-10-CM | POA: Diagnosis not present

## 2023-01-24 NOTE — Telephone Encounter (Signed)
Call patient  Sleep study result  Date of study: 01/07/2023  Impression: Severe obstructive sleep apnea Very severe oxygen desaturations  Recommendation: DME referral  - Recommend a trial of Bipap 25/21 with 5 Liters of oxygen piped in to system with heated humidification with a [employer] full face large Evora full mask  Schedule for overnight oximetry which system in place  Encourage weight loss measures  Schedule for early follow-up to discuss study, treatment needs to be started soon as possible  Needs close clinical follow-up

## 2023-01-24 NOTE — Procedures (Signed)
POLYSOMNOGRAPHY  Last, FirstShana, Maxwell MRN: [id number] Gender: Male Age (years): 36 Weight (lbs): 325 DOB: 09-May-1986 BMI: 44 Primary Care: No PCP Epworth Score: 17 Referring: Tomma Lightning MD Technician: Lowry Ram Interpreting: Tomma Lightning MD Study Type: BiPAP Ordered Study Type: Split Night CPAP Study date: 01/07/2023 Location: [facility] CLINICAL INFORMATION Ryan Maxwell is a 37 year old Male and was referred to the sleep center for evaluation of G47.33 OSA: Adult and Pediatric (327.23). Indications include OSA.  MEDICATIONS Patient self administered medications include: N/A. Medications administered during study include No sleep medicine administered.  SLEEP STUDY TECHNIQUE The patient underwent an attended overnight polysomnography titration to assess the effects of BIPAP therapy. The following variables were monitored: EEG(C4-A1, C3-A2, O1-A2, O2-A1), EOG, submental and leg EMG, ECG, oxyhemoglobin saturation by pulse oximetry, thoracic and abdominal respiratory effort belts, nasal/oral airflow by pressure sensor, body position sensor and snoring sensor. BIPAP pressure was titrated to eliminate apneas, hypopneas and oxygen desaturation. Hypopneas were scored per AASM definition IB (4% desaturation)  TECHNICIAN COMMENTS Comments added by Technician: None Comments added by Scorer: N/A SLEEP ARCHITECTURE The study was initiated at 10:11:39 PM and terminated at 5:44:25 AM. Total recorded time was 452.8 minutes. EEG confirmed total sleep time was 441 minutes yielding a sleep efficiency of 97.4%. Sleep onset after lights out was 0.9 minutes with a REM latency of 202.5 minutes. The patient spent 3.1% of the night in stage N1 sleep, 85.3% in stage N2 sleep, 0.0% in stage N3 and 11.7% in REM. The Arousal Index was 46.8/hour. RESPIRATORY PARAMETERS There were a total of 375 respiratory disturbances out of which 194 were apneas ( 193 obstructive, 1 mixed, 0  central) and 181 hypopneas. The apnea/hypopnea index (AHI) was 51.0 events/hour. The central sleep apnea index was 0 events/hour. The REM AHI was 16.3 events/hour and NREM AHI was 55.6 events/hour. The supine AHI was 96.2 events/hour and the non supine AHI was 47.1 supine during 7.92% of sleep. Respiratory disturbances were associated with oxygen desaturation down to a nadir of 33% during sleep. The mean oxygen saturation during the study was 71.2%.   LEG MOVEMENT DATA The total leg movements were 17 with a resulting leg movement index of 2.3. Associated arousal with leg movement index was 0.3. CARDIAC DATA The underlying cardiac rhythm was most consistent with sinus rhythm. Mean heart rate during sleep was 98.6 bpm. Additional rhythm abnormalities include None.  IMPRESSIONS - Severe Obstructive Sleep apnea(OSA)  - EKG showed no cardiac abnormalities. - Severe Oxygen Desaturation - The patient snored with moderate snoring volume. - No significant periodic leg movements(PLMs) during sleep. However, no significant associated arousals.  DIAGNOSIS - Obstructive Sleep Apnea (G47.33) - Nocturnal Hypoxemia (G47.36)  RECOMMENDATIONS - Recommend a trial of Bipap 25/21 with 5 Liters of oxygen piped in to system with heated humidification with a [employer] full face large Evora full mask - Overnight oximetry with device in place recommened. - Avoid alcohol, sedatives and other CNS depressants that may worsen sleep apnea and disrupt normal sleep architecture. - Sleep hygiene should be reviewed to assess factors that may improve sleep quality. - Weight management and regular exercise should be initiated or continued. - Cose clinical monitoring recommended - Return to Sleep Center for re-evaluation after 4 weeks of therapy  [Electronically signed] 01/24/2023 06:00 AM  Virl Diamond MD NPI: [id number]

## 2023-01-25 ENCOUNTER — Ambulatory Visit: Payer: Managed Care, Other (non HMO) | Admitting: Family

## 2023-01-28 ENCOUNTER — Other Ambulatory Visit: Payer: Self-pay

## 2023-01-28 DIAGNOSIS — G4733 Obstructive sleep apnea (adult) (pediatric): Secondary | ICD-10-CM

## 2023-01-28 NOTE — Telephone Encounter (Signed)
Spoke with patient regarding sleep study per Dr.Olalere  Sleep study result   Date of study: 01/07/2023   Impression: Severe obstructive sleep apnea Very severe oxygen desaturations   Recommendation: DME referral   - Recommend a trial of Bipap 25/21 with 5 Liters of oxygen piped in to system with heated humidification with a [employer] full face large Evora full mask   Schedule for overnight oximetry which system in place   Encourage weight loss measures   Schedule for early follow-up to discuss study, treatment needs to be started soon as possible   Needs close clinical follow-up  Made a f/u for patient to come and discuss sleep study result's   Order has been placed for BIPAP.Patient is aware and patient's voice was understanding.Nothing else further needed.

## 2023-02-07 ENCOUNTER — Encounter: Payer: Self-pay | Admitting: Pulmonary Disease

## 2023-02-07 ENCOUNTER — Ambulatory Visit: Payer: Managed Care, Other (non HMO) | Admitting: Pulmonary Disease

## 2023-02-07 VITALS — BP 140/90 | HR 97 | Ht 72.0 in | Wt 343.0 lb

## 2023-02-07 DIAGNOSIS — G4733 Obstructive sleep apnea (adult) (pediatric): Secondary | ICD-10-CM | POA: Diagnosis not present

## 2023-02-07 DIAGNOSIS — R9389 Abnormal findings on diagnostic imaging of other specified body structures: Secondary | ICD-10-CM | POA: Diagnosis not present

## 2023-02-07 NOTE — Addendum Note (Signed)
Addended by: Lanna Poche on: 02/07/2023 11:04 AM   Modules accepted: Orders

## 2023-02-07 NOTE — Patient Instructions (Signed)
BiPAP 25/21 with oxygen at 5 L  Overnight oximetry to be done about 1 to 2 weeks after he is set up on the BiPAP  Call us with any significant concerns with the pressure settings if it is not well-tolerated  Repeat CT scan of the chest in 6 months  I will see you back in the office in about 3 months  Make sure you call us if you are having any significant problems with the BiPAP with oxygen added to it

## 2023-02-07 NOTE — Progress Notes (Signed)
Ryan Maxwell    [id number]    October 30, 1985  Primary Care Physician:Pcp, No  Referring Physician: No referring provider defined for this encounter.  Chief complaint:   Patient with snoring, witnessed apneas  HPI:  Patient found to have severe obstructive sleep apnea, titrated to BiPAP of 25/21 with 5 L of oxygen  Has been doing well since her last visit  Weight is coming down  Feels relatively well  Has been off cigarettes for about 20 half months  He does have a trilogy ventilator at home at present which he uses nightly -Feels it is beneficial Tolerates a fullface mask better Bedtime varies Falls asleep immediately 3-4 awakenings Works third shift  Significant weight gain over the last couple years but is on the trend of losing weight at present  He is tired all the time, sleepy all the time Admits to headaches in the morning Admits to dryness of his mouth in the morning  Mom snored  Has a history of hypertension that is well-controlled   Outpatient Encounter Medications as of 02/07/2023  Medication Sig   albuterol (VENTOLIN HFA) 108 (90 Base) MCG/ACT inhaler Inhale 2 puffs into the lungs every 6 (six) hours as needed for wheezing or shortness of breath.   amLODipine (NORVASC) 10 MG tablet Take 1 tablet (10 mg total) by mouth daily.   Blood Glucose Monitoring Suppl DEVI 1 each by Does not apply route in the morning, at noon, and at bedtime. May substitute to any manufacturer covered by patient's insurance.   Glucose Blood (BLOOD GLUCOSE TEST STRIPS) STRP 1 each by In Vitro route in the morning, at noon, and at bedtime. May substitute to any manufacturer covered by patient's insurance.   hydrochlorothiazide (HYDRODIURIL) 12.5 MG tablet Take 1 tablet (12.5 mg total) by mouth daily.   lisinopril (ZESTRIL) 40 MG tablet Take 1 tablet (40 mg total) by mouth daily.   Multiple Vitamin (MULTIVITAMIN WITH MINERALS) TABS tablet Take 1 tablet by mouth daily with  lunch.   nicotine (NICODERM CQ - DOSED IN MG/24 HOURS) 21 mg/24hr patch One patch chest wall daily   metFORMIN (GLUCOPHAGE-XR) 750 MG 24 hr tablet Take 1 tablet (750 mg total) by mouth daily with breakfast. (Patient not taking: Reported on 02/07/2023)   No facility-administered encounter medications on file as of 02/07/2023.    Allergies as of 02/07/2023 - Review Complete 02/07/2023  Allergen Reaction Noted   Dayquil [pseudoephedrine-apap-dm] Shortness Of Breath 11/09/2015   Nyquil multi-symptom [pseudoeph-doxylamine-dm-apap] Shortness Of Breath 09/03/2016    Past Medical History:  Diagnosis Date   Acute respiratory failure with hypoxia (HCC) 03/06/2020   Bronchitis    Hypertension    Pneumonia    Tobacco abuse     No past surgical history on file.  Family History  Problem Relation Age of Onset   Hypertension Mother    Hypothyroidism Mother    Hypertension Other     Social History   Socioeconomic History   Marital status: Single    Spouse name: Not on file   Number of children: Not on file   Years of education: Not on file   Highest education level: Not on file  Occupational History   Not on file  Tobacco Use   Smoking status: Every Day    Packs/day: 1.00    Years: 16.00    Additional pack years: 0.00    Total pack years: 16.00    Types: Cigarettes  Smokeless tobacco: Never  Vaping Use   Vaping Use: Never used  Substance and Sexual Activity   Alcohol use: Not Currently   Drug use: Yes    Types: Marijuana   Sexual activity: Yes  Other Topics Concern   Not on file  Social History Narrative   Not on file   Social Determinants of Health   Financial Resource Strain: Not on file  Food Insecurity: No Food Insecurity (07/03/2022)   Hunger Vital Sign    Worried About Running Out of Food in the Last Year: Never true    Ran Out of Food in the Last Year: Never true  Transportation Needs: No Transportation Needs (07/03/2022)   PRAPARE - Scientist, research (physical sciences) (Medical): No    Lack of Transportation (Non-Medical): No  Physical Activity: Not on file  Stress: Not on file  Social Connections: Not on file  Intimate Partner Violence: Not At Risk (07/03/2022)   Humiliation, Afraid, Rape, and Kick questionnaire    Fear of Current or Ex-Partner: No    Emotionally Abused: No    Physically Abused: No    Sexually Abused: No    Review of Systems  Respiratory:  Positive for apnea.   Psychiatric/Behavioral:  Positive for sleep disturbance.     Vitals:   02/07/23 0912  BP: (!) 140/90  Pulse: 97  SpO2: 91%     Physical Exam Constitutional:      Appearance: He is obese.  HENT:     Head: Normocephalic.     Nose: Nose normal.     Mouth/Throat:     Mouth: Mucous membranes are moist.     Comments: Mallampati 3, crowded oropharynx Eyes:     Pupils: Pupils are equal, round, and reactive to light.  Cardiovascular:     Rate and Rhythm: Normal rate and regular rhythm.     Heart sounds: No murmur heard.    No friction rub.  Pulmonary:     Effort: No respiratory distress.     Breath sounds: No stridor. No wheezing or rhonchi.  Musculoskeletal:     Cervical back: No rigidity or tenderness.  Neurological:     Mental Status: He is alert.  Psychiatric:        Mood and Affect: Mood normal.       09/13/2022   10:00 AM  Results of the Epworth flowsheet  Sitting and reading 3  Watching TV 3  Sitting, inactive in a public place (e.g. a theatre or a meeting) 3  As a passenger in a car for an hour without a break 3  Lying down to rest in the afternoon when circumstances permit 3  Sitting and talking to someone 3  Sitting quietly after a lunch without alcohol 3  In a car, while stopped for a few minutes in traffic 1  Total score 22     Data Reviewed: Recent sleep study reviewed showing severe obstructive sleep apnea Requires BiPAP 25/21 with heated implication, 5 L of oxygen piped into the system  Assessment:  Severe  obstructive sleep apnea-needs BiPAP therapy  Class III obesity  Abnormal CT scan showing multiple nodules, reactive adenopathy -Repeat CT shows some improvement -With his history of significant smoking, needs follow-up in about 6 months  Excessive daytime sleepiness -This is reportedly better with BiPAP use  Reformed smoker Plan/Recommendations:  BiPAP 25/21 with heated humidification with 5 L of oxygen piped into Cystem[employer]  Needs an oximetry with device in place  Needs follow-up CT scan of the chest in 6 months  Tentative follow-up in about 3 months  Encouraged to continue weight loss efforts  Optimize blood pressure management  Risk of decompensation on harm if it does not continue to use BiPAP  He has continued to work on weight loss and has been losing some weight  Virl Diamond MD [facility] 02/07/2023, 9:17 AM  CC: No ref. provider found

## 2023-02-08 ENCOUNTER — Ambulatory Visit (INDEPENDENT_AMBULATORY_CARE_PROVIDER_SITE_OTHER): Payer: Managed Care, Other (non HMO) | Admitting: Family

## 2023-02-08 ENCOUNTER — Encounter: Payer: Self-pay | Admitting: Family

## 2023-02-08 VITALS — BP 138/72 | HR 92 | Ht 72.0 in | Wt 342.0 lb

## 2023-02-08 DIAGNOSIS — E782 Mixed hyperlipidemia: Secondary | ICD-10-CM | POA: Diagnosis not present

## 2023-02-08 DIAGNOSIS — R5383 Other fatigue: Secondary | ICD-10-CM

## 2023-02-08 DIAGNOSIS — I10 Essential (primary) hypertension: Secondary | ICD-10-CM

## 2023-02-08 DIAGNOSIS — E538 Deficiency of other specified B group vitamins: Secondary | ICD-10-CM | POA: Diagnosis not present

## 2023-02-08 DIAGNOSIS — E559 Vitamin D deficiency, unspecified: Secondary | ICD-10-CM

## 2023-02-08 DIAGNOSIS — R7303 Prediabetes: Secondary | ICD-10-CM

## 2023-02-08 NOTE — Progress Notes (Signed)
Sleep Questionnaire: 7/10

## 2023-02-08 NOTE — Progress Notes (Unsigned)
New Patient Office Visit  Subjective    Patient ID: Ryan Maxwell, male    DOB: 1985-10-22  Age: 37 y.o. MRN: [id number]  CC:  Chief Complaint  Patient presents with   Establish Care    New Patient    HPI Ryan Maxwell presents to establish car  he does have additional concerns to discuss today.   Shortness of Breath This is a chronic problem. The current episode started more than 1 month ago. The problem occurs intermittently. The problem has been waxing and waning. Associated symptoms include wheezing. He has tried body position changes, oral steroids, rest and steroid inhalers for the symptoms. The treatment provided mild relief. His past medical history is significant for pneumonia. There is no history of asthma.  Allergic Reaction This is a new problem. The current episode started more than 1 week ago. The problem occurs constantly. The problem has been gradually improving since onset. The problem is moderate. Associated with: metformin, amlodipine, HCTZ. The time of exposure was separated in time from the onset of symptoms. The exposure occurred at Home. Associated symptoms include wheezing. Swelling is present on the feet (legs). Treatments tried: increased water intake, increased exercise. The treatment provided no relief. There is no history of asthma, atopic dermatitis, food allergies, medication allergies or seasonal allergies.    Outpatient Encounter Medications as of 02/08/2023  Medication Sig   lisinopril (ZESTRIL) 40 MG tablet Take 1 tablet (40 mg total) by mouth daily.   albuterol (VENTOLIN HFA) 108 (90 Base) MCG/ACT inhaler Inhale 2 puffs into the lungs every 6 (six) hours as needed for wheezing or shortness of breath.   amLODipine (NORVASC) 10 MG tablet Take 1 tablet (10 mg total) by mouth daily.   Blood Glucose Monitoring Suppl DEVI 1 each by Does not apply route in the morning, at noon, and at bedtime. May substitute to any manufacturer covered by patient's  insurance.   Glucose Blood (BLOOD GLUCOSE TEST STRIPS) STRP 1 each by In Vitro route in the morning, at noon, and at bedtime. May substitute to any manufacturer covered by patient's insurance.   hydrochlorothiazide (HYDRODIURIL) 12.5 MG tablet Take 1 tablet (12.5 mg total) by mouth daily.   Multiple Vitamin (MULTIVITAMIN WITH MINERALS) TABS tablet Take 1 tablet by mouth daily with lunch.   nicotine (NICODERM CQ - DOSED IN MG/24 HOURS) 21 mg/24hr patch One patch chest wall daily   [DISCONTINUED] metFORMIN (GLUCOPHAGE-XR) 750 MG 24 hr tablet Take 1 tablet (750 mg total) by mouth daily with breakfast. (Patient not taking: Reported on 02/08/2023)   No facility-administered encounter medications on file as of 02/08/2023.    Past Medical History:  Diagnosis Date   Acute respiratory failure with hypoxia (HCC) 03/06/2020   Bronchitis    Hypertension    Pneumonia    Tobacco abuse     History reviewed. No pertinent surgical history.  Family History  Problem Relation Age of Onset   Hypertension Mother    Hypothyroidism Mother    Hypertension Other     Social History   Socioeconomic History   Marital status: Single    Spouse name: Not on file   Number of children: Not on file   Years of education: Not on file   Highest education level: Not on file  Occupational History   Not on file  Tobacco Use   Smoking status: Former    Packs/day: 1.00    Years: 16.00    Additional pack years: 0.00  Total pack years: 16.00    Types: Cigarettes    Quit date: 12/2022    Years since quitting: 0.1   Smokeless tobacco: Never  Vaping Use   Vaping Use: Never used  Substance and Sexual Activity   Alcohol use: Not Currently   Drug use: Yes    Types: Marijuana   Sexual activity: Yes  Other Topics Concern   Not on file  Social History Narrative   Not on file   Social Determinants of Health   Financial Resource Strain: Not on file  Food Insecurity: No Food Insecurity (07/03/2022)   Hunger  Vital Sign    Worried About Running Out of Food in the Last Year: Never true    Ran Out of Food in the Last Year: Never true  Transportation Needs: No Transportation Needs (07/03/2022)   PRAPARE - Administrator, Civil Service (Medical): No    Lack of Transportation (Non-Medical): No  Physical Activity: Not on file  Stress: Not on file  Social Connections: Not on file  Intimate Partner Violence: Not At Risk (07/03/2022)   Humiliation, Afraid, Rape, and Kick questionnaire    Fear of Current or Ex-Partner: No    Emotionally Abused: No    Physically Abused: No    Sexually Abused: No    Review of Systems  Respiratory:  Positive for shortness of breath and wheezing.         Objective    BP 138/72   Pulse 92   Ht 6' (1.829 m)   Wt (!) 342 lb (155.1 kg)   SpO2 91%   BMI 46.38 kg/m   Physical Exam     Assessment & Plan:   Problem List Items Addressed This Visit       Cardiovascular and Mediastinum   Essential hypertension - Primary   Relevant Orders   CBC With Differential   CMP14+EGFR     Other   Obesity, Class III, BMI 40-49.9 (morbid obesity) (HCC)   Relevant Orders   CBC With Differential   CMP14+EGFR   Other Visit Diagnoses     Mixed hyperlipidemia       Relevant Orders   Lipid panel   CBC With Differential   CMP14+EGFR   B12 deficiency due to diet       Relevant Orders   CBC With Differential   CMP14+EGFR   Vitamin B12   Prediabetes       Relevant Orders   CBC With Differential   CMP14+EGFR   Hemoglobin A1c   Vitamin D deficiency, unspecified       Relevant Orders   VITAMIN D 25 Hydroxy (Vit-D Deficiency, Fractures)   CBC With Differential   CMP14+EGFR   Other fatigue       Relevant Orders   CBC With Differential   CMP14+EGFR   TSH       Return in about 2 weeks (around 02/22/2023) for F/U.   Total time spent: 45 minutes  Miki Kins, FNP  02/08/2023

## 2023-02-08 NOTE — Patient Instructions (Signed)
For right now:   Keep taking lisinopril Hold Amlodipine for 1 week.  Increase Hydrochlorothiazide - take 2 of what you have right now, I will send in the new dose.   Can restart amlodipine after 1 week - take 1/2 of a tablet.

## 2023-02-09 LAB — CMP14+EGFR
ALT: 28 IU/L (ref 0–44)
AST: 20 IU/L (ref 0–40)
Albumin/Globulin Ratio: 1.2 (ref 1.2–2.2)
Albumin: 3.9 g/dL — ABNORMAL LOW (ref 4.1–5.1)
Alkaline Phosphatase: 78 IU/L (ref 44–121)
BUN/Creatinine Ratio: 13 (ref 9–20)
BUN: 11 mg/dL (ref 6–20)
Bilirubin Total: 0.4 mg/dL (ref 0.0–1.2)
CO2: 27 mmol/L (ref 20–29)
Calcium: 9.1 mg/dL (ref 8.7–10.2)
Chloride: 103 mmol/L (ref 96–106)
Creatinine, Ser: 0.82 mg/dL (ref 0.76–1.27)
Globulin, Total: 3.2 g/dL (ref 1.5–4.5)
Glucose: 106 mg/dL — ABNORMAL HIGH (ref 70–99)
Potassium: 4.3 mmol/L (ref 3.5–5.2)
Sodium: 144 mmol/L (ref 134–144)
Total Protein: 7.1 g/dL (ref 6.0–8.5)
eGFR: 117 mL/min/{1.73_m2} (ref >59)

## 2023-02-09 LAB — CBC WITH DIFFERENTIAL
Basophils Absolute: 0 10*3/uL (ref 0.0–0.2)
Basos: 1 %
EOS (ABSOLUTE): 0.1 10*3/uL (ref 0.0–0.4)
Eos: 2 %
Hematocrit: 46 % (ref 37.5–51.0)
Hemoglobin: 14.2 g/dL (ref 13.0–17.7)
Immature Grans (Abs): 0 10*3/uL (ref 0.0–0.1)
Immature Granulocytes: 1 %
Lymphocytes Absolute: 1.9 10*3/uL (ref 0.7–3.1)
Lymphs: 32 %
MCH: 26.8 pg (ref 26.6–33.0)
MCHC: 30.9 g/dL — ABNORMAL LOW (ref 31.5–35.7)
MCV: 87 fL (ref 79–97)
Monocytes Absolute: 1 10*3/uL — ABNORMAL HIGH (ref 0.1–0.9)
Monocytes: 17 %
Neutrophils Absolute: 2.8 10*3/uL (ref 1.4–7.0)
Neutrophils: 47 %
RBC: 5.3 x10E6/uL (ref 4.14–5.80)
RDW: 13.1 % (ref 11.6–15.4)
WBC: 5.9 10*3/uL (ref 3.4–10.8)

## 2023-02-09 LAB — LIPID PANEL
Chol/HDL Ratio: 4.3 ratio (ref 0.0–5.0)
Cholesterol, Total: 152 mg/dL (ref 100–199)
HDL: 35 mg/dL — ABNORMAL LOW (ref >39)
LDL Chol Calc (NIH): 84 mg/dL (ref 0–99)
Triglycerides: 193 mg/dL — ABNORMAL HIGH (ref 0–149)
VLDL Cholesterol Cal: 33 mg/dL (ref 5–40)

## 2023-02-09 LAB — VITAMIN D 25 HYDROXY (VIT D DEFICIENCY, FRACTURES): Vit D, 25-Hydroxy: 7.4 ng/mL — ABNORMAL LOW (ref 30.0–100.0)

## 2023-02-09 LAB — VITAMIN B12: Vitamin B-12: 514 pg/mL (ref 232–1245)

## 2023-02-09 LAB — HEMOGLOBIN A1C
Est. average glucose Bld gHb Est-mCnc: 137 mg/dL
Hgb A1c MFr Bld: 6.4 % — ABNORMAL HIGH (ref 4.8–5.6)

## 2023-02-09 LAB — TSH: TSH: 2.86 u[IU]/mL (ref 0.450–4.500)

## 2023-02-22 ENCOUNTER — Ambulatory Visit (INDEPENDENT_AMBULATORY_CARE_PROVIDER_SITE_OTHER): Payer: Managed Care, Other (non HMO) | Admitting: Family

## 2023-02-22 ENCOUNTER — Encounter: Payer: Self-pay | Admitting: Family

## 2023-02-22 VITALS — BP 150/100 | HR 84 | Ht 72.0 in | Wt 345.6 lb

## 2023-02-22 DIAGNOSIS — E559 Vitamin D deficiency, unspecified: Secondary | ICD-10-CM | POA: Diagnosis not present

## 2023-02-22 DIAGNOSIS — I10 Essential (primary) hypertension: Secondary | ICD-10-CM

## 2023-02-22 DIAGNOSIS — R5383 Other fatigue: Secondary | ICD-10-CM | POA: Diagnosis not present

## 2023-02-22 DIAGNOSIS — E1165 Type 2 diabetes mellitus with hyperglycemia: Secondary | ICD-10-CM

## 2023-02-22 DIAGNOSIS — G4733 Obstructive sleep apnea (adult) (pediatric): Secondary | ICD-10-CM

## 2023-02-22 MED ORDER — HYDROCHLOROTHIAZIDE 25 MG PO TABS: 25.0000 mg | ORAL_TABLET | Freq: Every day | ORAL | 1 refills | Status: DC

## 2023-02-22 MED ORDER — SEMAGLUTIDE(0.25 OR 0.5MG/DOS) 2 MG/3ML ~~LOC~~ SOPN: PEN_INJECTOR | SUBCUTANEOUS | 0 refills | Status: DC

## 2023-02-22 MED ORDER — VITAMIN D (ERGOCALCIFEROL) 1.25 MG (50000 UNIT) PO CAPS: 50000.0000 [IU] | ORAL_CAPSULE | ORAL | 1 refills | Status: DC

## 2023-02-23 ENCOUNTER — Encounter: Payer: Self-pay | Admitting: Family

## 2023-02-23 DIAGNOSIS — E1165 Type 2 diabetes mellitus with hyperglycemia: Secondary | ICD-10-CM | POA: Insufficient documentation

## 2023-02-23 DIAGNOSIS — E559 Vitamin D deficiency, unspecified: Secondary | ICD-10-CM | POA: Insufficient documentation

## 2023-02-23 NOTE — Assessment & Plan Note (Signed)
Blood pressure has been controlled with current medications when taken at home after meds.  Continue current therapy.  Will reassess at follow up.   

## 2023-02-23 NOTE — Assessment & Plan Note (Signed)
Patient has Sleep Study order already placed from the pulmonologist.

## 2023-02-23 NOTE — Assessment & Plan Note (Deleted)
Blood pressure has been controlled with current medications when taken at home after meds.  Continue current therapy.  Will reassess at follow up.

## 2023-02-23 NOTE — Assessment & Plan Note (Signed)
Continue current meds.  Will adjust as needed based on results.  The patient is asked to make an attempt to improve diet and exercise patterns to aid in medical management of this problem. Addressed importance of increasing and maintaining water intake.   

## 2023-02-23 NOTE — Assessment & Plan Note (Signed)
Adding Ozempic to POC.  Patient educated on use of pen.   Continue current other diabetes meds, as patient has been well controlled on current regimen.  Will adjust meds if needed based on labs.

## 2023-02-23 NOTE — Progress Notes (Signed)
Established Patient Office Visit  Subjective:  Patient ID: Ryan Maxwell, male    DOB: [birth year]  Age: 37 y.o. MRN: [id number]  Chief Complaint  Patient presents with   Follow-up    2 week follow up    Pt. Here today for 2 week n/p follow up.   Had labs done at that time, so we will review in detail today.   Labs:  A1C is quite elevated still, at 6.4.  Vitamin D was also very low.  Triglycerides were elevated.   His blood pressure is elevated today, but he says that he did not take his medication yet today. He also says he has run out of his HCTZ since we previously told him to take 2 of these.   Finally, he asks if there is something we can do to help him lose weight.   No other concerns at this time.   Past Medical History:  Diagnosis Date   Acute bronchitis 11/21/2021   Acute on chronic respiratory failure with hypoxia (HCC) 12/12/2022   Acute respiratory failure due to COVID-19 [facility]) 12/12/2022   Acute respiratory failure with hypoxia (HCC) 03/06/2020   Acute respiratory failure with hypoxia and hypercapnia (HCC) 03/06/2020   Bronchitis    CAP (community acquired pneumonia) 04/22/2022   COVID-19 virus infection 03/06/2020   Diarrhea 07/03/2022   Hypertension    Lower respiratory tract infection due to COVID-19 virus 07/25/2021   Pneumonia    Tobacco abuse     History reviewed. No pertinent surgical history.  Social History   Socioeconomic History   Marital status: Single    Spouse name: Not on file   Number of children: Not on file   Years of education: Not on file   Highest education level: Not on file  Occupational History   Not on file  Tobacco Use   Smoking status: Former    Packs/day: 1.00    Years: 16.00    Additional pack years: 0.00    Total pack years: 16.00    Types: Cigarettes    Quit date: 12/2022    Years since quitting: 0.2   Smokeless tobacco: Never  Vaping Use   Vaping Use: Never used  Substance and Sexual Activity    Alcohol use: Not Currently   Drug use: Yes    Types: Marijuana   Sexual activity: Yes  Other Topics Concern   Not on file  Social History Narrative   Not on file   Social Determinants of Health   Financial Resource Strain: Not on file  Food Insecurity: No Food Insecurity (07/03/2022)   Hunger Vital Sign    Worried About Running Out of Food in the Last Year: Never true    Ran Out of Food in the Last Year: Never true  Transportation Needs: No Transportation Needs (07/03/2022)   PRAPARE - Administrator, Civil Service (Medical): No    Lack of Transportation (Non-Medical): No  Physical Activity: Not on file  Stress: Not on file  Social Connections: Not on file  Intimate Partner Violence: Not At Risk (07/03/2022)   Humiliation, Afraid, Rape, and Kick questionnaire    Fear of Current or Ex-Partner: No    Emotionally Abused: No    Physically Abused: No    Sexually Abused: No    Family History  Problem Relation Age of Onset   Hypertension Mother    Hypothyroidism Mother    Hypertension Other     Allergies  Allergen Reactions  Dayquil [Pseudoephedrine-Apap-Dm] Shortness Of Breath   Nyquil Multi-Symptom [Pseudoeph-Doxylamine-Dm-Apap] Shortness Of Breath   Metformin And Related Other (See Comments)    Swelling in bilateral legs, severe, causing mobility issues.     Review of Systems  All other systems reviewed and are negative.      Objective:   BP (!) 150/100   Pulse 84   Ht 6' (1.829 m)   Wt (!) 345 lb 9.6 oz (156.8 kg)   SpO2 (!) 89%   BMI 46.87 kg/m   Vitals:   02/22/23 1108  BP: (!) 150/100  Pulse: 84  Height: 6' (1.829 m)  Weight: (!) 345 lb 9.6 oz (156.8 kg)  SpO2: (!) 89%  BMI (Calculated): 46.86    Physical Exam Vitals and nursing note reviewed.  Constitutional:      Appearance: Normal appearance. He is obese.  Eyes:     Extraocular Movements: Extraocular movements intact.     Conjunctiva/sclera: Conjunctivae normal.     Pupils:  Pupils are equal, round, and reactive to light.  Cardiovascular:     Rate and Rhythm: Normal rate and regular rhythm.     Pulses: Normal pulses.     Heart sounds: Normal heart sounds.  Pulmonary:     Effort: Pulmonary effort is normal.     Breath sounds: Normal breath sounds.  Neurological:     General: No focal deficit present.     Mental Status: He is alert and oriented to person, place, and time.  Psychiatric:        Mood and Affect: Mood normal.        Behavior: Behavior normal.        Thought Content: Thought content normal.        Judgment: Judgment normal.      No results found for any visits on 02/22/23.  Recent Results (from the past 2160 hour(s))  Troponin I (High Sensitivity)     Status: None   Collection Time: 12/11/22  8:01 AM  Result Value Ref Range   Troponin I (High Sensitivity) 17 <18 ng/L    Comment: (NOTE) Elevated high sensitivity troponin I (hsTnI) values and significant  changes across serial measurements may suggest ACS but many other  chronic and acute conditions are known to elevate hsTnI results.  Refer to the "Links" section for chest pain algorithms and additional  guidance. Performed at [facility], [address] [place], [place] [address]   Resp panel by RT-PCR (RSV, Flu A&B, Covid) Anterior Nasal Swab     Status: None   Collection Time: 12/11/22  8:01 AM   Specimen: Anterior Nasal Swab  Result Value Ref Range   SARS Coronavirus 2 by RT PCR NEGATIVE NEGATIVE    Comment: (NOTE) SARS-CoV-2 target nucleic acids are NOT DETECTED.  The SARS-CoV-2 RNA is generally detectable in upper respiratory specimens during the acute phase of infection. The lowest concentration of SARS-CoV-2 viral copies this assay can detect is 138 copies/mL. A negative result does not preclude SARS-Cov-2 infection and should not be used as the sole basis for treatment or other patient management decisions. A negative result may occur with  improper specimen  collection/handling, submission of specimen other than nasopharyngeal swab, presence of viral mutation(s) within the areas targeted by this assay, and inadequate number of viral copies(<138 copies/mL). A negative result must be combined with clinical observations, patient history, and epidemiological information. The expected result is Negative.  Fact Sheet for Patients:  BloggerCourse.com  Fact Sheet for Healthcare  Providers:  SeriousBroker.it  This test is no t yet approved or cleared by the [place] and  has been authorized for detection and/or diagnosis of SARS-CoV-2 by FDA under an Emergency Use Authorization (EUA). This EUA will remain  in effect (meaning this test can be used) for the duration of the COVID-19 declaration under Section 564(b)(1) of the Act, 21 U.S.C.section 360bbb-3(b)(1), unless the authorization is terminated  or revoked sooner.       Influenza A by PCR NEGATIVE NEGATIVE   Influenza B by PCR NEGATIVE NEGATIVE    Comment: (NOTE) The Xpert Xpress SARS-CoV-2/FLU/RSV plus assay is intended as an aid in the diagnosis of influenza from Nasopharyngeal swab specimens and should not be used as a sole basis for treatment. Nasal washings and aspirates are unacceptable for Xpert Xpress SARS-CoV-2/FLU/RSV testing.  Fact Sheet for Patients: BloggerCourse.com  Fact Sheet for Healthcare Providers: SeriousBroker.it  This test is not yet approved or cleared by the [place] FDA and has been authorized for detection and/or diagnosis of SARS-CoV-2 by FDA under an Emergency Use Authorization (EUA). This EUA will remain in effect (meaning this test can be used) for the duration of the COVID-19 declaration under Section 564(b)(1) of the Act, 21 U.S.C. section 360bbb-3(b)(1), unless the authorization is terminated or revoked.     Resp Syncytial Virus by PCR  NEGATIVE NEGATIVE    Comment: (NOTE) Fact Sheet for Patients: BloggerCourse.com  Fact Sheet for Healthcare Providers: SeriousBroker.it  This test is not yet approved or cleared by the [place] FDA and has been authorized for detection and/or diagnosis of SARS-CoV-2 by FDA under an Emergency Use Authorization (EUA). This EUA will remain in effect (meaning this test can be used) for the duration of the COVID-19 declaration under Section 564(b)(1) of the Act, 21 U.S.C. section 360bbb-3(b)(1), unless the authorization is terminated or revoked.  Performed at [facility], [address] [place], [place] [address]   D-dimer, quantitative     Status: None   Collection Time: 12/11/22  8:01 AM  Result Value Ref Range   D-Dimer, Quant 0.48 0.00 - 0.50 ug/mL-FEU    Comment: (NOTE) At the manufacturer cut-off value of 0.5 g/mL FEU, this assay has a negative predictive value of 95-100%.This assay is intended for use in conjunction with a clinical pretest probability (PTP) assessment model to exclude pulmonary embolism (PE) and deep venous thrombosis (DVT) in outpatients suspected of PE or DVT. Results should be correlated with clinical presentation. Performed at [facility], [address] [place], [place] [address]   Procalcitonin     Status: None   Collection Time: 12/11/22  8:01 AM  Result Value Ref Range   Procalcitonin <0.10 ng/mL    Comment:        Interpretation: PCT (Procalcitonin) <= 0.5 ng/mL: Systemic infection (sepsis) is not likely. Local bacterial infection is possible. (NOTE)       Sepsis PCT Algorithm           Lower Respiratory Tract                                      Infection PCT Algorithm    ----------------------------     ----------------------------         PCT < 0.25 ng/mL                PCT < 0.10 ng/mL  Strongly encourage             Strongly discourage    discontinuation of antibiotics    initiation of antibiotics    ----------------------------     -----------------------------       PCT 0.25 - 0.50 ng/mL            PCT 0.10 - 0.25 ng/mL               OR       >80% decrease in PCT            Discourage initiation of                                            antibiotics      Encourage discontinuation           of antibiotics    ----------------------------     -----------------------------         PCT >= 0.50 ng/mL              PCT 0.26 - 0.50 ng/mL               AND        <80% decrease in PCT             Encourage initiation of                                             antibiotics       Encourage continuation           of antibiotics    ----------------------------     -----------------------------        PCT >= 0.50 ng/mL                  PCT > 0.50 ng/mL               AND         increase in PCT                  Strongly encourage                                      initiation of antibiotics    Strongly encourage escalation           of antibiotics                                     -----------------------------                                           PCT <= 0.25 ng/mL                                                 OR                                        >  80% decrease in PCT                                      Discontinue / Do not initiate                                             antibiotics  Performed at [facility], [address] [place], [place] [address]   CBC with Differential     Status: Abnormal   Collection Time: 12/11/22  8:01 AM  Result Value Ref Range   WBC 8.1 4.0 - 10.5 K/uL   RBC 5.85 (H) 4.22 - 5.81 MIL/uL   Hemoglobin 15.7 13.0 - 17.0 g/dL   HCT 60.4 (H) 54.0 - 98.1 %   MCV 91.5 80.0 - 100.0 fL   MCH 26.8 26.0 - 34.0 pg   MCHC 29.3 (L) 30.0 - 36.0 g/dL   RDW 19.1 47.8 - 29.5 %   Platelets 231 150 - 400 K/uL   nRBC 0.0 0.0 - 0.2 %   Neutrophils Relative % 57 %   Neutro Abs  4.7 1.7 - 7.7 K/uL   Lymphocytes Relative 27 %   Lymphs Abs 2.2 0.7 - 4.0 K/uL   Monocytes Relative 12 %   Monocytes Absolute 0.9 0.1 - 1.0 K/uL   Eosinophils Relative 1 %   Eosinophils Absolute 0.1 0.0 - 0.5 K/uL   Basophils Relative 1 %   Basophils Absolute 0.1 0.0 - 0.1 K/uL   Immature Granulocytes 2 %   Abs Immature Granulocytes 0.14 (H) 0.00 - 0.07 K/uL    Comment: Performed at [facility], [address] [place], [place] [address]  Comprehensive metabolic panel     Status: Abnormal   Collection Time: 12/11/22  8:01 AM  Result Value Ref Range   Sodium 137 135 - 145 mmol/L   Potassium 4.2 3.5 - 5.1 mmol/L   Chloride 102 98 - 111 mmol/L   CO2 29 22 - 32 mmol/L   Glucose, Bld 159 (H) 70 - 99 mg/dL    Comment: Glucose reference range applies only to samples taken after fasting for at least 8 hours.   BUN 16 6 - 20 mg/dL   Creatinine, Ser 8.65 0.61 - 1.24 mg/dL   Calcium 8.3 (L) 8.9 - 10.3 mg/dL   Total Protein 7.1 6.5 - 8.1 g/dL   Albumin 3.6 3.5 - 5.0 g/dL   AST 34 15 - 41 U/L   ALT 45 (H) 0 - 44 U/L   Alkaline Phosphatase 67 38 - 126 U/L   Total Bilirubin 0.4 0.3 - 1.2 mg/dL   GFR, Estimated >78 >46 mL/min    Comment: (NOTE) Calculated using the CKD-EPI Creatinine Equation (2021)    Anion gap 6 5 - 15    Comment: Performed at [facility], [address] [place], [place] [address]  Brain natriuretic peptide     Status: None   Collection Time: 12/11/22  8:01 AM  Result Value Ref Range   B Natriuretic Peptide 31.2 0.0 - 100.0 pg/mL    Comment: Performed at [facility], [address] [place], [place] [address]  Blood gas, venous     Status: Abnormal   Collection Time: 12/11/22  8:14 AM  Result Value Ref Range  pH, Ven 7.32 7.25 - 7.43   pCO2, Ven 75 (HH) 44 - 60 mmHg   pO2, Ven 49 (H) 32 - 45 mmHg   Bicarbonate 38.6 (H) 20.0 - 28.0 mmol/L   Acid-Base Excess 9.4 (H) 0.0 - 2.0 mmol/L   O2 Saturation 78.5 %   Patient temperature 37.0      Comment: Performed at [facility], [address], [place], [place] [address]  Troponin I (High Sensitivity)     Status: None   Collection Time: 12/11/22  9:46 AM  Result Value Ref Range   Troponin I (High Sensitivity) 15 <18 ng/L    Comment: (NOTE) Elevated high sensitivity troponin I (hsTnI) values and significant  changes across serial measurements may suggest ACS but many other  chronic and acute conditions are known to elevate hsTnI results.  Refer to the "Links" section for chest pain algorithms and additional  guidance. Performed at [facility], [address] [place], [place] [address]   HIV Antibody (routine testing w rflx)     Status: None   Collection Time: 12/11/22  6:54 PM  Result Value Ref Range   HIV Screen 4th Generation wRfx Non Reactive Non Reactive    Comment: Performed at [facility], [address], [place], [place] [address]  Respiratory (~20 pathogens) panel by PCR     Status: None   Collection Time: 12/11/22  6:54 PM   Specimen: Nasopharyngeal Swab; Respiratory  Result Value Ref Range   Adenovirus NOT DETECTED NOT DETECTED   Coronavirus 229E NOT DETECTED NOT DETECTED    Comment: (NOTE) The Coronavirus on the Respiratory Panel, DOES NOT test for the novel  Coronavirus (2019 nCoV)    Coronavirus HKU1 NOT DETECTED NOT DETECTED   Coronavirus NL63 NOT DETECTED NOT DETECTED   Coronavirus OC43 NOT DETECTED NOT DETECTED   Metapneumovirus NOT DETECTED NOT DETECTED   Rhinovirus / Enterovirus NOT DETECTED NOT DETECTED   Influenza A NOT DETECTED NOT DETECTED   Influenza B NOT DETECTED NOT DETECTED   Parainfluenza Virus 1 NOT DETECTED NOT DETECTED   Parainfluenza Virus 2 NOT DETECTED NOT DETECTED   Parainfluenza Virus 3 NOT DETECTED NOT DETECTED   Parainfluenza Virus 4 NOT DETECTED NOT DETECTED   Respiratory Syncytial Virus NOT DETECTED NOT DETECTED   Bordetella pertussis NOT DETECTED NOT DETECTED   Bordetella Parapertussis NOT  DETECTED NOT DETECTED   Chlamydophila pneumoniae NOT DETECTED NOT DETECTED   Mycoplasma pneumoniae NOT DETECTED NOT DETECTED    Comment: Performed at [facility], [address], [place], [place] [address]  Comprehensive metabolic panel     Status: Abnormal   Collection Time: 12/12/22  5:00 AM  Result Value Ref Range   Sodium 141 135 - 145 mmol/L   Potassium 5.2 (H) 3.5 - 5.1 mmol/L   Chloride 103 98 - 111 mmol/L   CO2 32 22 - 32 mmol/L   Glucose, Bld 149 (H) 70 - 99 mg/dL    Comment: Glucose reference range applies only to samples taken after fasting for at least 8 hours.   BUN 16 6 - 20 mg/dL   Creatinine, Ser 2.95 0.61 - 1.24 mg/dL   Calcium 8.7 (L) 8.9 - 10.3 mg/dL   Total Protein 7.3 6.5 - 8.1 g/dL   Albumin 3.5 3.5 - 5.0 g/dL   AST 23 15 - 41 U/L   ALT 37 0 - 44 U/L   Alkaline Phosphatase 67 38 - 126 U/L   Total  Bilirubin 0.5 0.3 - 1.2 mg/dL   GFR, Estimated >16 >10 mL/min    Comment: (NOTE) Calculated using the CKD-EPI Creatinine Equation (2021)    Anion gap 6 5 - 15    Comment: Performed at [facility], [address] [place], [place] [address]  CBC     Status: Abnormal   Collection Time: 12/12/22  5:00 AM  Result Value Ref Range   WBC 16.3 (H) 4.0 - 10.5 K/uL   RBC 5.85 (H) 4.22 - 5.81 MIL/uL   Hemoglobin 15.8 13.0 - 17.0 g/dL   HCT 40.9 (H) 81.1 - 91.4 %   MCV 92.6 80.0 - 100.0 fL   MCH 27.0 26.0 - 34.0 pg   MCHC 29.2 (L) 30.0 - 36.0 g/dL   RDW 78.2 95.6 - 21.3 %   Platelets 256 150 - 400 K/uL   nRBC 0.0 0.0 - 0.2 %    Comment: Performed at [facility], [address] [place], [place] [address]  Hemoglobin A1c     Status: Abnormal   Collection Time: 12/12/22  6:03 AM  Result Value Ref Range   Hgb A1c MFr Bld 6.5 (H) 4.8 - 5.6 %    Comment: (NOTE)         Prediabetes: 5.7 - 6.4         Diabetes: >6.4         Glycemic control for adults with diabetes: <7.0    Mean Plasma Glucose 140 mg/dL    Comment: (NOTE) Performed At: [facility] [address] [place], [place] [id number] Jolene Schimke MD [id number]   Glucose, capillary     Status: Abnormal   Collection Time: 12/12/22  9:43 AM  Result Value Ref Range   Glucose-Capillary 162 (H) 70 - 99 mg/dL    Comment: Glucose reference range applies only to samples taken after fasting for at least 8 hours.  MRSA Next Gen by PCR, Nasal     Status: None   Collection Time: 12/12/22  9:48 AM   Specimen: Nasal Mucosa; Nasal Swab  Result Value Ref Range   MRSA by PCR Next Gen NOT DETECTED NOT DETECTED    Comment: (NOTE) The GeneXpert MRSA Assay (FDA approved for NASAL specimens only), is one component of a comprehensive MRSA colonization surveillance program. It is not intended to diagnose MRSA infection nor to guide or monitor treatment for MRSA infections. Test performance is not FDA approved in patients less than 69 years old. Performed at [facility], [address] [place], [place] [address]   Glucose, capillary     Status: Abnormal   Collection Time: 12/13/22  6:26 PM  Result Value Ref Range   Glucose-Capillary 269 (H) 70 - 99 mg/dL    Comment: Glucose reference range applies only to samples taken after fasting for at least 8 hours.  Glucose, capillary     Status: Abnormal   Collection Time: 12/13/22  9:14 PM  Result Value Ref Range   Glucose-Capillary 253 (H) 70 - 99 mg/dL    Comment: Glucose reference range applies only to samples taken after fasting for at least 8 hours.  Blood gas, arterial     Status: Abnormal   Collection Time: 12/14/22  3:50 AM  Result Value Ref Range   O2 Content 3.0 L/min   Delivery systems NASAL CANNULA    pH, Arterial 7.37 7.35 - 7.45   pCO2 arterial 65 (H) 32 - 48 mmHg   pO2, Arterial 91 83 -  108 mmHg   Bicarbonate 37.6 (H) 20.0 - 28.0 mmol/L   Acid-Base Excess 9.2 (H) 0.0 - 2.0 mmol/L   O2 Saturation 98.5 %   Patient temperature 37.0    Collection site RIGHT RADIAL    Allens test (pass/fail) PASS  PASS    Comment: Performed at [facility], [address] [place], [place] [address]  Glucose, capillary     Status: None   Collection Time: 12/14/22  7:51 AM  Result Value Ref Range   Glucose-Capillary 83 70 - 99 mg/dL    Comment: Glucose reference range applies only to samples taken after fasting for at least 8 hours.  CBC     Status: Abnormal   Collection Time: 12/14/22  9:03 AM  Result Value Ref Range   WBC 12.1 (H) 4.0 - 10.5 K/uL   RBC 5.91 (H) 4.22 - 5.81 MIL/uL   Hemoglobin 15.7 13.0 - 17.0 g/dL   HCT 81.1 (H) 91.4 - 78.2 %   MCV 92.6 80.0 - 100.0 fL   MCH 26.6 26.0 - 34.0 pg   MCHC 28.7 (L) 30.0 - 36.0 g/dL   RDW 95.6 21.3 - 08.6 %   Platelets 234 150 - 400 K/uL   nRBC 0.0 0.0 - 0.2 %    Comment: Performed at [facility], [address], [place], [place] [address]  Basic metabolic panel     Status: Abnormal   Collection Time: 12/14/22  9:03 AM  Result Value Ref Range   Sodium 139 135 - 145 mmol/L   Potassium 3.8 3.5 - 5.1 mmol/L   Chloride 100 98 - 111 mmol/L   CO2 32 22 - 32 mmol/L   Glucose, Bld 99 70 - 99 mg/dL    Comment: Glucose reference range applies only to samples taken after fasting for at least 8 hours.   BUN 16 6 - 20 mg/dL   Creatinine, Ser 9.62 0.61 - 1.24 mg/dL   Calcium 8.6 (L) 8.9 - 10.3 mg/dL   GFR, Estimated >95 >28 mL/min    Comment: (NOTE) Calculated using the CKD-EPI Creatinine Equation (2021)    Anion gap 7 5 - 15    Comment: Performed at [facility], [address] [place], [place] [address]  Hemoglobin A1c     Status: Abnormal   Collection Time: 12/14/22  9:03 AM  Result Value Ref Range   Hgb A1c MFr Bld 6.5 (H) 4.8 - 5.6 %    Comment: (NOTE)         Prediabetes: 5.7 - 6.4         Diabetes: >6.4         Glycemic control for adults with diabetes: <7.0    Mean Plasma Glucose 140 mg/dL    Comment: (NOTE) Performed At: [facility] [address] [place], [place] [id number] Jolene Schimke MD  [id number]   Glucose, capillary     Status: Abnormal   Collection Time: 12/14/22 11:51 AM  Result Value Ref Range   Glucose-Capillary 117 (H) 70 - 99 mg/dL    Comment: Glucose reference range applies only to samples taken after fasting for at least 8 hours.  Glucose, capillary     Status: Abnormal   Collection Time: 12/14/22  3:48 PM  Result Value Ref Range   Glucose-Capillary 203 (H) 70 - 99 mg/dL    Comment: Glucose reference range applies only to samples taken after fasting for at least 8 hours.  Glucose, capillary     Status:  Abnormal   Collection Time: 12/14/22  8:18 PM  Result Value Ref Range   Glucose-Capillary 181 (H) 70 - 99 mg/dL    Comment: Glucose reference range applies only to samples taken after fasting for at least 8 hours.  Glucose, capillary     Status: Abnormal   Collection Time: 12/15/22  5:30 AM  Result Value Ref Range   Glucose-Capillary 115 (H) 70 - 99 mg/dL    Comment: Glucose reference range applies only to samples taken after fasting for at least 8 hours.  Glucose, capillary     Status: Abnormal   Collection Time: 12/15/22 12:02 PM  Result Value Ref Range   Glucose-Capillary 135 (H) 70 - 99 mg/dL    Comment: Glucose reference range applies only to samples taken after fasting for at least 8 hours.  Glucose, capillary     Status: Abnormal   Collection Time: 12/15/22  3:38 PM  Result Value Ref Range   Glucose-Capillary 183 (H) 70 - 99 mg/dL    Comment: Glucose reference range applies only to samples taken after fasting for at least 8 hours.  Glucose, capillary     Status: Abnormal   Collection Time: 12/15/22  8:10 PM  Result Value Ref Range   Glucose-Capillary 213 (H) 70 - 99 mg/dL    Comment: Glucose reference range applies only to samples taken after fasting for at least 8 hours.  Glucose, capillary     Status: Abnormal   Collection Time: 12/15/22  9:19 PM  Result Value Ref Range   Glucose-Capillary 184 (H) 70 - 99 mg/dL    Comment: Glucose  reference range applies only to samples taken after fasting for at least 8 hours.  Glucose, capillary     Status: Abnormal   Collection Time: 12/16/22  8:17 AM  Result Value Ref Range   Glucose-Capillary 111 (H) 70 - 99 mg/dL    Comment: Glucose reference range applies only to samples taken after fasting for at least 8 hours.  Glucose, capillary     Status: Abnormal   Collection Time: 12/16/22 11:55 AM  Result Value Ref Range   Glucose-Capillary 110 (H) 70 - 99 mg/dL    Comment: Glucose reference range applies only to samples taken after fasting for at least 8 hours.  Glucose, capillary     Status: Abnormal   Collection Time: 12/16/22  4:19 PM  Result Value Ref Range   Glucose-Capillary 123 (H) 70 - 99 mg/dL    Comment: Glucose reference range applies only to samples taken after fasting for at least 8 hours.  Glucose, capillary     Status: Abnormal   Collection Time: 12/16/22  8:20 PM  Result Value Ref Range   Glucose-Capillary 153 (H) 70 - 99 mg/dL    Comment: Glucose reference range applies only to samples taken after fasting for at least 8 hours.  Glucose, capillary     Status: Abnormal   Collection Time: 12/17/22  8:13 AM  Result Value Ref Range   Glucose-Capillary 108 (H) 70 - 99 mg/dL    Comment: Glucose reference range applies only to samples taken after fasting for at least 8 hours.  Glucose, capillary     Status: Abnormal   Collection Time: 12/17/22 11:42 AM  Result Value Ref Range   Glucose-Capillary 179 (H) 70 - 99 mg/dL    Comment: Glucose reference range applies only to samples taken after fasting for at least 8 hours.  Glucose, capillary     Status: Abnormal  Collection Time: 12/17/22  4:41 PM  Result Value Ref Range   Glucose-Capillary 169 (H) 70 - 99 mg/dL    Comment: Glucose reference range applies only to samples taken after fasting for at least 8 hours.  Glucose, capillary     Status: Abnormal   Collection Time: 12/17/22  9:01 PM  Result Value Ref Range    Glucose-Capillary 118 (H) 70 - 99 mg/dL    Comment: Glucose reference range applies only to samples taken after fasting for at least 8 hours.  Creatinine, serum     Status: None   Collection Time: 12/18/22  5:02 AM  Result Value Ref Range   Creatinine, Ser 0.74 0.61 - 1.24 mg/dL   GFR, Estimated >40 >98 mL/min    Comment: (NOTE) Calculated using the CKD-EPI Creatinine Equation (2021) Performed at [facility], [address] [place], [place] [address]   Glucose, capillary     Status: Abnormal   Collection Time: 12/18/22  7:05 AM  Result Value Ref Range   Glucose-Capillary 126 (H) 70 - 99 mg/dL    Comment: Glucose reference range applies only to samples taken after fasting for at least 8 hours.  Glucose, capillary     Status: Abnormal   Collection Time: 12/18/22  7:58 AM  Result Value Ref Range   Glucose-Capillary 124 (H) 70 - 99 mg/dL    Comment: Glucose reference range applies only to samples taken after fasting for at least 8 hours.  Lipid panel     Status: Abnormal   Collection Time: 02/08/23 10:11 AM  Result Value Ref Range   Cholesterol, Total 152 100 - 199 mg/dL   Triglycerides 782 (H) 0 - 149 mg/dL   HDL 35 (L) >95 mg/dL   VLDL Cholesterol Cal 33 5 - 40 mg/dL   LDL Chol Calc (NIH) 84 0 - 99 mg/dL   Chol/HDL Ratio 4.3 0.0 - 5.0 ratio    Comment:                                   T. Chol/HDL Ratio                                             Men  Women                               1/2 Avg.Risk  3.4    3.3                                   Avg.Risk  5.0    4.4                                2X Avg.Risk  9.6    7.1                                3X Avg.Risk 23.4   11.0   VITAMIN D 25 Hydroxy (Vit-D Deficiency, Fractures)     Status: Abnormal   Collection Time: 02/08/23 10:11 AM  Result Value Ref Range  Vit D, 25-Hydroxy 7.4 (L) 30.0 - 100.0 ng/mL    Comment: Vitamin D deficiency has been defined by the Institute of Medicine and an Endocrine Society practice  guideline as a level of serum 25-OH vitamin D less than 20 ng/mL (1,2). The Endocrine Society went on to further define vitamin D insufficiency as a level between 21 and 29 ng/mL (2). 1. IOM (Institute of Medicine). 2010. Dietary reference    intakes for calcium and D. [place]: The    [employer]. 2. Holick MF, Binkley [place], Bischoff-Ferrari HA, et al.    Evaluation, treatment, and prevention of vitamin D    deficiency: an Endocrine Society clinical practice    guideline. JCEM. 2011 Jul; 96(7):1911-30.   CBC With Differential     Status: Abnormal   Collection Time: 02/08/23 10:11 AM  Result Value Ref Range   WBC 5.9 3.4 - 10.8 x10E3/uL   RBC 5.30 4.14 - 5.80 x10E6/uL   Hemoglobin 14.2 13.0 - 17.7 g/dL   Hematocrit 40.9 81.1 - 51.0 %   MCV 87 79 - 97 fL   MCH 26.8 26.6 - 33.0 pg   MCHC 30.9 (L) 31.5 - 35.7 g/dL   RDW 91.4 78.2 - 95.6 %   Neutrophils 47 Not Estab. %   Lymphs 32 Not Estab. %   Monocytes 17 Not Estab. %   Eos 2 Not Estab. %   Basos 1 Not Estab. %   Neutrophils Absolute 2.8 1.4 - 7.0 x10E3/uL   Lymphocytes Absolute 1.9 0.7 - 3.1 x10E3/uL   Monocytes Absolute 1.0 (H) 0.1 - 0.9 x10E3/uL   EOS (ABSOLUTE) 0.1 0.0 - 0.4 x10E3/uL   Basophils Absolute 0.0 0.0 - 0.2 x10E3/uL   Immature Granulocytes 1 Not Estab. %   Immature Grans (Abs) 0.0 0.0 - 0.1 x10E3/uL  CMP14+EGFR     Status: Abnormal   Collection Time: 02/08/23 10:11 AM  Result Value Ref Range   Glucose 106 (H) 70 - 99 mg/dL   BUN 11 6 - 20 mg/dL   Creatinine, Ser 2.13 0.76 - 1.27 mg/dL   eGFR 086 >57 QI/ONG/2.95   BUN/Creatinine Ratio 13 9 - 20   Sodium 144 134 - 144 mmol/L   Potassium 4.3 3.5 - 5.2 mmol/L   Chloride 103 96 - 106 mmol/L   CO2 27 20 - 29 mmol/L   Calcium 9.1 8.7 - 10.2 mg/dL   Total Protein 7.1 6.0 - 8.5 g/dL   Albumin 3.9 (L) 4.1 - 5.1 g/dL   Globulin, Total 3.2 1.5 - 4.5 g/dL   Albumin/Globulin Ratio 1.2 1.2 - 2.2   Bilirubin Total 0.4 0.0 - 1.2 mg/dL   Alkaline  Phosphatase 78 44 - 121 IU/L   AST 20 0 - 40 IU/L   ALT 28 0 - 44 IU/L  TSH     Status: None   Collection Time: 02/08/23 10:11 AM  Result Value Ref Range   TSH 2.860 0.450 - 4.500 uIU/mL  Hemoglobin A1c     Status: Abnormal   Collection Time: 02/08/23 10:11 AM  Result Value Ref Range   Hgb A1c MFr Bld 6.4 (H) 4.8 - 5.6 %    Comment:          Prediabetes: 5.7 - 6.4          Diabetes: >6.4          Glycemic control for adults with diabetes: <7.0    Est. average glucose Bld gHb Est-mCnc 137 mg/dL  Vitamin  B12     Status: None   Collection Time: 02/08/23 10:11 AM  Result Value Ref Range   Vitamin B-12 514 232 - 1,245 pg/mL       Assessment & Plan:   Problem List Items Addressed This Visit       Active Problems   Essential hypertension    Blood pressure has been controlled with current medications when taken at home after meds.  Continue current therapy.  Will reassess at follow up.       Relevant Medications   hydrochlorothiazide (HYDRODIURIL) 25 MG tablet   Obesity, Class III, BMI 40-49.9 (morbid obesity) (HCC)    Continue current meds.  Will adjust as needed based on results.  The patient is asked to make an attempt to improve diet and exercise patterns to aid in medical management of this problem. Addressed importance of increasing and maintaining water intake.        Relevant Medications   Semaglutide,0.25 or 0.5MG /DOS, 2 MG/3ML SOPN   OSA (obstructive sleep apnea)    Patient has Sleep Study order already placed from the pulmonologist.        Type 2 diabetes mellitus with hyperglycemia, without long-term current use of insulin (HCC) - Primary    Adding Ozempic to POC.  Patient educated on use of pen.   Continue current other diabetes meds, as patient has been well controlled on current regimen.  Will adjust meds if needed based on labs.        Relevant Medications   Semaglutide,0.25 or 0.5MG /DOS, 2 MG/3ML SOPN   Vitamin D deficiency, unspecified   Other  Visit Diagnoses     Other fatigue           Return in about 1 month (around 03/25/2023) for F/U.   Total time spent: 30 minutes  Miki Kins, FNP  02/22/2023   This document may have been prepared by [facility] Voice Recognition software and as such may include unintentional dictation errors.

## 2023-03-18 ENCOUNTER — Inpatient Hospital Stay: Admission: RE | Admit: 2023-03-18 | Payer: Managed Care, Other (non HMO) | Source: Ambulatory Visit

## 2023-03-20 ENCOUNTER — Other Ambulatory Visit: Payer: Managed Care, Other (non HMO)

## 2023-03-25 ENCOUNTER — Ambulatory Visit: Payer: Managed Care, Other (non HMO) | Admitting: Family

## 2023-04-01 ENCOUNTER — Ambulatory Visit (INDEPENDENT_AMBULATORY_CARE_PROVIDER_SITE_OTHER): Payer: Managed Care, Other (non HMO) | Admitting: Family

## 2023-04-01 ENCOUNTER — Encounter: Payer: Self-pay | Admitting: Family

## 2023-04-01 VITALS — BP 140/90 | HR 89 | Ht 72.0 in | Wt 340.2 lb

## 2023-04-01 DIAGNOSIS — E559 Vitamin D deficiency, unspecified: Secondary | ICD-10-CM | POA: Diagnosis not present

## 2023-04-01 DIAGNOSIS — E1165 Type 2 diabetes mellitus with hyperglycemia: Secondary | ICD-10-CM

## 2023-04-01 LAB — POC CREATINE & ALBUMIN,URINE
Albumin/Creatinine Ratio, Urine, POC: <30
Creatinine, POC: 300 mg/dL
Microalbumin Ur, POC: 30 mg/L

## 2023-04-01 MED ORDER — SEMAGLUTIDE (1 MG/DOSE) 4 MG/3ML ~~LOC~~ SOPN: 1.0000 mg | PEN_INJECTOR | SUBCUTANEOUS | 2 refills | Status: DC

## 2023-04-01 NOTE — Progress Notes (Signed)
Established Patient Office Visit  Subjective:  Patient ID: DEO MEHRINGER, male    DOB: [birth year]  Age: 37 y.o. MRN: [id number]  Chief Complaint  Patient presents with   Follow-up    1 month follow up   Patient is here today for his 6 week follow up.  He has been feeling well since last appointment.   He does not have additional concerns to discuss today.  He has been doing well with his Ozempic, and is not having any side effects.   Labs are not due today. He needs refills.   I have reviewed his active problem list, medication list, allergies, health maintenance, notes from last encounter, and lab results for his appointment today.   No other concerns at this time.   Past Medical History:  Diagnosis Date   Acute bronchitis 11/21/2021   Acute on chronic respiratory failure with hypoxia (HCC) 12/12/2022   Acute respiratory failure due to COVID-19 [facility]) 12/12/2022   Acute respiratory failure with hypoxia (HCC) 03/06/2020   Acute respiratory failure with hypoxia and hypercapnia (HCC) 03/06/2020   Bronchitis    CAP (community acquired pneumonia) 04/22/2022   COVID-19 virus infection 03/06/2020   Diarrhea 07/03/2022   Hypertension    Lower respiratory tract infection due to COVID-19 virus 07/25/2021   Pneumonia    Tobacco abuse     No past surgical history on file.  Social History   Socioeconomic History   Marital status: Single    Spouse name: Not on file   Number of children: 2   Years of education: Not on file   Highest education level: Not on file  Occupational History   Not on file  Tobacco Use   Smoking status: Former    Packs/day: 1.00    Years: 16.00    Additional pack years: 0.00    Total pack years: 16.00    Types: Cigarettes    Quit date: 12/2022    Years since quitting: 0.3   Smokeless tobacco: Never  Vaping Use   Vaping Use: Never used  Substance and Sexual Activity   Alcohol use: Not Currently   Drug use: Yes    Types: Marijuana    Sexual activity: Yes  Other Topics Concern   Not on file  Social History Narrative   Not on file   Social Determinants of Health   Financial Resource Strain: Not on file  Food Insecurity: No Food Insecurity (07/03/2022)   Hunger Vital Sign    Worried About Running Out of Food in the Last Year: Never true    Ran Out of Food in the Last Year: Never true  Transportation Needs: No Transportation Needs (07/03/2022)   PRAPARE - Administrator, Civil Service (Medical): No    Lack of Transportation (Non-Medical): No  Physical Activity: Not on file  Stress: Not on file  Social Connections: Not on file  Intimate Partner Violence: Not At Risk (07/03/2022)   Humiliation, Afraid, Rape, and Kick questionnaire    Fear of Current or Ex-Partner: No    Emotionally Abused: No    Physically Abused: No    Sexually Abused: No    Family History  Problem Relation Age of Onset   Hypertension Mother    Hypothyroidism Mother    Hypertension Other     Allergies  Allergen Reactions   Dayquil [Pseudoephedrine-Apap-Dm] Shortness Of Breath   Nyquil Multi-Symptom [Pseudoeph-Doxylamine-Dm-Apap] Shortness Of Breath   Metformin And Related Other (See Comments)  Swelling in bilateral legs, severe, causing mobility issues.     Review of Systems  All other systems reviewed and are negative.      Objective:   BP (!) 140/90   Pulse 89   Ht 6' (1.829 m)   Wt (!) 340 lb 3.2 oz (154.3 kg)   SpO2 90%   BMI 46.14 kg/m   Vitals:   04/01/23 0930  BP: (!) 140/90  Pulse: 89  Height: 6' (1.829 m)  Weight: (!) 340 lb 3.2 oz (154.3 kg)  SpO2: 90%  BMI (Calculated): 46.13    Physical Exam Vitals and nursing note reviewed.  Constitutional:      Appearance: Normal appearance. He is normal weight.  HENT:     Head: Normocephalic and atraumatic.  Eyes:     Extraocular Movements: Extraocular movements intact.     Conjunctiva/sclera: Conjunctivae normal.     Pupils: Pupils are equal,  round, and reactive to light.  Cardiovascular:     Rate and Rhythm: Normal rate and regular rhythm.     Pulses: Normal pulses.     Heart sounds: Normal heart sounds.  Pulmonary:     Effort: Pulmonary effort is normal.     Breath sounds: Normal breath sounds.  Neurological:     General: No focal deficit present.     Mental Status: He is alert and oriented to person, place, and time. Mental status is at baseline.  Psychiatric:        Mood and Affect: Mood normal.        Behavior: Behavior normal.      Results for orders placed or performed in visit on 04/01/23  POC CREATINE & ALBUMIN,URINE  Result Value Ref Range   Microalbumin Ur, POC 30 mg/L   Creatinine, POC 300 mg/dL   Albumin/Creatinine Ratio, Urine, POC <30     Recent Results (from the past 2160 hour(s))  Lipid panel     Status: Abnormal   Collection Time: 02/08/23 10:11 AM  Result Value Ref Range   Cholesterol, Total 152 100 - 199 mg/dL   Triglycerides 161 (H) 0 - 149 mg/dL   HDL 35 (L) >09 mg/dL   VLDL Cholesterol Cal 33 5 - 40 mg/dL   LDL Chol Calc (NIH) 84 0 - 99 mg/dL   Chol/HDL Ratio 4.3 0.0 - 5.0 ratio    Comment:                                   T. Chol/HDL Ratio                                             Men  Women                               1/2 Avg.Risk  3.4    3.3                                   Avg.Risk  5.0    4.4  2X Avg.Risk  9.6    7.1                                3X Avg.Risk 23.4   11.0   VITAMIN D 25 Hydroxy (Vit-D Deficiency, Fractures)     Status: Abnormal   Collection Time: 02/08/23 10:11 AM  Result Value Ref Range   Vit D, 25-Hydroxy 7.4 (L) 30.0 - 100.0 ng/mL    Comment: Vitamin D deficiency has been defined by the Institute of Medicine and an Endocrine Society practice guideline as a level of serum 25-OH vitamin D less than 20 ng/mL (1,2). The Endocrine Society went on to further define vitamin D insufficiency as a level between 21 and 29 ng/mL  (2). 1. IOM (Institute of Medicine). 2010. Dietary reference    intakes for calcium and D. [place]: The    [employer]. 2. Holick MF, Binkley [place], Bischoff-Ferrari HA, et al.    Evaluation, treatment, and prevention of vitamin D    deficiency: an Endocrine Society clinical practice    guideline. JCEM. 2011 Jul; 96(7):1911-30.   CBC With Differential     Status: Abnormal   Collection Time: 02/08/23 10:11 AM  Result Value Ref Range   WBC 5.9 3.4 - 10.8 x10E3/uL   RBC 5.30 4.14 - 5.80 x10E6/uL   Hemoglobin 14.2 13.0 - 17.7 g/dL   Hematocrit 40.9 81.1 - 51.0 %   MCV 87 79 - 97 fL   MCH 26.8 26.6 - 33.0 pg   MCHC 30.9 (L) 31.5 - 35.7 g/dL   RDW 91.4 78.2 - 95.6 %   Neutrophils 47 Not Estab. %   Lymphs 32 Not Estab. %   Monocytes 17 Not Estab. %   Eos 2 Not Estab. %   Basos 1 Not Estab. %   Neutrophils Absolute 2.8 1.4 - 7.0 x10E3/uL   Lymphocytes Absolute 1.9 0.7 - 3.1 x10E3/uL   Monocytes Absolute 1.0 (H) 0.1 - 0.9 x10E3/uL   EOS (ABSOLUTE) 0.1 0.0 - 0.4 x10E3/uL   Basophils Absolute 0.0 0.0 - 0.2 x10E3/uL   Immature Granulocytes 1 Not Estab. %   Immature Grans (Abs) 0.0 0.0 - 0.1 x10E3/uL  CMP14+EGFR     Status: Abnormal   Collection Time: 02/08/23 10:11 AM  Result Value Ref Range   Glucose 106 (H) 70 - 99 mg/dL   BUN 11 6 - 20 mg/dL   Creatinine, Ser 2.13 0.76 - 1.27 mg/dL   eGFR 086 >57 QI/ONG/2.95   BUN/Creatinine Ratio 13 9 - 20   Sodium 144 134 - 144 mmol/L   Potassium 4.3 3.5 - 5.2 mmol/L   Chloride 103 96 - 106 mmol/L   CO2 27 20 - 29 mmol/L   Calcium 9.1 8.7 - 10.2 mg/dL   Total Protein 7.1 6.0 - 8.5 g/dL   Albumin 3.9 (L) 4.1 - 5.1 g/dL   Globulin, Total 3.2 1.5 - 4.5 g/dL   Albumin/Globulin Ratio 1.2 1.2 - 2.2   Bilirubin Total 0.4 0.0 - 1.2 mg/dL   Alkaline Phosphatase 78 44 - 121 IU/L   AST 20 0 - 40 IU/L   ALT 28 0 - 44 IU/L  TSH     Status: None   Collection Time: 02/08/23 10:11 AM  Result Value Ref Range   TSH 2.860 0.450 - 4.500  uIU/mL  Hemoglobin A1c     Status: Abnormal   Collection Time: 02/08/23 10:11 AM  Result Value Ref Range   Hgb A1c MFr Bld 6.4 (H) 4.8 - 5.6 %    Comment:          Prediabetes: 5.7 - 6.4          Diabetes: >6.4          Glycemic control for adults with diabetes: <7.0    Est. average glucose Bld gHb Est-mCnc 137 mg/dL  Vitamin Z61     Status: None   Collection Time: 02/08/23 10:11 AM  Result Value Ref Range   Vitamin B-12 514 232 - 1,245 pg/mL  POC CREATINE & ALBUMIN,URINE     Status: Normal   Collection Time: 04/01/23 10:03 AM  Result Value Ref Range   Microalbumin Ur, POC 30 mg/L   Creatinine, POC 300 mg/dL   Albumin/Creatinine Ratio, Urine, POC <30        Assessment & Plan:   Problem List Items Addressed This Visit       Active Problems   Type 2 diabetes mellitus with hyperglycemia, without long-term current use of insulin (HCC)   Relevant Medications   Semaglutide, 1 MG/DOSE, 4 MG/3ML SOPN   Other Relevant Orders   Ambulatory referral to Ophthalmology   POC CREATINE & ALBUMIN,URINE (Completed)    Return in about 2 months (around 06/01/2023) for F/U.   Total time spent: 20 minutes  Miki Kins, FNP  04/01/2023   This document may have been prepared by [facility] Voice Recognition software and as such may include unintentional dictation errors.

## 2023-04-01 NOTE — Assessment & Plan Note (Signed)
Continue current meds.  Will adjust as needed based on results.  The patient is asked to make an attempt to improve diet and exercise patterns to aid in medical management of this problem. Addressed importance of increasing and maintaining water intake.   

## 2023-04-01 NOTE — Assessment & Plan Note (Signed)
increase Ozempic dose to 1mg .  Albumin in office today.  Sending referral for DEE.

## 2023-04-01 NOTE — Assessment & Plan Note (Signed)
Continue supplements. Will recheck labs at follow up.

## 2023-04-30 ENCOUNTER — Encounter: Payer: Self-pay | Admitting: Nurse Practitioner

## 2023-05-11 ENCOUNTER — Other Ambulatory Visit: Payer: Self-pay

## 2023-05-11 ENCOUNTER — Encounter: Payer: Self-pay | Admitting: Emergency Medicine

## 2023-05-11 ENCOUNTER — Emergency Department
Admission: EM | Admit: 2023-05-11 | Discharge: 2023-05-11 | Discharge status: Against Medical Advice | Payer: Managed Care, Other (non HMO) | Attending: Emergency Medicine | Admitting: Emergency Medicine

## 2023-05-11 DIAGNOSIS — R1110 Vomiting, unspecified: Secondary | ICD-10-CM | POA: Diagnosis not present

## 2023-05-11 DIAGNOSIS — T781XXA Other adverse food reactions, not elsewhere classified, initial encounter: Secondary | ICD-10-CM | POA: Diagnosis present

## 2023-05-11 DIAGNOSIS — Z5321 Procedure and treatment not carried out due to patient leaving prior to being seen by health care provider: Secondary | ICD-10-CM | POA: Insufficient documentation

## 2023-05-11 LAB — COMPREHENSIVE METABOLIC PANEL
ALT: 31 U/L (ref 0–44)
AST: 22 U/L (ref 15–41)
Albumin: 3.8 g/dL (ref 3.5–5.0)
Alkaline Phosphatase: 59 U/L (ref 38–126)
Anion gap: 8 (ref 5–15)
BUN: 14 mg/dL (ref 6–20)
CO2: 29 mmol/L (ref 22–32)
Calcium: 8.5 mg/dL — ABNORMAL LOW (ref 8.9–10.3)
Chloride: 100 mmol/L (ref 98–111)
Creatinine, Ser: 0.81 mg/dL (ref 0.61–1.24)
GFR, Estimated: >60 mL/min (ref >60)
Glucose, Bld: 106 mg/dL — ABNORMAL HIGH (ref 70–99)
Potassium: 3.9 mmol/L (ref 3.5–5.1)
Sodium: 137 mmol/L (ref 135–145)
Total Bilirubin: 0.6 mg/dL (ref 0.3–1.2)
Total Protein: 7.9 g/dL (ref 6.5–8.1)

## 2023-05-11 LAB — CBC WITH DIFFERENTIAL/PLATELET
Abs Immature Granulocytes: 0.05 10*3/uL (ref 0.00–0.07)
Basophils Absolute: 0 10*3/uL (ref 0.0–0.1)
Basophils Relative: 0 %
Eosinophils Absolute: 0 10*3/uL (ref 0.0–0.5)
Eosinophils Relative: 0 %
HCT: 53.4 % — ABNORMAL HIGH (ref 39.0–52.0)
Hemoglobin: 16.3 g/dL (ref 13.0–17.0)
Immature Granulocytes: 1 %
Lymphocytes Relative: 6 %
Lymphs Abs: 0.6 10*3/uL — ABNORMAL LOW (ref 0.7–4.0)
MCH: 27.3 pg (ref 26.0–34.0)
MCHC: 30.5 g/dL (ref 30.0–36.0)
MCV: 89.4 fL (ref 80.0–100.0)
Monocytes Absolute: 1 10*3/uL (ref 0.1–1.0)
Monocytes Relative: 9 %
Neutro Abs: 9 10*3/uL — ABNORMAL HIGH (ref 1.7–7.7)
Neutrophils Relative %: 84 %
Platelets: 235 10*3/uL (ref 150–400)
RBC: 5.97 MIL/uL — ABNORMAL HIGH (ref 4.22–5.81)
RDW: 13.4 % (ref 11.5–15.5)
WBC: 10.7 10*3/uL — ABNORMAL HIGH (ref 4.0–10.5)
nRBC: 0 % (ref 0.0–0.2)

## 2023-05-11 NOTE — ED Triage Notes (Signed)
  Patient BIB EMS for possible food poisoning.  Patient states he had leftover japanese food in his car from lunch at work, and decided to eat it several hours later.  Patient states he felt bad within an hour of eating and has had 4 episodes of emesis.  Given 4 mg zofran via EMS.  Patient states it was leftover hibachi.  Pain 7/10, sharp.

## 2023-05-31 ENCOUNTER — Ambulatory Visit: Payer: Managed Care, Other (non HMO) | Admitting: Family

## 2023-07-03 ENCOUNTER — Ambulatory Visit: Payer: Managed Care, Other (non HMO) | Admitting: Pulmonary Disease

## 2023-08-09 ENCOUNTER — Ambulatory Visit
Admission: RE | Admit: 2023-08-09 | Discharge: 2023-08-09 | Disposition: A | Discharge status: Home / Self Care | Payer: Managed Care, Other (non HMO) | Source: Ambulatory Visit | Attending: Family | Admitting: Family

## 2023-08-09 ENCOUNTER — Ambulatory Visit: Payer: Managed Care, Other (non HMO) | Admitting: Family

## 2023-08-09 ENCOUNTER — Encounter: Payer: Self-pay | Admitting: Family

## 2023-08-09 ENCOUNTER — Ambulatory Visit
Admission: RE | Admit: 2023-08-09 | Discharge: 2023-08-09 | Disposition: A | Discharge status: Home / Self Care | Payer: Managed Care, Other (non HMO) | Attending: Family | Admitting: Family

## 2023-08-09 VITALS — BP 146/96 | HR 104 | Ht 72.0 in | Wt 343.0 lb

## 2023-08-09 DIAGNOSIS — R0902 Hypoxemia: Secondary | ICD-10-CM

## 2023-08-09 DIAGNOSIS — E538 Deficiency of other specified B group vitamins: Secondary | ICD-10-CM

## 2023-08-09 DIAGNOSIS — E559 Vitamin D deficiency, unspecified: Secondary | ICD-10-CM

## 2023-08-09 DIAGNOSIS — R5383 Other fatigue: Secondary | ICD-10-CM

## 2023-08-09 DIAGNOSIS — I10 Essential (primary) hypertension: Secondary | ICD-10-CM | POA: Diagnosis not present

## 2023-08-09 DIAGNOSIS — R7303 Prediabetes: Secondary | ICD-10-CM

## 2023-08-09 DIAGNOSIS — E782 Mixed hyperlipidemia: Secondary | ICD-10-CM

## 2023-08-09 DIAGNOSIS — E1165 Type 2 diabetes mellitus with hyperglycemia: Secondary | ICD-10-CM

## 2023-08-09 MED ORDER — HYDROCHLOROTHIAZIDE 25 MG PO TABS: 25.0000 mg | ORAL_TABLET | Freq: Every day | ORAL | 1 refills | Status: DC

## 2023-08-09 MED ORDER — VITAMIN D (ERGOCALCIFEROL) 1.25 MG (50000 UNIT) PO CAPS: 50000.0000 [IU] | ORAL_CAPSULE | ORAL | 1 refills | Status: DC

## 2023-08-09 MED ORDER — AMLODIPINE BESYLATE 10 MG PO TABS: 10.0000 mg | ORAL_TABLET | Freq: Every day | ORAL | 1 refills | Status: DC

## 2023-08-09 MED ORDER — TIRZEPATIDE 2.5 MG/0.5ML ~~LOC~~ SOAJ: 2.5000 mg | SUBCUTANEOUS | 0 refills | Status: DC

## 2023-08-10 LAB — CMP14+EGFR
ALT: 67 [IU]/L — ABNORMAL HIGH (ref 0–44)
AST: 39 [IU]/L (ref 0–40)
Albumin: 3.9 g/dL — ABNORMAL LOW (ref 4.1–5.1)
Alkaline Phosphatase: 69 [IU]/L (ref 44–121)
BUN/Creatinine Ratio: 19 (ref 9–20)
BUN: 17 mg/dL (ref 6–20)
Bilirubin Total: 0.5 mg/dL (ref 0.0–1.2)
CO2: 30 mmol/L — ABNORMAL HIGH (ref 20–29)
Calcium: 8.8 mg/dL (ref 8.7–10.2)
Chloride: 101 mmol/L (ref 96–106)
Creatinine, Ser: 0.91 mg/dL (ref 0.76–1.27)
Globulin, Total: 2.7 g/dL (ref 1.5–4.5)
Glucose: 122 mg/dL — ABNORMAL HIGH (ref 70–99)
Potassium: 4.3 mmol/L (ref 3.5–5.2)
Sodium: 145 mmol/L — ABNORMAL HIGH (ref 134–144)
Total Protein: 6.6 g/dL (ref 6.0–8.5)
eGFR: 113 mL/min/{1.73_m2} (ref >59)

## 2023-08-10 LAB — LIPID PANEL
Chol/HDL Ratio: 4.8 ratio (ref 0.0–5.0)
Cholesterol, Total: 157 mg/dL (ref 100–199)
HDL: 33 mg/dL — ABNORMAL LOW (ref >39)
LDL Chol Calc (NIH): 86 mg/dL (ref 0–99)
Triglycerides: 225 mg/dL — ABNORMAL HIGH (ref 0–149)
VLDL Cholesterol Cal: 38 mg/dL (ref 5–40)

## 2023-08-10 LAB — CBC WITH DIFFERENTIAL/PLATELET
Basophils Absolute: 0 10*3/uL (ref 0.0–0.2)
Basos: 0 %
EOS (ABSOLUTE): 0.1 10*3/uL (ref 0.0–0.4)
Eos: 1 %
Hematocrit: 56.4 % — ABNORMAL HIGH (ref 37.5–51.0)
Hemoglobin: 16.3 g/dL (ref 13.0–17.7)
Immature Grans (Abs): 0.1 10*3/uL (ref 0.0–0.1)
Immature Granulocytes: 1 %
Lymphocytes Absolute: 1.9 10*3/uL (ref 0.7–3.1)
Lymphs: 20 %
MCH: 26 pg — ABNORMAL LOW (ref 26.6–33.0)
MCHC: 28.9 g/dL — ABNORMAL LOW (ref 31.5–35.7)
MCV: 90 fL (ref 79–97)
Monocytes Absolute: 1.2 10*3/uL — ABNORMAL HIGH (ref 0.1–0.9)
Monocytes: 13 %
Neutrophils Absolute: 6.2 10*3/uL (ref 1.4–7.0)
Neutrophils: 65 %
Platelets: 230 10*3/uL (ref 150–450)
RBC: 6.26 x10E6/uL — ABNORMAL HIGH (ref 4.14–5.80)
RDW: 13.3 % (ref 11.6–15.4)
WBC: 9.5 10*3/uL (ref 3.4–10.8)

## 2023-08-10 LAB — HEMOGLOBIN A1C
Est. average glucose Bld gHb Est-mCnc: 140 mg/dL
Hgb A1c MFr Bld: 6.5 % — ABNORMAL HIGH (ref 4.8–5.6)

## 2023-08-10 LAB — TSH: TSH: 3.64 u[IU]/mL (ref 0.450–4.500)

## 2023-08-10 LAB — VITAMIN B12: Vitamin B-12: 506 pg/mL (ref 232–1245)

## 2023-08-10 LAB — VITAMIN D 25 HYDROXY (VIT D DEFICIENCY, FRACTURES): Vit D, 25-Hydroxy: 11.8 ng/mL — ABNORMAL LOW (ref 30.0–100.0)

## 2023-08-14 ENCOUNTER — Telehealth: Payer: Self-pay | Admitting: Family

## 2023-08-14 MED ORDER — LEVOFLOXACIN 750 MG PO TABS: 750.0000 mg | ORAL_TABLET | Freq: Every day | ORAL | 0 refills | Status: AC

## 2023-08-14 NOTE — Telephone Encounter (Signed)
Patient has pneumonia I sent antibiotics.  I have tried to call him on Saturday and today, but with no answer.  I left voicemails both times.   Please let him know if he calls back.

## 2023-08-26 ENCOUNTER — Ambulatory Visit: Payer: Managed Care, Other (non HMO) | Admitting: Family

## 2023-08-28 ENCOUNTER — Encounter: Payer: Self-pay | Admitting: Family

## 2023-08-28 ENCOUNTER — Ambulatory Visit (INDEPENDENT_AMBULATORY_CARE_PROVIDER_SITE_OTHER): Payer: Managed Care, Other (non HMO) | Admitting: Family

## 2023-08-28 VITALS — BP 112/86 | HR 100 | Ht 72.0 in | Wt 333.4 lb

## 2023-08-28 DIAGNOSIS — R0902 Hypoxemia: Secondary | ICD-10-CM

## 2023-08-28 DIAGNOSIS — Z0130 Encounter for examination of blood pressure without abnormal findings: Secondary | ICD-10-CM

## 2023-08-28 DIAGNOSIS — E1165 Type 2 diabetes mellitus with hyperglycemia: Secondary | ICD-10-CM

## 2023-08-28 MED ORDER — MOUNJARO 5 MG/0.5ML ~~LOC~~ SOAJ: 5.0000 mg | SUBCUTANEOUS | 3 refills | Status: DC

## 2023-08-28 NOTE — Patient Instructions (Addendum)
Amlodipine 1/2 current pills: take 5 mg.   Breztri Inhaler start today.

## 2023-09-09 ENCOUNTER — Other Ambulatory Visit: Payer: Managed Care, Other (non HMO)

## 2023-09-12 ENCOUNTER — Ambulatory Visit: Payer: Managed Care, Other (non HMO) | Admitting: Family

## 2023-09-14 ENCOUNTER — Other Ambulatory Visit: Payer: Self-pay | Admitting: Family

## 2023-09-23 ENCOUNTER — Ambulatory Visit: Payer: Managed Care, Other (non HMO) | Admitting: Pulmonary Disease

## 2023-09-23 ENCOUNTER — Encounter: Payer: Self-pay | Admitting: Pulmonary Disease

## 2023-10-08 ENCOUNTER — Telehealth: Payer: Self-pay | Admitting: Family

## 2023-10-08 NOTE — Telephone Encounter (Signed)
Patient left VM that he needs a note for work stating that he has sleep apnea. Please advise.

## 2023-10-13 NOTE — Assessment & Plan Note (Signed)
 Blood pressure well controlled with current medications.  Continue current therapy.  Will reassess at follow up.

## 2023-10-13 NOTE — Assessment & Plan Note (Signed)
 Checking labs today.  Will continue supplements as needed.

## 2023-10-13 NOTE — Assessment & Plan Note (Signed)
 Continue current meds.  Will adjust as needed based on results.  The patient is asked to make an attempt to improve diet and exercise patterns to aid in medical management of this problem. Addressed importance of increasing and maintaining water intake.

## 2023-10-13 NOTE — Assessment & Plan Note (Signed)
 Checking labs today. Will call pt. With results  Continue current diabetes POC, as patient has been well controlled on current regimen.  Will adjust meds if needed based on labs.

## 2023-10-22 ENCOUNTER — Emergency Department
Admission: EM | Admit: 2023-10-22 | Discharge: 2023-10-22 | Disposition: A | Discharge status: Home / Self Care | Payer: Managed Care, Other (non HMO) | Attending: Emergency Medicine | Admitting: Emergency Medicine

## 2023-10-22 ENCOUNTER — Other Ambulatory Visit: Payer: Self-pay

## 2023-10-22 ENCOUNTER — Encounter: Payer: Self-pay | Admitting: Family

## 2023-10-22 ENCOUNTER — Emergency Department: Payer: Managed Care, Other (non HMO)

## 2023-10-22 ENCOUNTER — Telehealth: Payer: Self-pay

## 2023-10-22 DIAGNOSIS — G4730 Sleep apnea, unspecified: Secondary | ICD-10-CM

## 2023-10-22 DIAGNOSIS — M25512 Pain in left shoulder: Secondary | ICD-10-CM

## 2023-10-22 DIAGNOSIS — I10 Essential (primary) hypertension: Secondary | ICD-10-CM | POA: Insufficient documentation

## 2023-10-22 LAB — BASIC METABOLIC PANEL
Anion gap: 10 (ref 5–15)
BUN: 14 mg/dL (ref 6–20)
CO2: 29 mmol/L (ref 22–32)
Calcium: 9 mg/dL (ref 8.9–10.3)
Chloride: 103 mmol/L (ref 98–111)
Creatinine, Ser: 0.74 mg/dL (ref 0.61–1.24)
GFR, Estimated: >60 mL/min (ref >60)
Glucose, Bld: 96 mg/dL (ref 70–99)
Potassium: 4 mmol/L (ref 3.5–5.1)
Sodium: 142 mmol/L (ref 135–145)

## 2023-10-22 LAB — CBC
HCT: 58.2 % — ABNORMAL HIGH (ref 39.0–52.0)
Hemoglobin: 17.1 g/dL — ABNORMAL HIGH (ref 13.0–17.0)
MCH: 26 pg (ref 26.0–34.0)
MCHC: 29.4 g/dL — ABNORMAL LOW (ref 30.0–36.0)
MCV: 88.4 fL (ref 80.0–100.0)
Platelets: 194 10*3/uL (ref 150–400)
RBC: 6.58 MIL/uL — ABNORMAL HIGH (ref 4.22–5.81)
RDW: 17.2 % — ABNORMAL HIGH (ref 11.5–15.5)
WBC: 8.9 10*3/uL (ref 4.0–10.5)
nRBC: 0 % (ref 0.0–0.2)

## 2023-10-22 LAB — TROPONIN I (HIGH SENSITIVITY)
Troponin I (High Sensitivity): 16 ng/L (ref <18)
Troponin I (High Sensitivity): 16 ng/L (ref <18)

## 2023-10-22 MED ORDER — NICOTINE 21 MG/24HR TD PT24: 21.0000 mg | MEDICATED_PATCH | TRANSDERMAL | 0 refills | Status: AC

## 2023-10-22 NOTE — Telephone Encounter (Signed)
 Printed to be signed and let pt know its ready for pick up

## 2023-10-22 NOTE — ED Provider Notes (Signed)
 [facility] Provider Note    Event Date/Time   First MD Initiated Contact with Patient 10/22/23 1122     (approximate)   History   Shoulder Pain   HPI  Carolyn Maniscalco Ganas is a 38 y.o. male  obesity, tobacco abuse, hypertension, OSA who comes in with shoulder pain.  Patient reports left shoulder pain this mronigng   I reviewed patient's notes from 11/1 where his oxygen level was 85% at that time.  There was concern for potential for pneumonia  Patient reports that he had multiple admissions due to low oxygen level alerts in the setting of him sleeping.  He states that he does not have any shortness of breath cough or symptoms of his respiratory issues.  He states that his oxygen levels do dip low and that he is very tired because he works night shift and that normally he is sleeping now on his BiPAP.  His wife is going to drive him home and they deny any concerns for respiratory concerns.  He reports sudden onset of left shoulder pain that has now gone away.  He states that he wanted make sure that it was not his heart.  He feels at his normal baseline self at this time.   Physical Exam   Triage Vital Signs: ED Triage Vitals  Encounter Vitals Group     BP 10/22/23 0811 (!) 145/103     Systolic BP Percentile --      Diastolic BP Percentile --      Pulse Rate 10/22/23 0811 91     Resp 10/22/23 0811 20     Temp 10/22/23 0811 98 F (36.7 C)     Temp Source 10/22/23 1112 Oral     SpO2 10/22/23 0811 97 %     Weight 10/22/23 0810 (!) 330 lb (149.7 kg)     Height 10/22/23 0810 6' (1.829 m)     Head Circumference --      Peak Flow --      Pain Score 10/22/23 0810 9     Pain Loc --      Pain Education --      Exclude from Growth Chart --     Most recent vital signs: Vitals:   10/22/23 0811 10/22/23 1112  BP: (!) 145/103 (!) 176/92  Pulse: 91 96  Resp: 20 (!) 22  Temp: 98 F (36.7 C) 98.2 F (36.8 C)  SpO2: 97% (!) 85%     General: Awake, no  distress.  CV:  Good peripheral perfusion.  Resp:  Normal effort.  Clear lungs Abd:  No distention.  Other:  Elevated BMI.  Cranial nerves II through XII are intact.  Equal strength in arms and legs.  Her lungs bilaterally.  Trace edema bilaterally but patient reports is baseline. Good distal pulse left arm. No numbness or tingling.  Equal radial pulses Equal DP pulses ED Results / Procedures / Treatments   Labs (all labs ordered are listed, but only abnormal results are displayed) Labs Reviewed  CBC - Abnormal; Notable for the following components:      Result Value   RBC 6.58 (*)    Hemoglobin 17.1 (*)    HCT 58.2 (*)    MCHC 29.4 (*)    RDW 17.2 (*)    All other components within normal limits  BASIC METABOLIC PANEL  TROPONIN I (HIGH SENSITIVITY)  TROPONIN I (HIGH SENSITIVITY)     EKG  My interpretation of EKG:  Normal sinus rhythm 94 without any ST elevation or T wave inversions, normal intervals  RADIOLOGY I have reviewed the xray personally and interpreted no evidence of any pneumonia PROCEDURES:  Critical Care performed: No  Procedures   MEDICATIONS ORDERED IN ED: Medications - No data to display   IMPRESSION / MDM / ASSESSMENT AND PLAN / ED COURSE  I reviewed the triage vital signs and the nursing notes.   Patient's presentation is most consistent with acute presentation with potential threat to life or bodily function.   Patient comes in with left shoulder pain.  Reports feeling at his baseline self now.  When I went to the room patient's oxygen levels were low but he was asleep.  His partner is at bedside who reports significant history of sleep apnea needing BiPAP.  They report that he is only sleepy because he supposed to be sleeping now because he was on night shift last night.  They deny any respiratory concerns.  We discussed CT imaging evaluate for pulmonary embolism or any other cause and he they declined stating he is been here multiple times for  similar and that he if he just gets home gets put on his BiPAP he will be back to his baseline self.  Discussed my concern that if he fell asleep in the car we do not want his oxygen levels going to low and they stated that they live very close and that he can stay awake while driving and then be put on his BiPAP and will be fine.  He denies any respiratory concerns.  His initial troponin was negative, CBC without any anemia.  BMP without any kidney dysfunction.  Doubt dissection given resolution of chest pain and no other signs of this.  I did place patient on some oxygen briefly while he is awaiting his troponin results given he is sleepy and he is falling asleep due to him typically working night shift but if repeat troponin is negative they do not want to have any further workup done and they feel comfortable with discharge and gave him back home and put on his BiPAP.  Patient was taken off the oxygen and his oxygen levels were normal.  We watched patient  and patient was awake and wanted to go home.  His oxygen levels remain normal 92% while he is awake.  He is going to stay awake in the car and then he can be put on his BiPAP when he gets home to help with his sleep apnea.  This time they are requesting discharge home and feel comfortable with this plan.  His partner is at bedside who is agreeable to this plan.  He is joking around with me stating that he feels at his normal self.  And that he will return if symptoms are worsening.    FINAL CLINICAL IMPRESSION(S) / ED DIAGNOSES   Final diagnoses:  Acute pain of left shoulder  Sleep apnea, unspecified type     Rx / DC Orders   ED Discharge Orders     None        Note:  This document was prepared using Dragon voice recognition software and may include unintentional dictation errors.   Ernest Ronal BRAVO, MD 10/22/23 [phone]

## 2023-10-22 NOTE — ED Triage Notes (Signed)
 Pt to ED [facility] for sudden left shoulder pain started today. Denies shob, n/v. Worsening pain with movement.

## 2023-10-22 NOTE — Discharge Instructions (Signed)
 We discussed further workup with CT scan to evaluate for your lungs but given you report you are at your baseline self and your oxygen drops are related to sleep apnea you have opted to want to go home.  Return to the ER if you develop shortness of breath worsening symptoms or any other concerns.

## 2023-10-22 NOTE — ED Provider Triage Note (Signed)
 Emergency Medicine Provider Triage Evaluation Note  Kerrigan Glendening Toren , a 38 y.o. male  was evaluated in triage.  Pt complains of left shoulder pain that started this am.  No hx of injury.  States he put his hand in his pocket.  No prior shoulder injury.  Hx of HTN, did not take medication this am.   Review of Systems  Positive: Left shoulder pain.  Decreased ROM with shoulder.  Negative: No SOB, no N/v/d.    Physical Exam  BP (!) 145/103   Pulse 91   Temp 98 F (36.7 C)   Resp 20   Ht 6' (1.829 m)   Wt (!) 149.7 kg   SpO2 97%   BMI 44.76 kg/m  Gen:   Awake, no distress   Able to answer question, no shortness of breath.   Resp:  Normal effort.   Lungs clear  MSK:   Moves extremities and guarding movement of left shoulder but able to move with minimal assistance.   Right eye with crusty lashes.   Other:    Medical Decision Making  Medically screening exam initiated at 8:15 AM.  Appropriate orders placed.  Lymon D Futch was informed that the remainder of the evaluation will be completed by another provider, this initial triage assessment does not replace that evaluation, and the importance of remaining in the ED until their evaluation is complete.     Saunders Shona CROME, PA-C 10/22/23 [phone]

## 2023-10-22 NOTE — Telephone Encounter (Signed)
 LM for pt to inform him letter is ready for pick up downstairs

## 2023-10-27 MED ORDER — BREZTRI AEROSPHERE 160-9-4.8 MCG/ACT IN AERO: 2.0000 | INHALATION_SPRAY | Freq: Two times a day (BID) | RESPIRATORY_TRACT | 3 refills | Status: DC

## 2023-10-27 NOTE — Progress Notes (Signed)
Established Patient Office Visit  Subjective:  Patient ID: Ryan Maxwell, male    DOB: [birth year]  Age: 38 y.o. MRN: [id number]  Chief Complaint  Patient presents with   Follow-up    2 week follow up    Patient is here today for his 2 week follow up.  He has been feeling better, but still poorly since last appointment.   He does have additional concerns to discuss today.  Labs are due today. He needs refills.   I have reviewed his active problem list, medication list, allergies, notes from last encounter, lab results for his appointment today.      No other concerns at this time.   Past Medical History:  Diagnosis Date   Acute bronchitis 11/21/2021   Acute on chronic respiratory failure with hypoxia (HCC) 12/12/2022   Acute respiratory failure due to COVID-19 [facility]) 12/12/2022   Acute respiratory failure with hypoxia (HCC) 03/06/2020   Acute respiratory failure with hypoxia and hypercapnia (HCC) 03/06/2020   Bronchitis    CAP (community acquired pneumonia) 04/22/2022   COVID-19 virus infection 03/06/2020   Diarrhea 07/03/2022   Hypertension    Lower respiratory tract infection due to COVID-19 virus 07/25/2021   Pneumonia    Tobacco abuse     History reviewed. No pertinent surgical history.  Social History   Socioeconomic History   Marital status: Single    Spouse name: Not on file   Number of children: 2   Years of education: Not on file   Highest education level: Not on file  Occupational History   Not on file  Tobacco Use   Smoking status: Former    Current packs/day: 0.00    Average packs/day: 1 pack/day for 16.0 years (16.0 ttl pk-yrs)    Types: Cigarettes    Start date: 12/2006    Quit date: 12/2022    Years since quitting: 0.8   Smokeless tobacco: Never  Vaping Use   Vaping status: Never Used  Substance and Sexual Activity   Alcohol use: Not Currently   Drug use: Yes    Types: Marijuana   Sexual activity: Yes  Other Topics Concern    Not on file  Social History Narrative   Not on file   Social Drivers of Health   Financial Resource Strain: Not on file  Food Insecurity: No Food Insecurity (07/03/2022)   Hunger Vital Sign    Worried About Running Out of Food in the Last Year: Never true    Ran Out of Food in the Last Year: Never true  Transportation Needs: No Transportation Needs (07/03/2022)   PRAPARE - Administrator, Civil Service (Medical): No    Lack of Transportation (Non-Medical): No  Physical Activity: Not on file  Stress: Not on file  Social Connections: Not on file  Intimate Partner Violence: Not At Risk (07/03/2022)   Humiliation, Afraid, Rape, and Kick questionnaire    Fear of Current or Ex-Partner: No    Emotionally Abused: No    Physically Abused: No    Sexually Abused: No    Family History  Problem Relation Age of Onset   Hypertension Mother    Hypothyroidism Mother    Hypertension Other     Allergies  Allergen Reactions   Dayquil [Pseudoephedrine-Apap-Dm] Shortness Of Breath   Nyquil Multi-Symptom [Pseudoeph-Doxylamine-Dm-Apap] Shortness Of Breath   Metformin And Related Other (See Comments)    Swelling in bilateral legs, severe, causing mobility issues.     Review  of Systems  Respiratory:  Positive for cough, sputum production and shortness of breath.   All other systems reviewed and are negative.      Objective:   BP 112/86   Pulse 100   Ht 6' (1.829 m)   Wt (!) 333 lb 6.4 oz (151.2 kg)   SpO2 (!) 84%   BMI 45.22 kg/m   Vitals:   08/28/23 1404  BP: 112/86  Pulse: 100  Height: 6' (1.829 m)  Weight: (!) 333 lb 6.4 oz (151.2 kg)  SpO2: (!) 84%  BMI (Calculated): 45.21    Physical Exam Vitals and nursing note reviewed.  Constitutional:      Appearance: Normal appearance. He is normal weight.  Eyes:     Pupils: Pupils are equal, round, and reactive to light.  Cardiovascular:     Rate and Rhythm: Normal rate and regular rhythm.     Pulses: Normal  pulses.     Heart sounds: Normal heart sounds.  Pulmonary:     Effort: Pulmonary effort is normal.     Breath sounds: Wheezing and rhonchi present.  Neurological:     General: No focal deficit present.     Mental Status: He is alert and oriented to person, place, and time.  Psychiatric:        Mood and Affect: Mood normal.        Behavior: Behavior normal.        Thought Content: Thought content normal.        Judgment: Judgment normal.      No results found for any visits on 08/28/23.  Recent Results (from the past 2160 hours)  Lipid panel     Status: Abnormal   Collection Time: 08/09/23  1:48 PM  Result Value Ref Range   Cholesterol, Total 157 100 - 199 mg/dL   Triglycerides 536 (H) 0 - 149 mg/dL   HDL 33 (L) >64 mg/dL   VLDL Cholesterol Cal 38 5 - 40 mg/dL   LDL Chol Calc (NIH) 86 0 - 99 mg/dL   Chol/HDL Ratio 4.8 0.0 - 5.0 ratio    Comment:                                   T. Chol/HDL Ratio                                             Men  Women                               1/2 Avg.Risk  3.4    3.3                                   Avg.Risk  5.0    4.4                                2X Avg.Risk  9.6    7.1                                3X Avg.Risk 23.4  11.0   VITAMIN D 25 Hydroxy (Vit-D Deficiency, Fractures)     Status: Abnormal   Collection Time: 08/09/23  1:48 PM  Result Value Ref Range   Vit D, 25-Hydroxy 11.8 (L) 30.0 - 100.0 ng/mL    Comment: Vitamin D deficiency has been defined by the Institute of Medicine and an Endocrine Society practice guideline as a level of serum 25-OH vitamin D less than 20 ng/mL (1,2). The Endocrine Society went on to further define vitamin D insufficiency as a level between 21 and 29 ng/mL (2). 1. IOM (Institute of Medicine). 2010. Dietary reference    intakes for calcium and D. [place]: The    [employer]. 2. Holick MF, Binkley Torrance, Bischoff-Ferrari HA, et al.    Evaluation, treatment, and prevention of  vitamin D    deficiency: an Endocrine Society clinical practice    guideline. JCEM. 2011 Jul; 96(7):1911-30.   CMP14+EGFR     Status: Abnormal   Collection Time: 08/09/23  1:48 PM  Result Value Ref Range   Glucose 122 (H) 70 - 99 mg/dL   BUN 17 6 - 20 mg/dL   Creatinine, Ser 7.25 0.76 - 1.27 mg/dL   eGFR 366 >44 IH/KVQ/2.59   BUN/Creatinine Ratio 19 9 - 20   Sodium 145 (H) 134 - 144 mmol/L   Potassium 4.3 3.5 - 5.2 mmol/L   Chloride 101 96 - 106 mmol/L   CO2 30 (H) 20 - 29 mmol/L   Calcium 8.8 8.7 - 10.2 mg/dL   Total Protein 6.6 6.0 - 8.5 g/dL   Albumin 3.9 (L) 4.1 - 5.1 g/dL   Globulin, Total 2.7 1.5 - 4.5 g/dL   Bilirubin Total 0.5 0.0 - 1.2 mg/dL   Alkaline Phosphatase 69 44 - 121 IU/L   AST 39 0 - 40 IU/L   ALT 67 (H) 0 - 44 IU/L  TSH     Status: None   Collection Time: 08/09/23  1:48 PM  Result Value Ref Range   TSH 3.640 0.450 - 4.500 uIU/mL  Hemoglobin A1c     Status: Abnormal   Collection Time: 08/09/23  1:48 PM  Result Value Ref Range   Hgb A1c MFr Bld 6.5 (H) 4.8 - 5.6 %    Comment:          Prediabetes: 5.7 - 6.4          Diabetes: >6.4          Glycemic control for adults with diabetes: <7.0    Est. average glucose Bld gHb Est-mCnc 140 mg/dL  Vitamin D63     Status: None   Collection Time: 08/09/23  1:48 PM  Result Value Ref Range   Vitamin B-12 506 232 - 1,245 pg/mL  CBC with Diff     Status: Abnormal   Collection Time: 08/09/23  1:48 PM  Result Value Ref Range   WBC 9.5 3.4 - 10.8 x10E3/uL   RBC 6.26 (H) 4.14 - 5.80 x10E6/uL   Hemoglobin 16.3 13.0 - 17.7 g/dL   Hematocrit 87.5 (H) 64.3 - 51.0 %   MCV 90 79 - 97 fL   MCH 26.0 (L) 26.6 - 33.0 pg   MCHC 28.9 (L) 31.5 - 35.7 g/dL   RDW 32.9 51.8 - 84.1 %   Platelets 230 150 - 450 x10E3/uL   Neutrophils 65 Not Estab. %   Lymphs 20 Not Estab. %   Monocytes 13 Not Estab. %   Eos 1 Not Estab. %  Basos 0 Not Estab. %   Neutrophils Absolute 6.2 1.4 - 7.0 x10E3/uL   Lymphocytes Absolute 1.9 0.7 - 3.1  x10E3/uL   Monocytes Absolute 1.2 (H) 0.1 - 0.9 x10E3/uL   EOS (ABSOLUTE) 0.1 0.0 - 0.4 x10E3/uL   Basophils Absolute 0.0 0.0 - 0.2 x10E3/uL   Immature Granulocytes 1 Not Estab. %   Immature Grans (Abs) 0.1 0.0 - 0.1 x10E3/uL  Basic metabolic panel     Status: None   Collection Time: 10/22/23  8:14 AM  Result Value Ref Range   Sodium 142 135 - 145 mmol/L   Potassium 4.0 3.5 - 5.1 mmol/L   Chloride 103 98 - 111 mmol/L   CO2 29 22 - 32 mmol/L   Glucose, Bld 96 70 - 99 mg/dL    Comment: Glucose reference range applies only to samples taken after fasting for at least 8 hours.   BUN 14 6 - 20 mg/dL   Creatinine, Ser 1.61 0.61 - 1.24 mg/dL   Calcium 9.0 8.9 - 09.6 mg/dL   GFR, Estimated >04 >54 mL/min    Comment: (NOTE) Calculated using the CKD-EPI Creatinine Equation (2021)    Anion gap 10 5 - 15    Comment: Performed at [facility], [address] [place], [place] [address]  CBC     Status: Abnormal   Collection Time: 10/22/23  8:14 AM  Result Value Ref Range   WBC 8.9 4.0 - 10.5 K/uL   RBC 6.58 (H) 4.22 - 5.81 MIL/uL   Hemoglobin 17.1 (H) 13.0 - 17.0 g/dL   HCT 91.4 (H) 78.2 - 95.6 %    Comment: REPEATED TO VERIFY   MCV 88.4 80.0 - 100.0 fL   MCH 26.0 26.0 - 34.0 pg   MCHC 29.4 (L) 30.0 - 36.0 g/dL   RDW 21.3 (H) 08.6 - 57.8 %   Platelets 194 150 - 400 K/uL   nRBC 0.0 0.0 - 0.2 %    Comment: Performed at [facility], [address] [place], [place] [address]  Troponin I (High Sensitivity)     Status: None   Collection Time: 10/22/23  8:14 AM  Result Value Ref Range   Troponin I (High Sensitivity) 16 <18 ng/L    Comment: (NOTE) Elevated high sensitivity troponin I (hsTnI) values and significant  changes across serial measurements may suggest ACS but many other  chronic and acute conditions are known to elevate hsTnI results.  Refer to the "Links" section for chest pain algorithms and additional  guidance. Performed at [facility], [address] [place], [place] [address]   Troponin I (High Sensitivity)     Status: None   Collection Time: 10/22/23 11:32 AM  Result Value Ref Range   Troponin I (High Sensitivity) 16 <18 ng/L    Comment: (NOTE) Elevated high sensitivity troponin I (hsTnI) values and significant  changes across serial measurements may suggest ACS but many other  chronic and acute conditions are known to elevate hsTnI results.  Refer to the "Links" section for chest pain algorithms and additional  guidance. Performed at [facility], [address] [place], [place] [address]        Assessment & Plan:   Problem List Items Addressed This Visit       Endocrine   Type 2 diabetes mellitus with hyperglycemia, without long-term current use of insulin (HCC) - Primary   Increase mounjaro to 5mg . Will reassess at follow up.  Relevant Medications   tirzepatide (MOUNJARO) 5 MG/0.5ML Pen     Other   Obesity, Class III, BMI 40-49.9 (morbid obesity) (HCC)   Continue current meds.  Will adjust as needed based on results.  The patient is asked to make an attempt to improve diet and exercise patterns to aid in medical management of this problem. Addressed importance of increasing and maintaining water intake.        Relevant Medications   tirzepatide (MOUNJARO) 5 MG/0.5ML Pen   Other Visit Diagnoses       Hypoxia       patient given samples of Breztri.  Will let me know if this does not improve his breathing.   Reassess at follow up.       Return in about 2 weeks (around 09/11/2023) for F/U.   Total time spent: 20 minutes  Miki Kins, FNP  08/28/2023   This document may have been prepared by [facility] Voice Recognition software and as such may include unintentional dictation errors.

## 2023-10-27 NOTE — Assessment & Plan Note (Signed)
Increase mounjaro to 5mg . Will reassess at follow up.

## 2023-10-27 NOTE — Assessment & Plan Note (Signed)
Continue current meds.  Will adjust as needed based on results.  The patient is asked to make an attempt to improve diet and exercise patterns to aid in medical management of this problem. Addressed importance of increasing and maintaining water intake.   

## 2023-11-10 ENCOUNTER — Emergency Department: Payer: Managed Care, Other (non HMO)

## 2023-11-10 ENCOUNTER — Emergency Department
Admission: EM | Admit: 2023-11-10 | Discharge: 2023-11-10 | Disposition: A | Discharge status: Home / Self Care | Payer: Managed Care, Other (non HMO) | Attending: Emergency Medicine | Admitting: Emergency Medicine

## 2023-11-10 ENCOUNTER — Other Ambulatory Visit: Payer: Self-pay

## 2023-11-10 DIAGNOSIS — R42 Dizziness and giddiness: Secondary | ICD-10-CM | POA: Diagnosis not present

## 2023-11-10 DIAGNOSIS — R55 Syncope and collapse: Secondary | ICD-10-CM | POA: Insufficient documentation

## 2023-11-10 DIAGNOSIS — W19XXXA Unspecified fall, initial encounter: Secondary | ICD-10-CM | POA: Insufficient documentation

## 2023-11-10 DIAGNOSIS — R0902 Hypoxemia: Secondary | ICD-10-CM | POA: Insufficient documentation

## 2023-11-10 LAB — COMPREHENSIVE METABOLIC PANEL
ALT: 31 U/L (ref 0–44)
AST: 24 U/L (ref 15–41)
Albumin: 3 g/dL — ABNORMAL LOW (ref 3.5–5.0)
Alkaline Phosphatase: 45 U/L (ref 38–126)
Anion gap: 9 (ref 5–15)
BUN: 22 mg/dL — ABNORMAL HIGH (ref 6–20)
CO2: 31 mmol/L (ref 22–32)
Calcium: 7.7 mg/dL — ABNORMAL LOW (ref 8.9–10.3)
Chloride: 102 mmol/L (ref 98–111)
Creatinine, Ser: 0.78 mg/dL (ref 0.61–1.24)
GFR, Estimated: >60 mL/min (ref >60)
Glucose, Bld: 140 mg/dL — ABNORMAL HIGH (ref 70–99)
Potassium: 3.7 mmol/L (ref 3.5–5.1)
Sodium: 142 mmol/L (ref 135–145)
Total Bilirubin: 0.9 mg/dL (ref 0.0–1.2)
Total Protein: 6.3 g/dL — ABNORMAL LOW (ref 6.5–8.1)

## 2023-11-10 LAB — CBC WITH DIFFERENTIAL/PLATELET
Abs Immature Granulocytes: 0.05 10*3/uL (ref 0.00–0.07)
Basophils Absolute: 0 10*3/uL (ref 0.0–0.1)
Basophils Relative: 1 %
Eosinophils Absolute: 0.1 10*3/uL (ref 0.0–0.5)
Eosinophils Relative: 2 %
HCT: 54.6 % — ABNORMAL HIGH (ref 39.0–52.0)
Hemoglobin: 15.8 g/dL (ref 13.0–17.0)
Immature Granulocytes: 1 %
Lymphocytes Relative: 21 %
Lymphs Abs: 1.4 10*3/uL (ref 0.7–4.0)
MCH: 26 pg (ref 26.0–34.0)
MCHC: 28.9 g/dL — ABNORMAL LOW (ref 30.0–36.0)
MCV: 89.8 fL (ref 80.0–100.0)
Monocytes Absolute: 1 10*3/uL (ref 0.1–1.0)
Monocytes Relative: 15 %
Neutro Abs: 4.3 10*3/uL (ref 1.7–7.7)
Neutrophils Relative %: 60 %
Platelets: 159 10*3/uL (ref 150–400)
RBC: 6.08 MIL/uL — ABNORMAL HIGH (ref 4.22–5.81)
RDW: 17.2 % — ABNORMAL HIGH (ref 11.5–15.5)
WBC: 7 10*3/uL (ref 4.0–10.5)
nRBC: 0.3 % — ABNORMAL HIGH (ref 0.0–0.2)

## 2023-11-10 LAB — D-DIMER, QUANTITATIVE: D-Dimer, Quant: 0.7 ug{FEU}/mL — ABNORMAL HIGH (ref 0.00–0.50)

## 2023-11-10 LAB — TROPONIN I (HIGH SENSITIVITY)
Troponin I (High Sensitivity): 15 ng/L (ref <18)
Troponin I (High Sensitivity): 15 ng/L (ref <18)

## 2023-11-10 MED ORDER — IOHEXOL 350 MG/ML SOLN
75.0000 mL | Freq: Once | INTRAVENOUS | Status: AC | PRN
  Administered 2023-11-10: 75 mL via INTRAVENOUS

## 2023-11-10 NOTE — ED Triage Notes (Signed)
Pt brought in by EMS from home - recent syncopal episodes and falls resulting in a right arm fracture. Pt had another near syncopal episode tonight. SpO2 at home was 86% on room air - brought up 97% on 3L [place]. Pt A/Ox4 answering questions appropriately.

## 2023-11-10 NOTE — ED Provider Notes (Signed)
[facility] Provider Note    Event Date/Time   First MD Initiated Contact with Patient 11/10/23 1952     (approximate)   History   Near Syncope   HPI  Ryan Maxwell is a 38 y.o. male who presents to the ED for evaluation of Near Syncope   Review of routine PCP visit from November.  Morbidly obese with severe OSA.  Tobacco abuse.  Patient presents from home via EMS for evaluation of 1 syncopal episode and multiple episodes of dizziness in the past week.  8 days ago patient reports a syncopal episode at home and injured his right wrist.  Went to [facility] and diagnosed with a wrist fracture and he is currently in a splint.  Follows up with them tomorrow.  Also reports multiple episodes of dizziness and falling asleep while standing in the past week but no other syncopal episodes.  EMS reports finding him with sats in the high 80s that lowered to the high 70s with ambulation.  Patient denies any chest pain, shortness of breath or anything beyond feeling tired.  Reports he "mostly" uses his CPAP.  No fevers or recent illnesses.  No emesis or abdominal pain or back pain.   Physical Exam   Triage Vital Signs: ED Triage Vitals  Encounter Vitals Group     BP      Systolic BP Percentile      Diastolic BP Percentile      Pulse      Resp      Temp      Temp src      SpO2      Weight      Height      Head Circumference      Peak Flow      Pain Score      Pain Loc      Pain Education      Exclude from Growth Chart     Most recent vital signs: Vitals:   11/10/23 2050 11/10/23 2209  BP:    Pulse: 87   Resp: 17   Temp:  (!) 97.3 F (36.3 C)  SpO2: 96%     General: Awake, no distress.  Morbidly obese, pleasant and conversational. CV:  Good peripheral perfusion.  RRR Resp:  Normal effort.  Clear, no wheezing Abd:  No distention.  MSK:  No deformity noted.  Neuro:  No focal deficits appreciated. Other:     ED Results / Procedures /  Treatments   Labs (all labs ordered are listed, but only abnormal results are displayed) Labs Reviewed  D-DIMER, QUANTITATIVE - Abnormal; Notable for the following components:      Result Value   D-Dimer, Quant 0.70 (*)    All other components within normal limits  CBC WITH DIFFERENTIAL/PLATELET - Abnormal; Notable for the following components:   RBC 6.08 (*)    HCT 54.6 (*)    MCHC 28.9 (*)    RDW 17.2 (*)    nRBC 0.3 (*)    All other components within normal limits  COMPREHENSIVE METABOLIC PANEL - Abnormal; Notable for the following components:   Glucose, Bld 140 (*)    BUN 22 (*)    Calcium 7.7 (*)    Total Protein 6.3 (*)    Albumin 3.0 (*)    All other components within normal limits  TROPONIN I (HIGH SENSITIVITY)  TROPONIN I (HIGH SENSITIVITY)    EKG Sinus rhythm with a rate of 95  bpm.  Normal axis and intervals.  No for signs of acute ischemia.  RADIOLOGY CXR interpreted by me without evidence of acute cardiopulmonary pathology. CTA chest interpreted by me without large PE  Official radiology report(s): CT Angio Chest PE W and/or Wo Contrast Result Date: 11/10/2023 CLINICAL DATA:  Syncope and hypoxia. Evaluate for pulmonary embolus. Recent syncopal episodes and falls. EXAM: CT ANGIOGRAPHY CHEST WITH CONTRAST TECHNIQUE: Multidetector CT imaging of the chest was performed using the standard protocol during bolus administration of intravenous contrast. Multiplanar CT image reconstructions and MIPs were obtained to evaluate the vascular anatomy. RADIATION DOSE REDUCTION: This exam was performed according to the departmental dose-optimization program which includes automated exposure control, adjustment of the mA and/or kV according to patient size and/or use of iterative reconstruction technique. CONTRAST:  75mL OMNIPAQUE IOHEXOL 350 MG/ML SOLN COMPARISON:  Chest radiograph 11/10/2023.  CT chest 10/09/2022 FINDINGS: Cardiovascular: Technically adequate study with moderately good  opacification of the central and segmental pulmonary arteries. Mild motion artifact. No focal filling defects are identified. No evidence of significant pulmonary embolus. Cardiac enlargement. No pericardial effusions. Normal caliber thoracic aorta. Great vessel origins are patent. Mediastinum/Nodes: Esophagus is decompressed. Thyroid gland is unremarkable. Mediastinal and bilateral hilar lymphadenopathy. Right paratracheal lymph nodes measure up to 2.2 cm short axis dimension. Right hilar lymph nodes measure 2.5 cm. Left hilar lymph nodes measure 1.3 cm. Lymphadenopathy appears similar to the prior study. Lungs/Pleura: Motion artifact limits evaluation but there appears to be a mild diffuse mosaic attenuation pattern to the lungs. This could be due to motion artifact, air trapping, or edema. Multifocal pneumonia less likely. No pleural effusion or pneumothorax. Upper Abdomen: No acute abnormalities. Musculoskeletal: No acute abnormalities. Review of the MIP images confirms the above findings. IMPRESSION: 1. No evidence of significant pulmonary embolus. 2. Cardiac enlargement. 3. Mosaic attenuation pattern to the lungs could be due to motion artifact, air trapping, or edema. 4. Mediastinal and hilar lymphadenopathy is similar to prior study. This is nonspecific but probably reactive. Could also consider lymphoproliferative disorder in the appropriate clinical setting. Electronically Signed   By: Burman Nieves M.D.   On: 11/10/2023 22:15   DG Chest Port 1 View Result Date: 11/10/2023 CLINICAL DATA:  Hypoxia.  Recent syncopal episode with falls. EXAM: PORTABLE CHEST 1 VIEW COMPARISON:  10/22/2023 FINDINGS: Shallow inspiration. Cardiac enlargement. No vascular congestion, edema, or consolidation. No pleural effusion or pneumothorax. Mediastinal contours appear intact. IMPRESSION: Cardiac enlargement.  No evidence of active pulmonary disease. Electronically Signed   By: Burman Nieves M.D.   On: 11/10/2023 20:35     PROCEDURES and INTERVENTIONS:  .1-3 Lead EKG Interpretation  Performed by: Delton Prairie, MD Authorized by: Delton Prairie, MD     Interpretation: normal     ECG rate:  80   ECG rate assessment: normal     Rhythm: sinus rhythm     Ectopy: none     Conduction: normal     Medications  iohexol (OMNIPAQUE) 350 MG/ML injection 75 mL (75 mLs Intravenous Contrast Given 11/10/23 2201)     IMPRESSION / MDM / ASSESSMENT AND PLAN / ED COURSE  I reviewed the triage vital signs and the nursing notes.  Differential diagnosis includes, but is not limited to, obesity hypoventilation or OSA.  PE, seizure, cardiac dysrhythmia  {Patient presents with symptoms of an acute illness or injury that is potentially life-threatening.  Morbidly obese patient with OSA presents a week after a syncopal episode for evaluation.  Suspect majority of  his symptoms are related to obesity hypoventilation and OSA.  Has a positive D-dimer here so CTA chest obtained without signs of PE.  He is asymptomatic here.  We do have to provide him with one of our own CPAP's as he is falling asleep he does desaturate, but asymptomatic while awake.  Normal CBC.  2 negative troponins.  Reassuring metabolic panel.  Suitable for outpatient management with PCP follow-up though I considered observation admission for this patient.  Clinical Course as of 11/10/23 2301  Sun Nov 10, 2023  2300 Reassessed.  He has been on one of our CPAP machines and doing well.  His fiance is at the bedside, they confirmed to have a CPAP at home.  I discussed the importance of using this device, weight loss, OSA, etc.  Discussed ED return precautions. [DS]    Clinical Course User Index [DS] Delton Prairie, MD     FINAL CLINICAL IMPRESSION(S) / ED DIAGNOSES   Final diagnoses:  Near syncope     Rx / DC Orders   ED Discharge Orders     None        Note:  This document was prepared using Dragon voice recognition software and may include  unintentional dictation errors.   Delton Prairie, MD 11/10/23 [phone]

## 2023-11-14 ENCOUNTER — Ambulatory Visit: Payer: Managed Care, Other (non HMO) | Admitting: Family

## 2023-11-14 ENCOUNTER — Encounter: Payer: Self-pay | Admitting: Family

## 2023-11-14 VITALS — BP 122/88 | HR 100 | Ht 72.0 in | Wt 361.8 lb

## 2023-11-14 DIAGNOSIS — R55 Syncope and collapse: Secondary | ICD-10-CM

## 2023-11-14 DIAGNOSIS — I10 Essential (primary) hypertension: Secondary | ICD-10-CM | POA: Diagnosis not present

## 2023-11-14 DIAGNOSIS — E559 Vitamin D deficiency, unspecified: Secondary | ICD-10-CM | POA: Diagnosis not present

## 2023-11-14 DIAGNOSIS — R5383 Other fatigue: Secondary | ICD-10-CM

## 2023-11-14 DIAGNOSIS — E782 Mixed hyperlipidemia: Secondary | ICD-10-CM

## 2023-11-14 DIAGNOSIS — E1165 Type 2 diabetes mellitus with hyperglycemia: Secondary | ICD-10-CM

## 2023-11-14 DIAGNOSIS — E538 Deficiency of other specified B group vitamins: Secondary | ICD-10-CM

## 2023-11-14 MED ORDER — VITAMIN D (ERGOCALCIFEROL) 1.25 MG (50000 UNIT) PO CAPS: 50000.0000 [IU] | ORAL_CAPSULE | ORAL | 1 refills | Status: DC

## 2023-11-14 MED ORDER — HYDROCODONE-ACETAMINOPHEN 5-325 MG PO TABS: 1.0000 | ORAL_TABLET | Freq: Four times a day (QID) | ORAL | 0 refills | Status: DC | PRN

## 2023-11-14 MED ORDER — HYDROCHLOROTHIAZIDE 25 MG PO TABS: 25.0000 mg | ORAL_TABLET | Freq: Every day | ORAL | 1 refills | Status: DC

## 2023-11-14 NOTE — Progress Notes (Signed)
 ,  Established Patient Office Visit  Subjective:  Patient ID: Ryan Maxwell, male    DOB: 05/18/1986  Age: 38 y.o. MRN: [id number]  Chief Complaint  Patient presents with   Hospitalization Follow-up    Patient recently went to the ED with shortness of breath, cough, and wheezing.  He also had some dizziness and loss of consciousness.   He is here today accompanied by his significant other, says that he has been having some dizzy spells, but has not had any as bad as when he went to the ED.       No other concerns at this time.   Past Medical History:  Diagnosis Date   Acute bronchitis 11/21/2021   Acute on chronic respiratory failure with hypoxia (HCC) 12/12/2022   Acute respiratory failure due to COVID-19 [facility]) 12/12/2022   Acute respiratory failure with hypoxia (HCC) 03/06/2020   Acute respiratory failure with hypoxia and hypercapnia (HCC) 03/06/2020   Bronchitis    CAP (community acquired pneumonia) 04/22/2022   COVID-19 virus infection 03/06/2020   Diarrhea 07/03/2022   Hypertension    Lower respiratory tract infection due to COVID-19 virus 07/25/2021   Pneumonia    Tobacco abuse     History reviewed. No pertinent surgical history.  Social History   Socioeconomic History   Marital status: Single    Spouse name: Not on file   Number of children: 2   Years of education: Not on file   Highest education level: Not on file  Occupational History   Not on file  Tobacco Use   Smoking status: Former    Current packs/day: 0.00    Average packs/day: 1 pack/day for 16.0 years (16.0 ttl pk-yrs)    Types: Cigarettes    Start date: 12/2006    Quit date: 12/2022    Years since quitting: 1.0   Smokeless tobacco: Never  Vaping Use   Vaping status: Never Used  Substance and Sexual Activity   Alcohol use: Not Currently   Drug use: Yes    Types: Marijuana   Sexual activity: Yes  Other Topics Concern   Not on file  Social History Narrative   Not on file    Social Drivers of Health   Financial Resource Strain: Not on file  Food Insecurity: No Food Insecurity (07/03/2022)   Hunger Vital Sign    Worried About Running Out of Food in the Last Year: Never true    Ran Out of Food in the Last Year: Never true  Transportation Needs: No Transportation Needs (07/03/2022)   PRAPARE - Administrator, Civil Service (Medical): No    Lack of Transportation (Non-Medical): No  Physical Activity: Not on file  Stress: Not on file  Social Connections: Not on file  Intimate Partner Violence: Not At Risk (07/03/2022)   Humiliation, Afraid, Rape, and Kick questionnaire    Fear of Current or Ex-Partner: No    Emotionally Abused: No    Physically Abused: No    Sexually Abused: No    Family History  Problem Relation Age of Onset   Hypertension Mother    Hypothyroidism Mother    Hypertension Other     Allergies  Allergen Reactions   Dayquil [Pseudoephedrine-Apap-Dm] Shortness Of Breath   Nyquil Multi-Symptom [Pseudoeph-Doxylamine-Dm-Apap] Shortness Of Breath   Metformin  And Related Other (See Comments)    Swelling in bilateral legs, severe, causing mobility issues.     Review of Systems  Constitutional:  Positive for malaise/fatigue.  Respiratory:  Positive for cough.   Neurological:  Positive for dizziness.  All other systems reviewed and are negative.      Objective:   BP 122/88   Pulse 100   Ht 6' (1.829 m)   Wt (!) 361 lb 12.8 oz (164.1 kg)   SpO2 (!) 80%   BMI 49.07 kg/m   Vitals:   11/14/23 1100  BP: 122/88  Pulse: 100  Height: 6' (1.829 m)  Weight: (!) 361 lb 12.8 oz (164.1 kg)  SpO2: (!) 80%  BMI (Calculated): 49.06    Physical Exam Vitals and nursing note reviewed.  Constitutional:      Appearance: Normal appearance. He is normal weight.  Eyes:     Pupils: Pupils are equal, round, and reactive to light.  Cardiovascular:     Rate and Rhythm: Normal rate and regular rhythm.     Pulses: Normal pulses.      Heart sounds: Normal heart sounds.  Pulmonary:     Effort: Pulmonary effort is normal.     Breath sounds: Normal breath sounds.  Neurological:     General: No focal deficit present.     Mental Status: He is alert and oriented to person, place, and time.  Psychiatric:        Mood and Affect: Mood normal.        Behavior: Behavior normal.        Thought Content: Thought content normal.        Judgment: Judgment normal.      Results for orders placed or performed in visit on 11/14/23  Lipid panel  Result Value Ref Range   Cholesterol, Total 149 100 - 199 mg/dL   Triglycerides 856 0 - 149 mg/dL   HDL 40 >60 mg/dL   VLDL Cholesterol Cal 25 5 - 40 mg/dL   LDL Chol Calc (NIH) 84 0 - 99 mg/dL   Chol/HDL Ratio 3.7 0.0 - 5.0 ratio  VITAMIN D  25 Hydroxy (Vit-D Deficiency, Fractures)  Result Value Ref Range   Vit D, 25-Hydroxy 8.1 (L) 30.0 - 100.0 ng/mL  CMP14+EGFR  Result Value Ref Range   Glucose 104 (H) 70 - 99 mg/dL   BUN 13 6 - 20 mg/dL   Creatinine, Ser 9.15 0.76 - 1.27 mg/dL   eGFR 883 >40 fO/fpw/8.26   BUN/Creatinine Ratio 15 9 - 20   Sodium CANCELED mmol/L   Potassium CANCELED mmol/L   Chloride CANCELED mmol/L   CO2 28 20 - 29 mmol/L   Calcium  9.2 8.7 - 10.2 mg/dL   Total Protein 6.7 6.0 - 8.5 g/dL   Albumin 3.7 (L) 4.1 - 5.1 g/dL   Globulin, Total 3.0 1.5 - 4.5 g/dL   Bilirubin Total 0.6 0.0 - 1.2 mg/dL   Alkaline Phosphatase 76 44 - 121 IU/L   AST 32 0 - 40 IU/L   ALT 40 0 - 44 IU/L  TSH  Result Value Ref Range   TSH 6.580 (H) 0.450 - 4.500 uIU/mL  Hemoglobin A1c  Result Value Ref Range   Hgb A1c MFr Bld 6.9 (H) 4.8 - 5.6 %   Est. average glucose Bld gHb Est-mCnc 151 mg/dL  Vitamin B12  Result Value Ref Range   Vitamin B-12 488 232 - 1,245 pg/mL  CBC with Diff  Result Value Ref Range   WBC 8.7 3.4 - 10.8 x10E3/uL   RBC 6.34 (H) 4.14 - 5.80 x10E6/uL   Hemoglobin 16.4 13.0 - 17.7 g/dL   Hematocrit 43.5 (H) 62.4 -  51.0 %   MCV 89 79 - 97 fL   MCH 25.9 (L)  26.6 - 33.0 pg   MCHC 29.1 (L) 31.5 - 35.7 g/dL   RDW 85.0 88.3 - 84.5 %   Platelets 188 150 - 450 x10E3/uL   Neutrophils 53 Not Estab. %   Lymphs 27 Not Estab. %   Monocytes 17 Not Estab. %   Eos 1 Not Estab. %   Basos 1 Not Estab. %   Neutrophils Absolute 4.7 1.4 - 7.0 x10E3/uL   Lymphocytes Absolute 2.3 0.7 - 3.1 x10E3/uL   Monocytes Absolute 1.5 (H) 0.1 - 0.9 x10E3/uL   EOS (ABSOLUTE) 0.1 0.0 - 0.4 x10E3/uL   Basophils Absolute 0.1 0.0 - 0.2 x10E3/uL   Immature Granulocytes 1 Not Estab. %   Immature Grans (Abs) 0.1 0.0 - 0.1 x10E3/uL    Recent Results (from the past 2160 hours)  Basic metabolic panel     Status: None   Collection Time: 10/22/23  8:14 AM  Result Value Ref Range   Sodium 142 135 - 145 mmol/L   Potassium 4.0 3.5 - 5.1 mmol/L   Chloride 103 98 - 111 mmol/L   CO2 29 22 - 32 mmol/L   Glucose, Bld 96 70 - 99 mg/dL    Comment: Glucose reference range applies only to samples taken after fasting for at least 8 hours.   BUN 14 6 - 20 mg/dL   Creatinine, Ser 9.25 0.61 - 1.24 mg/dL   Calcium  9.0 8.9 - 10.3 mg/dL   GFR, Estimated >39 >39 mL/min    Comment: (NOTE) Calculated using the CKD-EPI Creatinine Equation (2021)    Anion gap 10 5 - 15    Comment: Performed at [facility], [address] [place], [place] [address]  CBC     Status: Abnormal   Collection Time: 10/22/23  8:14 AM  Result Value Ref Range   WBC 8.9 4.0 - 10.5 K/uL   RBC 6.58 (H) 4.22 - 5.81 MIL/uL   Hemoglobin 17.1 (H) 13.0 - 17.0 g/dL   HCT 41.7 (H) 60.9 - 47.9 %    Comment: REPEATED TO VERIFY   MCV 88.4 80.0 - 100.0 fL   MCH 26.0 26.0 - 34.0 pg   MCHC 29.4 (L) 30.0 - 36.0 g/dL   RDW 82.7 (H) 88.4 - 84.4 %   Platelets 194 150 - 400 K/uL   nRBC 0.0 0.0 - 0.2 %    Comment: Performed at [facility], [address], [place], [place] [address]  Troponin I (High Sensitivity)     Status: None   Collection Time: 10/22/23  8:14 AM  Result Value Ref Range   Troponin I (High  Sensitivity) 16 <18 ng/L    Comment: (NOTE) Elevated high sensitivity troponin I (hsTnI) values and significant  changes across serial measurements may suggest ACS but many other  chronic and acute conditions are known to elevate hsTnI results.  Refer to the Links section for chest pain algorithms and additional  guidance. Performed at [facility], [address] [place], [place] [address]   Troponin I (High Sensitivity)     Status: None   Collection Time: 10/22/23 11:32 AM  Result Value Ref Range   Troponin I (High Sensitivity) 16 <18 ng/L    Comment: (NOTE) Elevated high sensitivity troponin I (hsTnI) values and significant  changes across serial measurements may suggest ACS but many other  chronic and acute conditions are known to elevate hsTnI results.  Refer to the Links section for chest pain algorithms and additional  guidance. Performed at [facility], [address] [place], [place] [address]   D-dimer, quantitative     Status: Abnormal   Collection Time: 11/10/23  8:26 PM  Result Value Ref Range   D-Dimer, Quant 0.70 (H) 0.00 - 0.50 ug/mL-FEU    Comment: (NOTE) At the manufacturer cut-off value of 0.5 g/mL FEU, this assay has a negative predictive value of 95-100%.This assay is intended for use in conjunction with a clinical pretest probability (PTP) assessment model to exclude pulmonary embolism (PE) and deep venous thrombosis (DVT) in outpatients suspected of PE or DVT. Results should be correlated with clinical presentation. Performed at [facility], [address] [place], [place] [address]   CBC with Differential/Platelet     Status: Abnormal   Collection Time: 11/10/23  8:26 PM  Result Value Ref Range   WBC 7.0 4.0 - 10.5 K/uL   RBC 6.08 (H) 4.22 - 5.81 MIL/uL   Hemoglobin 15.8 13.0 - 17.0 g/dL   HCT 45.3 (H) 60.9 - 47.9 %   MCV 89.8 80.0 - 100.0 fL   MCH 26.0 26.0 - 34.0 pg   MCHC 28.9 (L) 30.0 - 36.0 g/dL   RDW  82.7 (H) 88.4 - 15.5 %   Platelets 159 150 - 400 K/uL   nRBC 0.3 (H) 0.0 - 0.2 %   Neutrophils Relative % 60 %   Neutro Abs 4.3 1.7 - 7.7 K/uL   Lymphocytes Relative 21 %   Lymphs Abs 1.4 0.7 - 4.0 K/uL   Monocytes Relative 15 %   Monocytes Absolute 1.0 0.1 - 1.0 K/uL   Eosinophils Relative 2 %   Eosinophils Absolute 0.1 0.0 - 0.5 K/uL   Basophils Relative 1 %   Basophils Absolute 0.0 0.0 - 0.1 K/uL   Immature Granulocytes 1 %   Abs Immature Granulocytes 0.05 0.00 - 0.07 K/uL    Comment: Performed at [facility], [address] [place], [place] [address]  Comprehensive metabolic panel     Status: Abnormal   Collection Time: 11/10/23  8:26 PM  Result Value Ref Range   Sodium 142 135 - 145 mmol/L   Potassium 3.7 3.5 - 5.1 mmol/L   Chloride 102 98 - 111 mmol/L   CO2 31 22 - 32 mmol/L   Glucose, Bld 140 (H) 70 - 99 mg/dL    Comment: Glucose reference range applies only to samples taken after fasting for at least 8 hours.   BUN 22 (H) 6 - 20 mg/dL   Creatinine, Ser 9.21 0.61 - 1.24 mg/dL   Calcium  7.7 (L) 8.9 - 10.3 mg/dL   Total Protein 6.3 (L) 6.5 - 8.1 g/dL   Albumin 3.0 (L) 3.5 - 5.0 g/dL   AST 24 15 - 41 U/L   ALT 31 0 - 44 U/L   Alkaline Phosphatase 45 38 - 126 U/L   Total Bilirubin 0.9 0.0 - 1.2 mg/dL   GFR, Estimated >39 >39 mL/min    Comment: (NOTE) Calculated using the CKD-EPI Creatinine Equation (2021)    Anion gap 9 5 - 15    Comment: Performed at [facility], [address] [place], [place] [address]  Troponin I (High Sensitivity)     Status: None   Collection Time: 11/10/23  8:26 PM  Result Value Ref Range   Troponin I (High Sensitivity) 15 <18 ng/L    Comment: (NOTE) Elevated high sensitivity troponin  I (hsTnI) values and significant  changes across serial measurements may suggest ACS but many other  chronic and acute conditions are known to elevate hsTnI results.  Refer to the Links section for chest pain algorithms and additional   guidance. Performed at [facility], [address] [place], [place] [address]   Troponin I (High Sensitivity)     Status: None   Collection Time: 11/10/23 10:13 PM  Result Value Ref Range   Troponin I (High Sensitivity) 15 <18 ng/L    Comment: (NOTE) Elevated high sensitivity troponin I (hsTnI) values and significant  changes across serial measurements may suggest ACS but many other  chronic and acute conditions are known to elevate hsTnI results.  Refer to the Links section for chest pain algorithms and additional  guidance. Performed at [facility], [address] [place], [place] [address]   Lipid panel     Status: None   Collection Time: 11/14/23 11:56 AM  Result Value Ref Range   Cholesterol, Total 149 100 - 199 mg/dL   Triglycerides 856 0 - 149 mg/dL   HDL 40 >60 mg/dL   VLDL Cholesterol Cal 25 5 - 40 mg/dL   LDL Chol Calc (NIH) 84 0 - 99 mg/dL   Chol/HDL Ratio 3.7 0.0 - 5.0 ratio    Comment:                                   T. Chol/HDL Ratio                                             Men  Women                               1/2 Avg.Risk  3.4    3.3                                   Avg.Risk  5.0    4.4                                2X Avg.Risk  9.6    7.1                                3X Avg.Risk 23.4   11.0   VITAMIN D  25 Hydroxy (Vit-D Deficiency, Fractures)     Status: Abnormal   Collection Time: 11/14/23 11:56 AM  Result Value Ref Range   Vit D, 25-Hydroxy 8.1 (L) 30.0 - 100.0 ng/mL    Comment: Vitamin D  deficiency has been defined by the Institute of Medicine and an Endocrine Society practice guideline as a level of serum 25-OH vitamin D  less than 20 ng/mL (1,2). The Endocrine Society went on to further define vitamin D  insufficiency as a level between 21 and 29 ng/mL (2). 1. IOM (Institute of Medicine). 2010. Dietary reference    intakes for calcium  and D. [place]: The    [employer]. 2. Holick MF, Binkley  [place], Bischoff-Ferrari HA, et al.    Evaluation, treatment, and  prevention of vitamin D     deficiency: an Endocrine Society clinical practice    guideline. JCEM. 2011 Jul; 96(7):1911-30.   CMP14+EGFR     Status: Abnormal   Collection Time: 11/14/23 11:56 AM  Result Value Ref Range   Glucose 104 (H) 70 - 99 mg/dL   BUN 13 6 - 20 mg/dL   Creatinine, Ser 9.15 0.76 - 1.27 mg/dL   eGFR 883 >40 fO/fpw/8.26   BUN/Creatinine Ratio 15 9 - 20   Sodium CANCELED mmol/L    Comment: [facility] was unable to collect sufficient specimen to perform the following test(s), and is providing the patient with re-collection instructions.  Result canceled by the ancillary.    Potassium CANCELED mmol/L    Comment: [facility] was unable to collect sufficient specimen to perform the following test(s), and is providing the patient with re-collection instructions.  Result canceled by the ancillary.    Chloride CANCELED mmol/L    Comment: [facility] was unable to collect sufficient specimen to perform the following test(s), and is providing the patient with re-collection instructions.  Result canceled by the ancillary.    CO2 28 20 - 29 mmol/L   Calcium  9.2 8.7 - 10.2 mg/dL   Total Protein 6.7 6.0 - 8.5 g/dL   Albumin 3.7 (L) 4.1 - 5.1 g/dL   Globulin, Total 3.0 1.5 - 4.5 g/dL   Bilirubin Total 0.6 0.0 - 1.2 mg/dL   Alkaline Phosphatase 76 44 - 121 IU/L   AST 32 0 - 40 IU/L   ALT 40 0 - 44 IU/L  TSH     Status: Abnormal   Collection Time: 11/14/23 11:56 AM  Result Value Ref Range   TSH 6.580 (H) 0.450 - 4.500 uIU/mL  Hemoglobin A1c     Status: Abnormal   Collection Time: 11/14/23 11:56 AM  Result Value Ref Range   Hgb A1c MFr Bld 6.9 (H) 4.8 - 5.6 %    Comment:          Prediabetes: 5.7 - 6.4          Diabetes: >6.4          Glycemic control for adults with diabetes: <7.0    Est. average glucose Bld gHb Est-mCnc 151 mg/dL  Vitamin B12     Status: None   Collection Time: 11/14/23 11:56 AM  Result  Value Ref Range   Vitamin B-12 488 232 - 1,245 pg/mL  CBC with Diff     Status: Abnormal   Collection Time: 11/14/23 11:56 AM  Result Value Ref Range   WBC 8.7 3.4 - 10.8 x10E3/uL   RBC 6.34 (H) 4.14 - 5.80 x10E6/uL   Hemoglobin 16.4 13.0 - 17.7 g/dL   Hematocrit 43.5 (H) 62.4 - 51.0 %   MCV 89 79 - 97 fL   MCH 25.9 (L) 26.6 - 33.0 pg   MCHC 29.1 (L) 31.5 - 35.7 g/dL   RDW 85.0 88.3 - 84.5 %   Platelets 188 150 - 450 x10E3/uL   Neutrophils 53 Not Estab. %   Lymphs 27 Not Estab. %   Monocytes 17 Not Estab. %   Eos 1 Not Estab. %   Basos 1 Not Estab. %   Neutrophils Absolute 4.7 1.4 - 7.0 x10E3/uL   Lymphocytes Absolute 2.3 0.7 - 3.1 x10E3/uL   Monocytes Absolute 1.5 (H) 0.1 - 0.9 x10E3/uL   EOS (ABSOLUTE) 0.1 0.0 - 0.4 x10E3/uL   Basophils Absolute 0.1 0.0 - 0.2 x10E3/uL   Immature Granulocytes 1 Not  Estab. %   Immature Grans (Abs) 0.1 0.0 - 0.1 x10E3/uL       Assessment & Plan:   Problem List Items Addressed This Visit       Cardiovascular and Mediastinum   Essential hypertension   Relevant Orders   CMP14+EGFR (Completed)   CBC with Diff (Completed)     Endocrine   Type 2 diabetes mellitus with hyperglycemia, without long-term current use of insulin  (HCC)   Relevant Orders   CMP14+EGFR (Completed)   Hemoglobin A1c (Completed)   CBC with Diff (Completed)     Other   Vitamin D  deficiency, unspecified - Primary   Relevant Orders   VITAMIN D  25 Hydroxy (Vit-D Deficiency, Fractures) (Completed)   CMP14+EGFR (Completed)   CBC with Diff (Completed)   Other Visit Diagnoses       Mixed hyperlipidemia       Checking labs today.  Continue current therapy for lipid control. Will modify as needed based on labwork results.   Relevant Orders   Lipid panel (Completed)   CMP14+EGFR (Completed)   CBC with Diff (Completed)     B12 deficiency due to diet       Checking labs today.  Will continue supplements as needed.   Relevant Orders   CMP14+EGFR (Completed)    Vitamin B12 (Completed)   CBC with Diff (Completed)     Other fatigue       Relevant Orders   CMP14+EGFR (Completed)   TSH (Completed)   CBC with Diff (Completed)     Syncope and collapse       Carotid Doppler ordered today.  Will schedule and will contact pt. with results.       Return in about 2 weeks (around 11/28/2023) for F/U.   Total time spent: 30 minutes  ALAN CHRISTELLA ARRANT, FNP  11/14/2023   This document may have been prepared by [facility] Voice Recognition software and as such may include unintentional dictation errors.

## 2023-11-16 LAB — CBC WITH DIFFERENTIAL/PLATELET
Basophils Absolute: 0.1 10*3/uL (ref 0.0–0.2)
Basos: 1 %
EOS (ABSOLUTE): 0.1 10*3/uL (ref 0.0–0.4)
Eos: 1 %
Hematocrit: 56.4 % — ABNORMAL HIGH (ref 37.5–51.0)
Hemoglobin: 16.4 g/dL (ref 13.0–17.7)
Immature Grans (Abs): 0.1 10*3/uL (ref 0.0–0.1)
Immature Granulocytes: 1 %
Lymphocytes Absolute: 2.3 10*3/uL (ref 0.7–3.1)
Lymphs: 27 %
MCH: 25.9 pg — ABNORMAL LOW (ref 26.6–33.0)
MCHC: 29.1 g/dL — ABNORMAL LOW (ref 31.5–35.7)
MCV: 89 fL (ref 79–97)
Monocytes Absolute: 1.5 10*3/uL — ABNORMAL HIGH (ref 0.1–0.9)
Monocytes: 17 %
Neutrophils Absolute: 4.7 10*3/uL (ref 1.4–7.0)
Neutrophils: 53 %
Platelets: 188 10*3/uL (ref 150–450)
RBC: 6.34 x10E6/uL — ABNORMAL HIGH (ref 4.14–5.80)
RDW: 14.9 % (ref 11.6–15.4)
WBC: 8.7 10*3/uL (ref 3.4–10.8)

## 2023-11-16 LAB — VITAMIN B12: Vitamin B-12: 488 pg/mL (ref 232–1245)

## 2023-11-16 LAB — CMP14+EGFR
ALT: 40 [IU]/L (ref 0–44)
AST: 32 [IU]/L (ref 0–40)
Albumin: 3.7 g/dL — ABNORMAL LOW (ref 4.1–5.1)
Alkaline Phosphatase: 76 [IU]/L (ref 44–121)
BUN/Creatinine Ratio: 15 (ref 9–20)
BUN: 13 mg/dL (ref 6–20)
Bilirubin Total: 0.6 mg/dL (ref 0.0–1.2)
CO2: 28 mmol/L (ref 20–29)
Calcium: 9.2 mg/dL (ref 8.7–10.2)
Creatinine, Ser: 0.84 mg/dL (ref 0.76–1.27)
Globulin, Total: 3 g/dL (ref 1.5–4.5)
Glucose: 104 mg/dL — ABNORMAL HIGH (ref 70–99)
Total Protein: 6.7 g/dL (ref 6.0–8.5)
eGFR: 116 mL/min/{1.73_m2} (ref >59)

## 2023-11-16 LAB — TSH: TSH: 6.58 u[IU]/mL — ABNORMAL HIGH (ref 0.450–4.500)

## 2023-11-16 LAB — LIPID PANEL
Chol/HDL Ratio: 3.7 {ratio} (ref 0.0–5.0)
Cholesterol, Total: 149 mg/dL (ref 100–199)
HDL: 40 mg/dL (ref >39)
LDL Chol Calc (NIH): 84 mg/dL (ref 0–99)
Triglycerides: 143 mg/dL (ref 0–149)
VLDL Cholesterol Cal: 25 mg/dL (ref 5–40)

## 2023-11-16 LAB — VITAMIN D 25 HYDROXY (VIT D DEFICIENCY, FRACTURES): Vit D, 25-Hydroxy: 8.1 ng/mL — ABNORMAL LOW (ref 30.0–100.0)

## 2023-11-16 LAB — HEMOGLOBIN A1C
Est. average glucose Bld gHb Est-mCnc: 151 mg/dL
Hgb A1c MFr Bld: 6.9 % — ABNORMAL HIGH (ref 4.8–5.6)

## 2023-11-20 ENCOUNTER — Other Ambulatory Visit: Payer: Managed Care, Other (non HMO)

## 2023-11-28 ENCOUNTER — Ambulatory Visit: Payer: Managed Care, Other (non HMO) | Admitting: Family

## 2023-12-07 DEATH — deceased

## 2023-12-29 ENCOUNTER — Encounter: Payer: Self-pay | Admitting: Family
